# Patient Record
Sex: Male | Born: 1970
Health system: Southern US, Community
[De-identification: ages and names within clinical notes are randomized; demographics above are authoritative.]

## PROBLEM LIST (undated history)

## (undated) DIAGNOSIS — G629 Polyneuropathy, unspecified: Secondary | ICD-10-CM

## (undated) DIAGNOSIS — G4733 Obstructive sleep apnea (adult) (pediatric): Secondary | ICD-10-CM

## (undated) DIAGNOSIS — M1A00X0 Idiopathic chronic gout, unspecified site, without tophus (tophi): Secondary | ICD-10-CM

## (undated) DIAGNOSIS — M1A9XX0 Chronic gout, unspecified, without tophus (tophi): Secondary | ICD-10-CM

## (undated) DIAGNOSIS — I10 Essential (primary) hypertension: Secondary | ICD-10-CM

## (undated) DIAGNOSIS — Z9989 Dependence on other enabling machines and devices: Secondary | ICD-10-CM

## (undated) DIAGNOSIS — M1990 Unspecified osteoarthritis, unspecified site: Secondary | ICD-10-CM

## (undated) DIAGNOSIS — I509 Heart failure, unspecified: Secondary | ICD-10-CM

## (undated) HISTORY — DX: Chronic gout, unspecified, without tophus (tophi): M1A.9XX0

## (undated) HISTORY — DX: Idiopathic chronic gout, unspecified site, without tophus (tophi): M1A.00X0

## (undated) HISTORY — DX: Obstructive sleep apnea (adult) (pediatric): G47.33

## (undated) HISTORY — DX: Dependence on other enabling machines and devices: Z99.89

## (undated) HISTORY — DX: Unspecified osteoarthritis, unspecified site: M19.90

## (undated) HISTORY — DX: Polyneuropathy, unspecified: G62.9

---
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
## 1998-06-19 ENCOUNTER — Encounter: Payer: Self-pay | Admitting: Emergency Medicine

## 1998-06-19 ENCOUNTER — Emergency Department (HOSPITAL_COMMUNITY): Admission: EM | Admit: 1998-06-19 | Discharge: 1998-06-19 | Payer: Self-pay | Admitting: Emergency Medicine

## 1998-08-26 ENCOUNTER — Emergency Department (HOSPITAL_COMMUNITY): Admission: EM | Admit: 1998-08-26 | Discharge: 1998-08-26 | Payer: Self-pay | Admitting: Emergency Medicine

## 1998-09-10 ENCOUNTER — Ambulatory Visit (HOSPITAL_BASED_OUTPATIENT_CLINIC_OR_DEPARTMENT_OTHER): Admission: RE | Admit: 1998-09-10 | Discharge: 1998-09-10 | Payer: Self-pay | Admitting: General Surgery

## 2002-03-08 HISTORY — PX: I & D EXTREMITY: SHX5045

## 2004-11-17 ENCOUNTER — Ambulatory Visit (HOSPITAL_COMMUNITY): Admission: RE | Admit: 2004-11-17 | Discharge: 2004-11-17 | Payer: Self-pay | Admitting: General Surgery

## 2007-09-25 ENCOUNTER — Encounter (HOSPITAL_COMMUNITY): Admission: RE | Admit: 2007-09-25 | Discharge: 2007-10-25 | Payer: Self-pay | Admitting: Pediatrics

## 2008-03-08 HISTORY — PX: VASECTOMY: SHX75

## 2008-08-19 ENCOUNTER — Ambulatory Visit (HOSPITAL_BASED_OUTPATIENT_CLINIC_OR_DEPARTMENT_OTHER): Admission: RE | Admit: 2008-08-19 | Discharge: 2008-08-19 | Payer: Self-pay | Admitting: Urology

## 2008-08-19 ENCOUNTER — Encounter: Payer: Self-pay | Admitting: Urology

## 2010-06-15 LAB — POCT I-STAT 4, (NA,K, GLUC, HGB,HCT)
Glucose, Bld: 91 mg/dL (ref 70–99)
HCT: 48 % (ref 39.0–52.0)
Hemoglobin: 16.3 g/dL (ref 13.0–17.0)
Potassium: 4.1 mEq/L (ref 3.5–5.1)
Sodium: 140 mEq/L (ref 135–145)

## 2010-07-21 NOTE — Op Note (Signed)
Willie Daniel, Willie Daniel               ACCOUNT NO.:  [id number]   MEDICAL RECORD NO.:  [id number]          PATIENT TYPE:  AMB   LOCATION:  [facility]                         FACILITY:  [facility]   PHYSICIAN:  Bertram Millard. Dahlstedt, M.D.DATE OF BIRTH:  1971/01/18   DATE OF PROCEDURE:  08/19/2008  DATE OF DISCHARGE:                               OPERATIVE REPORT   PREOPERATIVE DIAGNOSIS:  Undesired fertility.   POSTOPERATIVE DIAGNOSIS:  Undesired fertility.   SURGICAL PROCEDURES:  Bilateral vasectomy.   SURGEON:  Bertram Millard. Dahlstedt, M.D.   ANESTHESIA:  General with LMA.   COMPLICATIONS:  None.   BRIEF HISTORY:  A 40 year old male who presented last week for vasectomy  consultation.  Exam revealed a very tight scrotum with his testicles up  in the inguinal canal.  It was recommended that he have this done under  general anesthetic, as it would be quite uncomfortable and a difficult  procedure in the office.  Risks and complications of the procedure, as  well as vasectomy were discussed with the patient.  He understands these  and desires to proceed.   DESCRIPTION OF PROCEDURE:  The patient was identified in the holding  area taken to the operating room where general anesthetic was  administered using the LMA.  Preoperative IV antibiotics had been  administered.  His genitalia and perineum were prepped and draped.  Both  vas were palpable.  The testicles were actually down on the top of the  scrotum.  I then made a small puncture wound in the middle of the  scrotum with no scalpel vasectomy hemostat.  I then placed the no  scalpel clamp through the small puncture wound and grasped the right vas  deferens.  It was brought through the small puncture wound.  The vas  deferens was skeletonized, for approximately 2 cm.  Both proximal and  distal extent of the vas was clamped.  The vas in between was then  removed.  Zero Vicryl sutures were used to ligate the vas proximally and  distally.  The  free ends were then buried in the dartos fascia using a 4-  0 chromic.  The same procedure was done with a left vas deferens after  the right side had been returned to the right hemiscrotum.  The proximal  and distal ends of both vasa were infiltrated with 0.5% plain Marcaine  prior to returning to the hemiscrotum.  After the left vas was  surgically ligated, both vasa were sent to pathology for gross only  inspection.  Following this, the small puncture wound was pinched shut,  and gauze was placed over the midline puncture site.  A scrotal support  was then placed.   The patient tolerated the procedure well.  He was awakened and taken to  the PACU in stable condition.   He will follow-up in approximately 1 week.      Bertram Millard. Dahlstedt, M.D.  Electronically Signed     SMD/MEDQ  D:  08/19/2008  T:  08/19/2008  Job:  [id number]

## 2011-10-12 DIAGNOSIS — Q203 Discordant ventriculoarterial connection: Secondary | ICD-10-CM | POA: Insufficient documentation

## 2012-12-05 ENCOUNTER — Other Ambulatory Visit (HOSPITAL_COMMUNITY): Payer: Self-pay | Admitting: Orthopedic Surgery

## 2012-12-05 DIAGNOSIS — M25562 Pain in left knee: Secondary | ICD-10-CM

## 2012-12-07 ENCOUNTER — Ambulatory Visit (HOSPITAL_COMMUNITY)
Admission: RE | Admit: 2012-12-07 | Discharge: 2012-12-07 | Disposition: A | Discharge status: Home / Self Care | Payer: BC Managed Care – PPO | Source: Ambulatory Visit | Attending: Orthopedic Surgery | Admitting: Orthopedic Surgery

## 2012-12-07 DIAGNOSIS — M25562 Pain in left knee: Secondary | ICD-10-CM

## 2012-12-07 DIAGNOSIS — M7120 Synovial cyst of popliteal space [Baker], unspecified knee: Secondary | ICD-10-CM | POA: Insufficient documentation

## 2012-12-07 DIAGNOSIS — M2240 Chondromalacia patellae, unspecified knee: Secondary | ICD-10-CM | POA: Insufficient documentation

## 2012-12-07 DIAGNOSIS — M23329 Other meniscus derangements, posterior horn of medial meniscus, unspecified knee: Secondary | ICD-10-CM | POA: Insufficient documentation

## 2012-12-07 DIAGNOSIS — M25469 Effusion, unspecified knee: Secondary | ICD-10-CM | POA: Insufficient documentation

## 2012-12-07 DIAGNOSIS — M25569 Pain in unspecified knee: Secondary | ICD-10-CM | POA: Insufficient documentation

## 2012-12-12 ENCOUNTER — Encounter (HOSPITAL_BASED_OUTPATIENT_CLINIC_OR_DEPARTMENT_OTHER): Payer: Self-pay | Admitting: *Deleted

## 2012-12-12 NOTE — Progress Notes (Signed)
Pt had heart surgery age 52goes [facility] yearly for ck Had labs 2 weeks ago [facility]- Denies sleep apnea

## 2012-12-13 NOTE — H&P (Signed)
Eulogio Requena/[facility] [address]   [address] [place], [place] [address] [phone] A Division of [facility]  Loreta Ave, M.D.   Robert A. Thurston Hole, M.D.   Burnell Blanks, M.D.   Eulas Post, M.D.   Lunette Stands, M.D Jewel Baize. Eulah Pont, M.D.  Buford Dresser, M.D.  Charlsie Quest, M.D.  Estell Harpin, M.D.   Melina Fiddler, M.D. Danford Bad. Willa Rough, PA-C  Kirstin A. Shepperson, PA-C  Josh [place], PA-C [place], [place]   RE: Willie Daniel, Willie Daniel   [id number]      DOB: 07-15-1970 PROGRESS NOTE: 12-05-12 Willie Daniel is a 42 year old male who presents with the complaint of left knee pain.  He describes the onset as several years ago with intermittent exacerbations of pain.  He had one over the weekend for which [facility].  He reports his pain as ranging from 4-10/10 on the pain scale.  He has been taking Norco for pain relief.  He feels that he has had several corticosteroid injections in the past which have gradually improved his pain; however, it continues to recur on somewhat of a random basis.  He had a fair amount of swelling which subsequently subsided.    Past Medical, Family and Social History are reviewed on the chart. His Review of Systems as per HPI; otherwise negative. He has no known drug allergies.    EXAMINATION: This is a well-nourished, well-developed, 42 year old white male, awake, alert and oriented in no acute distress.  His blood pressure 120/81, P 75.  Examination of the left knee reveals point tenderness over the medial joint line with no evidence of effusion.  He has a positive McMurray's.  No evidence of ligamentous laxity to varus or valgus stress.  He has a negative Lachman and negative anterior Drawer.  X-RAYS: 4 view x-rays of the left knee are negative.    IMPRESSION: Probable left medial meniscal injury.   DISPOSITION: MRI is being ordered.  He will follow-up  after the MRI for further evaluation.   Loreta Ave, M.D.  Electronically verified by Loreta Ave, M.D. DFM(TM):gde D 12-05-12 T 12-05-12  Jawad Wiacek/[facility] [address]   [address] [place], [place] [address] [phone] A Division of [facility]  Loreta Ave, M.D.   Robert A. Thurston Hole, M.D.   Burnell Blanks, M.D.   Eulas Post, M.D.   Lunette Stands, M.D Jewel Baize. Eulah Pont, M.D.  Buford Dresser, M.D.  Charlsie Quest, M.D.  Estell Harpin, M.D.   Melina Fiddler, M.D. Danford Bad. Willa Rough, MD.  Kirstin A. Shepperson, PA-C  Josh Cass[place]aga, PA-C [place], [place]   RE: Willie Daniel, Willie Daniel   [id number]      DOB: 01/31/1971 PROGRESS NOTE: 12-12-12 Willie Daniel comes in for follow-up. Left knee MRI complete. Persistent worsening symptoms. MRI showing medial meniscus tear. This is where his symptoms are. I went through workup and treatment to date. I went over the MRI report and scan itself and discussed it with him.  DISPOSITION: More than 25 minutes spent covering this with him. Proceed with exam under anesthesia arthroscopy meniscectomy. I am hoping we are not going to find much in the way of other changes. He understands and agrees and wants to proceed. All paperwork complete all questions answered.  Loreta Ave, M.D.  Electronically verified by Loreta Ave, M.D. DFM:kah D 12-12-12 T 12-13-12

## 2012-12-14 ENCOUNTER — Encounter (HOSPITAL_BASED_OUTPATIENT_CLINIC_OR_DEPARTMENT_OTHER)
Admission: RE | Disposition: A | Discharge status: Home / Self Care | Payer: Self-pay | Source: Ambulatory Visit | Attending: Orthopedic Surgery

## 2012-12-14 ENCOUNTER — Ambulatory Visit (HOSPITAL_BASED_OUTPATIENT_CLINIC_OR_DEPARTMENT_OTHER): Payer: BC Managed Care – PPO | Admitting: Anesthesiology

## 2012-12-14 ENCOUNTER — Encounter (HOSPITAL_BASED_OUTPATIENT_CLINIC_OR_DEPARTMENT_OTHER): Payer: Self-pay | Admitting: *Deleted

## 2012-12-14 ENCOUNTER — Ambulatory Visit (HOSPITAL_BASED_OUTPATIENT_CLINIC_OR_DEPARTMENT_OTHER)
Admission: RE | Admit: 2012-12-14 | Discharge: 2012-12-14 | Disposition: A | Discharge status: Home / Self Care | Payer: BC Managed Care – PPO | Source: Ambulatory Visit | Attending: Orthopedic Surgery | Admitting: Orthopedic Surgery

## 2012-12-14 ENCOUNTER — Encounter (HOSPITAL_BASED_OUTPATIENT_CLINIC_OR_DEPARTMENT_OTHER): Payer: BC Managed Care – PPO | Admitting: Anesthesiology

## 2012-12-14 DIAGNOSIS — M25562 Pain in left knee: Secondary | ICD-10-CM

## 2012-12-14 DIAGNOSIS — M23329 Other meniscus derangements, posterior horn of medial meniscus, unspecified knee: Secondary | ICD-10-CM | POA: Insufficient documentation

## 2012-12-14 DIAGNOSIS — M1A00X1 Idiopathic chronic gout, unspecified site, with tophus (tophi): Secondary | ICD-10-CM | POA: Insufficient documentation

## 2012-12-14 DIAGNOSIS — I10 Essential (primary) hypertension: Secondary | ICD-10-CM | POA: Insufficient documentation

## 2012-12-14 DIAGNOSIS — M23349 Other meniscus derangements, anterior horn of lateral meniscus, unspecified knee: Secondary | ICD-10-CM | POA: Insufficient documentation

## 2012-12-14 HISTORY — DX: Essential (primary) hypertension: I10

## 2012-12-14 HISTORY — PX: KNEE ARTHROSCOPY WITH MEDIAL MENISECTOMY: SHX5651

## 2012-12-14 LAB — POCT I-STAT, CHEM 8
Calcium, Ion: 1.14 mmol/L (ref 1.12–1.23)
Creatinine, Ser: 1 mg/dL (ref 0.50–1.35)
Glucose, Bld: 96 mg/dL (ref 70–99)
Hemoglobin: 14.6 g/dL (ref 13.0–17.0)
Potassium: 3.9 mEq/L (ref 3.5–5.1)

## 2012-12-14 SURGERY — ARTHROSCOPY, KNEE, WITH MEDIAL MENISCECTOMY
Anesthesia: General | Site: Knee | Laterality: Left | Wound class: Clean

## 2012-12-14 MED ORDER — DEXTROSE 5 % IV SOLN
3.0000 g | INTRAVENOUS | Status: AC
  Administered 2012-12-14: 3 g via INTRAVENOUS

## 2012-12-14 MED ORDER — MIDAZOLAM HCL 2 MG/2ML IJ SOLN
0.5000 mg | Freq: Once | INTRAMUSCULAR | Status: AC | PRN
  Administered 2012-12-14: 0.5 mg via INTRAVENOUS

## 2012-12-14 MED ORDER — METHYLPREDNISOLONE ACETATE 80 MG/ML IJ SUSP
INTRAMUSCULAR | Status: DC | PRN
  Administered 2012-12-14: 80 mg via INTRA_ARTICULAR

## 2012-12-14 MED ORDER — OXYCODONE-ACETAMINOPHEN 10-325 MG PO TABS: 1.0000 | ORAL_TABLET | ORAL | Status: DC | PRN

## 2012-12-14 MED ORDER — ACETAMINOPHEN 500 MG PO TABS
1000.0000 mg | ORAL_TABLET | Freq: Once | ORAL | Status: AC
  Administered 2012-12-14: 1000 mg via ORAL

## 2012-12-14 MED ORDER — OXYCODONE HCL 5 MG PO TABS
5.0000 mg | ORAL_TABLET | Freq: Once | ORAL | Status: AC | PRN
  Administered 2012-12-14: 5 mg via ORAL

## 2012-12-14 MED ORDER — MIDAZOLAM HCL 5 MG/5ML IJ SOLN
INTRAMUSCULAR | Status: DC | PRN
  Administered 2012-12-14: 2 mg via INTRAVENOUS

## 2012-12-14 MED ORDER — LIDOCAINE HCL (CARDIAC) 20 MG/ML IV SOLN
INTRAVENOUS | Status: DC | PRN
  Administered 2012-12-14: 100 mg via INTRAVENOUS

## 2012-12-14 MED ORDER — ONDANSETRON HCL 4 MG/2ML IJ SOLN
INTRAMUSCULAR | Status: DC | PRN
  Administered 2012-12-14: 4 mg via INTRAMUSCULAR

## 2012-12-14 MED ORDER — DEXAMETHASONE SODIUM PHOSPHATE 4 MG/ML IJ SOLN
INTRAMUSCULAR | Status: DC | PRN
  Administered 2012-12-14: 10 mg via INTRAVENOUS

## 2012-12-14 MED ORDER — OXYCODONE HCL 5 MG/5ML PO SOLN: 5.0000 mg | Freq: Once | ORAL | Status: AC | PRN

## 2012-12-14 MED ORDER — HYDROMORPHONE HCL PF 1 MG/ML IJ SOLN
0.2500 mg | INTRAMUSCULAR | Status: DC | PRN
  Administered 2012-12-14 (×3): 0.5 mg via INTRAVENOUS

## 2012-12-14 MED ORDER — SODIUM CHLORIDE 0.9 % IR SOLN
Status: DC | PRN
  Administered 2012-12-14: 6000 mL

## 2012-12-14 MED ORDER — FENTANYL CITRATE 0.05 MG/ML IJ SOLN
INTRAMUSCULAR | Status: DC | PRN
  Administered 2012-12-14: 50 ug via INTRAVENOUS

## 2012-12-14 MED ORDER — BUPIVACAINE HCL (PF) 0.5 % IJ SOLN
INTRAMUSCULAR | Status: DC | PRN
  Administered 2012-12-14: 25 mL

## 2012-12-14 MED ORDER — PROPOFOL 10 MG/ML IV BOLUS
INTRAVENOUS | Status: DC | PRN
  Administered 2012-12-14: 200 mg via INTRAVENOUS

## 2012-12-14 MED ORDER — KETOROLAC TROMETHAMINE 30 MG/ML IJ SOLN
INTRAMUSCULAR | Status: DC | PRN
  Administered 2012-12-14: 30 mg via INTRAVENOUS

## 2012-12-14 MED ORDER — DEXTROSE-NACL 5-0.45 % IV SOLN: INTRAVENOUS | Status: DC

## 2012-12-14 MED ORDER — CHLORHEXIDINE GLUCONATE 4 % EX LIQD: 60.0000 mL | Freq: Once | CUTANEOUS | Status: DC

## 2012-12-14 MED ORDER — LACTATED RINGERS IV SOLN
INTRAVENOUS | Status: DC
  Administered 2012-12-14: 11:00:00 via INTRAVENOUS

## 2012-12-14 SURGICAL SUPPLY — 37 items
BANDAGE ELASTIC 6 VELCRO ST LF (GAUZE/BANDAGES/DRESSINGS) ×2 IMPLANT
BLADE CUDA 5.5 (BLADE) IMPLANT
BLADE CUDA GRT WHITE 3.5 (BLADE) IMPLANT
BLADE CUTTER GATOR 3.5 (BLADE) ×2 IMPLANT
BLADE CUTTER MENIS 5.5 (BLADE) IMPLANT
BLADE GREAT WHITE 4.2 (BLADE) ×2 IMPLANT
BUR OVAL 4.0 (BURR) IMPLANT
CANISTER OMNI JUG 16 LITER (MISCELLANEOUS) ×2 IMPLANT
CANISTER SUCTION 2500CC (MISCELLANEOUS) IMPLANT
CLOTH BEACON ORANGE TIMEOUT ST (SAFETY) ×2 IMPLANT
CUTTER MENISCUS  4.2MM (BLADE)
CUTTER MENISCUS 4.2MM (BLADE) IMPLANT
DRAPE ARTHROSCOPY W/POUCH 90 (DRAPES) ×2 IMPLANT
DURAPREP 26ML APPLICATOR (WOUND CARE) ×2 IMPLANT
ELECT MENISCUS 165MM 90D (ELECTRODE) IMPLANT
ELECT REM PT RETURN 9FT ADLT (ELECTROSURGICAL)
ELECTRODE REM PT RTRN 9FT ADLT (ELECTROSURGICAL) IMPLANT
GAUZE XEROFORM 1X8 LF (GAUZE/BANDAGES/DRESSINGS) ×2 IMPLANT
GLOVE BIO SURGEON STRL SZ8 (GLOVE) ×2 IMPLANT
GLOVE BIOGEL PI IND STRL 8.5 (GLOVE) ×1 IMPLANT
GLOVE BIOGEL PI INDICATOR 8.5 (GLOVE) ×1
GLOVE ORTHO TXT STRL SZ7.5 (GLOVE) ×2 IMPLANT
GOWN PREVENTION PLUS XLARGE (GOWN DISPOSABLE) ×2 IMPLANT
GOWN STRL REIN 2XL XLG LVL4 (GOWN DISPOSABLE) ×2 IMPLANT
HOLDER KNEE FOAM BLUE (MISCELLANEOUS) ×2 IMPLANT
IV NS IRRIG 3000ML ARTHROMATIC (IV SOLUTION) ×4 IMPLANT
KNEE WRAP E Z 3 GEL PACK (MISCELLANEOUS) ×2 IMPLANT
PACK ARTHROSCOPY DSU (CUSTOM PROCEDURE TRAY) ×2 IMPLANT
PACK BASIN DAY SURGERY FS (CUSTOM PROCEDURE TRAY) ×2 IMPLANT
PENCIL BUTTON HOLSTER BLD 10FT (ELECTRODE) IMPLANT
SET ARTHROSCOPY TUBING (MISCELLANEOUS) ×2
SET ARTHROSCOPY TUBING LN (MISCELLANEOUS) ×1 IMPLANT
SPONGE GAUZE 4X4 12PLY (GAUZE/BANDAGES/DRESSINGS) ×4 IMPLANT
SUT ETHILON 3 0 PS 1 (SUTURE) ×2 IMPLANT
SUT VIC AB 3-0 FS2 27 (SUTURE) IMPLANT
TOWEL OR 17X24 6PK STRL BLUE (TOWEL DISPOSABLE) ×2 IMPLANT
WATER STERILE IRR 1000ML POUR (IV SOLUTION) ×2 IMPLANT

## 2012-12-14 NOTE — Interval H&P Note (Signed)
History and Physical Interval Note:  12/14/2012 7:30 AM  Willie Daniel  has presented today for surgery, with the diagnosis of MEDIAL MENISCUS TEAR LEFT KNEE  The various methods of treatment have been discussed with the patient and family. After consideration of risks, benefits and other options for treatment, the patient has consented to  Procedure(s): KNEE ARTHROSCOPY WITH MEDIAL MENISECTOMY (Left) as a surgical intervention .  The patient's history has been reviewed, patient examined, no change in status, stable for surgery.  I have reviewed the patient's chart and labs.  Questions were answered to the patient's satisfaction.     Naftula Donahue F

## 2012-12-14 NOTE — Discharge Instructions (Signed)
°  Post Anesthesia Home Care Instructions  Activity: Get plenty of rest for the remainder of the day. A responsible adult should stay with you for 24 hours following the procedure.  For the next 24 hours, DO NOT: -Drive a car -Advertising copywriter -Drink alcoholic beverages -Take any medication unless instructed by your physician -Make any legal decisions or sign important papers.  Meals: Start with liquid foods such as gelatin or soup. Progress to regular foods as tolerated. Avoid greasy, spicy, heavy foods. If nausea and/or vomiting occur, drink only clear liquids until the nausea and/or vomiting subsides. Call your physician if vomiting continues.  Special Instructions/Symptoms: Your throat may feel dry or sore from the anesthesia or the breathing tube placed in your throat during surgery. If this causes discomfort, gargle with warm salt water. The discomfort should disappear within 24 hours.     Knee arthroscopy Care After Refer to this sheet in the next few weeks. These discharge instructions provide you with general information on caring for yourself after you leave the hospital. Your caregiver may also give you specific instructions. Your treatment has been planned according to the most current medical practices available, but unavoidable complications sometimes occur. If you have any problems or questions after discharge, please call your caregiver.  HOME INSTRUCTIONS  Remove dressing 48 hours postop, shower and apply bandaids to incisions.  Begin knee range of motion exercises.  Weightbear as tolerated with crutches and wean off as comfort allows.  Elevate foot above heart level as much as possible.   Avoid lifting or driving until you are instructed otherwise.  Make an appointment to see your caregiver for stitches (suture) or staple removal as directed.   SEEK MEDICAL CARE IF: You have swelling of your calf or leg.  You develop shortness of breath or chest pain.  You have  redness, swelling, or increasing pain in the wound.  There is pus or any unusual drainage coming from the surgical site.  You notice a bad smell coming from the surgical site or dressing.  The surgical site breaks open after sutures or staples have been removed.  There is persistent bleeding from the suture or staple line.  You are getting worse or are not improving.  You have any other questions or concerns.  SEEK IMMEDIATE MEDICAL CARE IF:  You have a fever.  You develop a rash.  You have difficulty breathing.  You develop any reaction or side effects to medicines given.  MAKE SURE YOU:  Understand these instructions.  Will watch your condition.  Will get help right away if you are not doing well or get worse.

## 2012-12-14 NOTE — Anesthesia Preprocedure Evaluation (Signed)
Anesthesia Evaluation  Patient identified by MRN, date of birth, ID band Patient awake    Reviewed: Allergy & Precautions, H&P , NPO status , Patient's Chart, lab work & pertinent test results, reviewed documented beta blocker date and time   Airway Mallampati: III TM Distance: >3 FB Neck ROM: Full    Dental no notable dental hx. 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 Congenitally corrected S/p VSD repair at age 42 No cardiac complaints at this time  Followed at [facility]   Neuro/Psych negative neurological ROS  negative psych ROS   GI/Hepatic negative GI ROS, Neg liver ROS,   Endo/Other  negative endocrine ROS  Renal/GU negative Renal ROS  negative genitourinary   Musculoskeletal   Abdominal   Peds  Hematology negative hematology ROS (+)   Anesthesia Other Findings   Reproductive/Obstetrics negative OB ROS                           Anesthesia Physical Anesthesia Plan  ASA: II  Anesthesia Plan: General   Post-op Pain Management:    Induction: Intravenous  Airway Management Planned: LMA  Additional Equipment:   Intra-op Plan:   Post-operative Plan: Extubation in OR  Informed Consent: I have reviewed the patients History and Physical, chart, labs and discussed the procedure including the risks, benefits and alternatives for the proposed anesthesia with the patient or authorized representative who has indicated his/her understanding and acceptance.   Dental advisory given  Plan Discussed with: CRNA  Anesthesia Plan Comments:         Anesthesia Quick Evaluation

## 2012-12-14 NOTE — Anesthesia Postprocedure Evaluation (Signed)
  Anesthesia Post-op Note  Patient: Willie Daniel  Procedure(s) Performed: Procedure(s): KNEE ARTHROSCOPY WITH PARTIAL MEDIAL AND LATERAL MENISECTOMY EXTENSIVE SYNOVECTOMY REMOVAL NODULE LEFT KNEE (Left)  Patient Location: PACU  Anesthesia Type:General  Level of Consciousness: awake, alert  and oriented  Airway and Oxygen Therapy: Patient Spontanous Breathing  Post-op Pain: mild  Post-op Assessment: Post-op Vital signs reviewed, Patient's Cardiovascular Status Stable, Respiratory Function Stable, Patent Airway and No signs of Nausea or vomiting  Post-op Vital Signs: Reviewed and stable  Complications: No apparent anesthesia complications

## 2012-12-14 NOTE — Anesthesia Procedure Notes (Signed)
Procedure Name: LMA Insertion Date/Time: 12/14/2012 11:29 AM Performed by: Jessica Priest Pre-anesthesia Checklist: Patient identified, Emergency Drugs available, Suction available and Patient being monitored Patient Re-evaluated:Patient Re-evaluated prior to inductionOxygen Delivery Method: Circle System Utilized Preoxygenation: Pre-oxygenation with 100% oxygen Intubation Type: IV induction Ventilation: Mask ventilation without difficulty LMA: LMA with gastric port inserted LMA Size: 4.0 Number of attempts: 1 Airway Equipment and Method: bite block Placement Confirmation: positive ETCO2 Tube secured with: Tape Dental Injury: Teeth and Oropharynx as per pre-operative assessment

## 2012-12-14 NOTE — Transfer of Care (Signed)
Immediate Anesthesia Transfer of Care Note  Patient: Willie Daniel  Procedure(s) Performed: Procedure(s): KNEE ARTHROSCOPY WITH PARTIAL MEDIAL AND LATERAL MENISECTOMY EXTENSIVE SYNOVECTOMY REMOVAL NODULE LEFT KNEE (Left)  Patient Location: PACU  Anesthesia Type:General  Level of Consciousness: awake, alert  and oriented  Airway & Oxygen Therapy: Patient Spontanous Breathing and Patient connected to face mask oxygen  Post-op Assessment: Report given to PACU RN and Post -op Vital signs reviewed and stable  Post vital signs: Reviewed and stable  Complications: No apparent anesthesia complications

## 2012-12-15 ENCOUNTER — Encounter (HOSPITAL_BASED_OUTPATIENT_CLINIC_OR_DEPARTMENT_OTHER): Payer: Self-pay | Admitting: Orthopedic Surgery

## 2012-12-15 NOTE — Op Note (Signed)
NAME:  Daniel, Willie               ACCOUNT NO.:  [id number]  MEDICAL RECORD NO.:  [id number]  LOCATION:                               FACILITY:  [facility]  PHYSICIAN:  Noreta Kue F. Tylasia Fletchall, M.D. DATE OF BIRTH:  07/31/1970  DATE OF PROCEDURE:  12/14/2012 DATE OF DISCHARGE:  12/14/2012                              OPERATIVE REPORT   PREOPERATIVE DIAGNOSIS:  Left knee medial meniscus tear.  POSTOPERATIVE DIAGNOSES:  Left knee undersurface tear of medial meniscus posterior third.  Marked destruction of the anterior half lateral meniscus.  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  Evidence of previous cortisone injection as well.  PROCEDURE:  Left knee exam under anesthesia, arthroscopy.  Debridement, biopsy of nodular deposits that looked like tophaceous gout.  Extensive synovectomy and debridement of all these throughout the knee.  Partial medial and lateral meniscectomy.  SURGEON:  Marrion Finan F. Sophi Calligan, M.D.  ASSISTANT:  Bradley Hicks, PA.  ANESTHESIA:  General.  BLOOD LOSS:  Minimal.  SPECIMENS:  Excised crystalline deposits to be identified as it was not allowed for gout.  CULTURES:  None.  COMPLICATIONS:  None.  DRESSINGS:  Soft compressive.  PROCEDURE:  The patient was brought to operating room, placed on the operating table in a supine position.  After an adequate level of anesthesia had been obtained, leg holder applied.  Leg prepped and draped in usual sterile fashion.  Two portals, one each medial and lateral parapatellar.  Arthroscope introduced, knee distended and inspected.  Posttraumatic initial finding was just crystalline deposits throughout.  I initially thought this was left over from cortisone and part of it may well have been, but in the front of the knee in the notch there were 2 large nodules of what like tophaceous gout.  This invaded and destroyed the anterior half lateral meniscus.  Those  deposits were biopsied and sent to see if there were definitely gout.  I then did an extensive tricompartmental synovectomy debriding all inflammatory abnormal tissue.  Articular cartilage did not look bad.  Patellofemoral joint good tracking.  ACL intact.  Medial meniscus had large undersurface tear away from the rim posterior third taken out to a stable rim, tapered into remaining meniscus with baskets and shaver. Laterally, I had to remove the anterior half lateral meniscus, which was completely destroyed by these tophaceous gouty deposits.  They were either gout or pseudogout.  Posterior half retained.  Entire knee examined.  No other findings were appreciated.  Instruments were fully removed.  Portals were closed with nylon, injected with Marcaine.  Knee injected with Depo- Medrol and Marcaine.  Anesthesia reversed.  Brought to the recovery room.  Tolerated the surgery well.  No complications.     Ramzy Cappelletti F. Armstrong Creasy, M.D.     DFM/MEDQ  D:  12/14/2012  T:  12/15/2012  Job:  [id number] 

## 2012-12-15 NOTE — Op Note (Deleted)
Willie Daniel, [name]:  [id number]  MEDICAL RECORD NO.:  [id number]  LOCATION:                               FACILITY:  [facility]  PHYSICIAN:  Loreta Ave, M.D. DATE OF BIRTH:  May 07, 1970  DATE OF PROCEDURE:  12/14/2012 DATE OF DISCHARGE:  12/14/2012                              OPERATIVE REPORT   PREOPERATIVE DIAGNOSIS:  Left knee medial meniscus tear.  POSTOPERATIVE DIAGNOSES:  Left knee undersurface tear of medial meniscus posterior third.  Marked destruction of the anterior half lateral meniscus.  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  Evidence of previous cortisone injection as well.  PROCEDURE:  Left knee exam under anesthesia, arthroscopy.  Debridement, biopsy of nodular deposits that looked like tophaceous gout.  Extensive synovectomy and debridement of all these throughout the knee.  Partial medial and lateral meniscectomy.  SURGEON:  Loreta Ave, M.D.  ASSISTANT:  Domingo Cocking, PA.  ANESTHESIA:  General.  BLOOD LOSS:  Minimal.  SPECIMENS:  Excised crystalline deposits to be identified as it was not allowed for gout.  CULTURES:  None.  COMPLICATIONS:  None.  DRESSINGS:  Soft compressive.  PROCEDURE:  The patient was brought to operating room, placed on the operating table in a supine position.  After an adequate level of anesthesia had been obtained, leg holder applied.  Leg prepped and draped in usual sterile fashion.  Two portals, one each medial and lateral parapatellar.  Arthroscope introduced, knee distended and inspected.  Posttraumatic initial finding was just crystalline deposits throughout.  I initially thought this was left over from cortisone and part of it may well have been, but in the front of the knee in the notch there were 2 large nodules of what like tophaceous gout.  This invaded and destroyed the anterior half lateral meniscus.  Those  deposits were biopsied and sent to see if there were definitely gout.  I then did an extensive tricompartmental synovectomy debriding all inflammatory abnormal tissue.  Articular cartilage did not look bad.  Patellofemoral joint good tracking.  ACL intact.  Medial meniscus had large undersurface tear away from the rim posterior third taken out to a stable rim, tapered into remaining meniscus with baskets and shaver. Laterally, I had to remove the anterior half lateral meniscus, which was completely destroyed by these tophaceous gouty deposits.  They were either gout or pseudogout.  Posterior half retained.  Entire knee examined.  No other findings were appreciated.  Instruments were fully removed.  Portals were closed with nylon, injected with Marcaine.  Knee injected with Depo- Medrol and Marcaine.  Anesthesia reversed.  Brought to the recovery room.  Tolerated the surgery well.  No complications.     Loreta Ave, M.D.     DFM/MEDQ  D:  12/14/2012  T:  12/15/2012  Job:  [id number]

## 2015-01-21 ENCOUNTER — Other Ambulatory Visit: Payer: Self-pay | Admitting: Orthopedic Surgery

## 2015-01-21 DIAGNOSIS — M25512 Pain in left shoulder: Secondary | ICD-10-CM

## 2015-02-05 ENCOUNTER — Other Ambulatory Visit: Payer: Self-pay | Admitting: Orthopedic Surgery

## 2015-02-05 ENCOUNTER — Ambulatory Visit
Admission: RE | Admit: 2015-02-05 | Discharge: 2015-02-05 | Disposition: A | Discharge status: Home / Self Care | Payer: BLUE CROSS/BLUE SHIELD | Source: Ambulatory Visit | Attending: Orthopedic Surgery | Admitting: Orthopedic Surgery

## 2015-02-05 DIAGNOSIS — Z139 Encounter for screening, unspecified: Secondary | ICD-10-CM

## 2015-02-05 DIAGNOSIS — M25512 Pain in left shoulder: Secondary | ICD-10-CM

## 2015-04-10 ENCOUNTER — Observation Stay (HOSPITAL_COMMUNITY)
Admission: EM | Admit: 2015-04-10 | Discharge: 2015-04-11 | Disposition: A | Discharge status: Home / Self Care | Payer: BLUE CROSS/BLUE SHIELD | Attending: Interventional Cardiology | Admitting: Interventional Cardiology

## 2015-04-10 ENCOUNTER — Encounter (HOSPITAL_COMMUNITY): Payer: Self-pay

## 2015-04-10 ENCOUNTER — Emergency Department (HOSPITAL_COMMUNITY): Payer: BLUE CROSS/BLUE SHIELD

## 2015-04-10 DIAGNOSIS — Z79899 Other long term (current) drug therapy: Secondary | ICD-10-CM | POA: Diagnosis not present

## 2015-04-10 DIAGNOSIS — I10 Essential (primary) hypertension: Secondary | ICD-10-CM | POA: Insufficient documentation

## 2015-04-10 DIAGNOSIS — Q203 Discordant ventriculoarterial connection: Secondary | ICD-10-CM

## 2015-04-10 DIAGNOSIS — I4892 Unspecified atrial flutter: Secondary | ICD-10-CM | POA: Diagnosis present

## 2015-04-10 DIAGNOSIS — Z7982 Long term (current) use of aspirin: Secondary | ICD-10-CM | POA: Diagnosis not present

## 2015-04-10 DIAGNOSIS — Q240 Dextrocardia: Secondary | ICD-10-CM | POA: Diagnosis not present

## 2015-04-10 DIAGNOSIS — I483 Typical atrial flutter: Secondary | ICD-10-CM

## 2015-04-10 DIAGNOSIS — I471 Supraventricular tachycardia: Secondary | ICD-10-CM | POA: Diagnosis not present

## 2015-04-10 DIAGNOSIS — R5383 Other fatigue: Secondary | ICD-10-CM | POA: Insufficient documentation

## 2015-04-10 DIAGNOSIS — I484 Atypical atrial flutter: Secondary | ICD-10-CM

## 2015-04-10 DIAGNOSIS — R002 Palpitations: Secondary | ICD-10-CM | POA: Diagnosis present

## 2015-04-10 LAB — CBC WITH DIFFERENTIAL/PLATELET
BASOS PCT: 0 %
Basophils Absolute: 0 10*3/uL (ref 0.0–0.1)
EOS ABS: 0.1 10*3/uL (ref 0.0–0.7)
Eosinophils Relative: 1 %
HCT: 45.8 % (ref 39.0–52.0)
HEMOGLOBIN: 14.9 g/dL (ref 13.0–17.0)
Lymphocytes Relative: 24 %
Lymphs Abs: 1.9 10*3/uL (ref 0.7–4.0)
MCH: 31 pg (ref 26.0–34.0)
MCHC: 32.5 g/dL (ref 30.0–36.0)
MCV: 95.4 fL (ref 78.0–100.0)
Monocytes Absolute: 0.4 10*3/uL (ref 0.1–1.0)
Monocytes Relative: 6 %
Neutro Abs: 5.4 10*3/uL (ref 1.7–7.7)
Neutrophils Relative %: 69 %
Platelets: 267 10*3/uL (ref 150–400)
RBC: 4.8 MIL/uL (ref 4.22–5.81)
RDW: 13.6 % (ref 11.5–15.5)
WBC: 7.8 10*3/uL (ref 4.0–10.5)

## 2015-04-10 LAB — I-STAT TROPONIN, ED: TROPONIN I, POC: 0.06 ng/mL (ref 0.00–0.08)

## 2015-04-10 LAB — BASIC METABOLIC PANEL
Anion gap: 11 (ref 5–15)
BUN: 13 mg/dL (ref 6–20)
CHLORIDE: 105 mmol/L (ref 101–111)
CO2: 23 mmol/L (ref 22–32)
CREATININE: 1.38 mg/dL — AB (ref 0.61–1.24)
Calcium: 9.2 mg/dL (ref 8.9–10.3)
GFR calc non Af Amer: 60 mL/min — ABNORMAL LOW (ref >60)
Glucose, Bld: 148 mg/dL — ABNORMAL HIGH (ref 65–99)
POTASSIUM: 5.1 mmol/L (ref 3.5–5.1)
SODIUM: 139 mmol/L (ref 135–145)

## 2015-04-10 LAB — MRSA PCR SCREENING: MRSA BY PCR: NEGATIVE

## 2015-04-10 LAB — PROTIME-INR
INR: 1.11 (ref 0.00–1.49)
Prothrombin Time: 14.5 seconds (ref 11.6–15.2)

## 2015-04-10 LAB — TSH: TSH: 2.471 u[IU]/mL (ref 0.350–4.500)

## 2015-04-10 LAB — TROPONIN I: TROPONIN I: 0.19 ng/mL — AB (ref <0.031)

## 2015-04-10 LAB — T4, FREE: FREE T4: 0.76 ng/dL (ref 0.61–1.12)

## 2015-04-10 MED ORDER — LORAZEPAM 2 MG/ML IJ SOLN: 2.0000 mg | INTRAMUSCULAR | Status: DC | PRN

## 2015-04-10 MED ORDER — ENOXAPARIN SODIUM 150 MG/ML ~~LOC~~ SOLN
130.0000 mg | Freq: Once | SUBCUTANEOUS | Status: AC
  Administered 2015-04-10: 130 mg via SUBCUTANEOUS
  Filled 2015-04-10: qty 0.87

## 2015-04-10 MED ORDER — ONDANSETRON HCL 4 MG/2ML IJ SOLN: 4.0000 mg | Freq: Four times a day (QID) | INTRAMUSCULAR | Status: DC | PRN

## 2015-04-10 MED ORDER — OXYCODONE-ACETAMINOPHEN 10-325 MG PO TABS: 1.0000 | ORAL_TABLET | ORAL | Status: DC | PRN

## 2015-04-10 MED ORDER — ADENOSINE 6 MG/2ML IV SOLN
INTRAVENOUS | Status: AC
  Filled 2015-04-10: qty 4

## 2015-04-10 MED ORDER — METOPROLOL TARTRATE 1 MG/ML IV SOLN
INTRAVENOUS | Status: AC
  Filled 2015-04-10: qty 5

## 2015-04-10 MED ORDER — ACETAMINOPHEN 325 MG PO TABS: 650.0000 mg | ORAL_TABLET | ORAL | Status: DC | PRN

## 2015-04-10 MED ORDER — LABETALOL HCL 5 MG/ML IV SOLN
20.0000 mg | Freq: Once | INTRAVENOUS | Status: DC
  Filled 2015-04-10: qty 4

## 2015-04-10 MED ORDER — ENOXAPARIN SODIUM 150 MG/ML ~~LOC~~ SOLN
130.0000 mg | Freq: Two times a day (BID) | SUBCUTANEOUS | Status: DC
  Administered 2015-04-10 – 2015-04-11 (×2): 130 mg via SUBCUTANEOUS
  Filled 2015-04-10: qty 1
  Filled 2015-04-10: qty 0.87

## 2015-04-10 MED ORDER — ADENOSINE 6 MG/2ML IV SOLN
INTRAVENOUS | Status: AC | PRN
  Administered 2015-04-10 (×2): 12 mg via INTRAVENOUS
  Administered 2015-04-10: 6 mg via INTRAVENOUS

## 2015-04-10 MED ORDER — OXYCODONE-ACETAMINOPHEN 5-325 MG PO TABS: 1.0000 | ORAL_TABLET | ORAL | Status: DC | PRN

## 2015-04-10 MED ORDER — ADENOSINE 6 MG/2ML IV SOLN
INTRAVENOUS | Status: AC
  Filled 2015-04-10: qty 6

## 2015-04-10 MED ORDER — SODIUM CHLORIDE 0.9 % IV SOLN
INTRAVENOUS | Status: AC | PRN
  Administered 2015-04-10 (×2): 1000 mL via INTRAVENOUS

## 2015-04-10 MED ORDER — OXYCODONE HCL 5 MG PO TABS: 5.0000 mg | ORAL_TABLET | ORAL | Status: DC | PRN

## 2015-04-10 MED ORDER — DEXTROSE 5 % IV SOLN
5.0000 mg/h | INTRAVENOUS | Status: DC
  Administered 2015-04-10: 5 mg/h via INTRAVENOUS
  Administered 2015-04-11: 7.5 mg/h via INTRAVENOUS
  Filled 2015-04-10 (×2): qty 100

## 2015-04-10 MED ORDER — METOPROLOL TARTRATE 1 MG/ML IV SOLN
5.0000 mg | Freq: Once | INTRAVENOUS | Status: AC
  Administered 2015-04-10: 5 mg via INTRAVENOUS

## 2015-04-10 MED ORDER — CARVEDILOL 6.25 MG PO TABS
6.2500 mg | ORAL_TABLET | Freq: Two times a day (BID) | ORAL | Status: DC
  Administered 2015-04-10 – 2015-04-11 (×2): 6.25 mg via ORAL
  Filled 2015-04-10 (×2): qty 1

## 2015-04-10 NOTE — Plan of Care (Signed)
Problem: Pain Managment: Goal: General experience of comfort will improve Outcome: Completed/Met Date Met:  04/10/15 Pt educated on pain scale and interventions. Pt verbalized understanding.

## 2015-04-10 NOTE — Progress Notes (Signed)
Notified Vin Bhaghat, PA of NSR. New orders received. Will continue to monitor.  Reginold Agent, RN

## 2015-04-10 NOTE — ED Provider Notes (Signed)
CSN: [id number]     Arrival date & time 04/10/15  1135 History   First MD Initiated Contact with Patient 04/10/15 1146     Chief Complaint  Patient presents with  . Palpitations    HPI   Willie Daniel is a 45 y.o. male with a PMH of HTN, L-TGA, VSD who presents to the ED with palpitations, which he states woke him from sleep around 4 AM this morning. He reports his symptoms have been constant. He denies exacerbating factors. He has not tried anything for symptom relief. He reports one episode of diaphoresis. He denies fever, chills, chest pain, shortness of breath, abdominal pain, N/V, lightheadedness, dizziness, numbness, weakness, paresthesia. He denies excessive caffeine use. He states he drinks alcohol 4-5 times per week and typically consumes a 6 pack or 12 pack. He notes he last consumed alcohol yesterday evening.   Past Medical History  Diagnosis Date  . Hypertension    Past Surgical History  Procedure Laterality Date  . Cardiac surgery  1976    age 77 at [place]  . Vasectomy  2010    general anesth  . I&d extremity  2004    right leg infection  . Knee arthroscopy with medial menisectomy Left 12/14/2012    Procedure: KNEE ARTHROSCOPY WITH PARTIAL MEDIAL AND LATERAL MENISECTOMY EXTENSIVE SYNOVECTOMY REMOVAL NODULE LEFT KNEE;  Surgeon: Ninetta Lights, MD;  Location: [place];  Service: Orthopedics;  Laterality: Left;   Family History  Problem Relation Age of Onset  . Multiple myeloma Mother    Social History  Substance Use Topics  . Smoking status: Never Smoker   . Smokeless tobacco: None  . Alcohol Use: 0.0 oz/week    0 Standard drinks or equivalent per week     Comment: quit 12 pack per day, 4-5 times a week     Review of Systems  Constitutional: Positive for diaphoresis and fatigue. Negative for fever and chills.  Respiratory: Negative for shortness of breath.   Cardiovascular: Positive for palpitations. Negative for chest pain.  Gastrointestinal:  Negative for nausea, vomiting and abdominal pain.  Neurological: Negative for dizziness, syncope, weakness, light-headedness and numbness.  All other systems reviewed and are negative.     Allergies  Review of patient's allergies indicates no known allergies.  Home Medications   Prior to Admission medications   Medication Sig Start Date End Date Taking? Authorizing Provider  aspirin EC 81 MG tablet Take 81 mg by mouth every 6 (six) hours as needed for mild pain or moderate pain.   Yes Historical Provider, MD  benazepril (LOTENSIN) 20 MG tablet Take 20 mg by mouth at bedtime.    Yes Historical Provider, MD  carvedilol (COREG) 6.25 MG tablet Take 6.25 mg by mouth 2 (two) times daily with a meal.   Yes Historical Provider, MD  HYDROcodone-acetaminophen (NORCO/VICODIN) 5-325 MG tablet Take 1 tablet by mouth every 6 (six) hours as needed for moderate pain or severe pain.  01/30/15  Yes Historical Provider, MD  indomethacin (INDOCIN) 50 MG capsule Take 50 mg by mouth daily as needed for mild pain or moderate pain.  03/20/15  Yes Historical Provider, MD  naproxen sodium (ANAPROX) 220 MG tablet Take 220 mg by mouth 2 (two) times daily as needed (pain).   Yes Historical Provider, MD  oxyCODONE-acetaminophen (PERCOCET) 10-325 MG per tablet Take 1-2 tablets by mouth every 4 (four) hours as needed for pain. Patient not taking: Reported on 04/10/2015 12/14/12   Leory Plowman  Hicks, PA-C    BP 114/89 mmHg  Pulse 103  Temp(Src) 98.5 F (36.9 C) (Oral)  Resp 20  Ht '6\' 2"'  (1.88 m)  Wt 127.007 kg  BMI 35.93 kg/m2  SpO2 98% Physical Exam  Constitutional: He is oriented to person, place, and time. He appears well-developed and well-nourished. No distress.  HENT:  Head: Normocephalic and atraumatic.  Right Ear: External ear normal.  Left Ear: External ear normal.  Nose: Nose normal.  Mouth/Throat: Uvula is midline, oropharynx is clear and moist and mucous membranes are normal.  Eyes: Conjunctivae, EOM and  lids are normal. Pupils are equal, round, and reactive to light. Right eye exhibits no discharge. Left eye exhibits no discharge. No scleral icterus.  Neck: Normal range of motion. Neck supple.  Cardiovascular: Regular rhythm, normal heart sounds, intact distal pulses and normal pulses.  Tachycardia present.   Pulmonary/Chest: Effort normal and breath sounds normal. No respiratory distress. He has no wheezes. He has no rales.  Abdominal: Soft. Normal appearance and bowel sounds are normal. He exhibits no distension and no mass. There is no tenderness. There is no rigidity, no rebound and no guarding.  Musculoskeletal: Normal range of motion. He exhibits no edema or tenderness.  Neurological: He is alert and oriented to person, place, and time.  Skin: Skin is warm, dry and intact. No rash noted. He is not diaphoretic. No erythema. No pallor.  Psychiatric: He has a normal mood and affect. His speech is normal and behavior is normal.  Nursing note and vitals reviewed.   ED Course  Procedures (including critical care time)  Labs Review Labs Reviewed  BASIC METABOLIC PANEL - Abnormal; Notable for the following:    Glucose, Bld 148 (*)    Creatinine, Ser 1.38 (*)    GFR calc non Af Amer 60 (*)    All other components within normal limits  CBC WITH DIFFERENTIAL/PLATELET  PROTIME-INR  Randolm Idol, ED    Imaging Review Dg Chest Portable 1 View  04/10/2015  CLINICAL DATA:  45 year old male with tachycardia and chest pain since this morning. Initial encounter. EXAM: PORTABLE CHEST 1 VIEW COMPARISON:  02/05/2015 FINDINGS: Portable AP upright view at 1302 hours. Sequelae of median sternotomy. Persistent epicardial type pacemaker wire again projects over the left cardiophrenic angle. As before, the majority of the cardiac silhouette is to the right of midline, but the aortic arch and gastric position appear to be orthotopic. Stable cardiac size and mediastinal contours. Visualized tracheal air  column is within normal limits. No pneumothorax, pulmonary edema, pleural effusion or confluent pulmonary opacity. IMPRESSION: 1.  No acute cardiopulmonary abnormality. 2. Previous heart surgery, perhaps related to congenital heart disease in light of the atypical cardiac silhouette. Retained epicardial pacer wire and sequelae of sternotomy. Electronically Signed   By: Genevie [name] M.D.   On: 04/10/2015 13:41     I have personally reviewed and evaluated these images and lab results as part of my medical decision-making.   EKG Interpretation   Date/Time:  Thursday April 10 2015 12:18:34 EST Ventricular Rate:  146 PR Interval:  186 QRS Duration: 87 QT Interval:  307 QTC Calculation: 478 R Axis:   45 Text Interpretation:  Sinus or ectopic atrial tachycardia Ventricular  tachycardia, unsustained Repol abnrm, prob ischemia, inferolateral lds  Since previous tracing svt is no longer present Confirmed by Canary Brim  MD,  [place] [id number]) on 04/10/2015 12:25:18 PM      CRITICAL CARE Performed by: Marella Chimes  Total critical care time: 45 minutes  Critical care time was exclusive of separately billable procedures and treating other patients.  Critical care was necessary to treat or prevent imminent or life-threatening deterioration.  Critical care was time spent personally by me on the following activities: development of treatment plan with patient and/or surrogate as well as nursing, discussions with consultants, evaluation of patient's response to treatment, examination of patient, obtaining history from patient or surrogate, ordering and performing treatments and interventions, ordering and review of laboratory studies, ordering and review of radiographic studies, pulse oximetry and re-evaluation of patient's condition.   MDM   Final diagnoses:  SVT (supraventricular tachycardia) ([place])    45 year old male with history of congenital heart disease presents with palpitations. Reports  fatigue and diaphoresis with onset of symptoms. Denies chest pain, shortness of breath.   Patient is afebrile. HR initially 200s, EKG reveals SVT.  Given 6 mg adenosine, 12 mg adenosine, 12 mg adenosine with transient decrease in HR (lowest mid 90s on 3rd attempt).  Attempted vagal maneuvers, which were not effective. Cardiology consulted.   Patient seen by Dr. Irish Lack, who attempted carotid massage with subsequent decrease in HR to 120s-130s. Patient given 1 L NS and 5 mg metoprolol per cardiology with decrease in HR to low 073X, BP 106Y systolic. Per Dr. Lovena Le, rhythm 2:1 atrial flutter.   CBC negative for leukocytosis or anemia. BMP remarkable for creatinine 1.38. Troponin 0.06. INR 1.11.  CXR no acute cardiopulmonary abnormality, previous heart surgery.  Patient to be admitted to cardiology for further evaluation and management.  Patient discussed with and seen by Dr. Canary Brim.   BP 114/89 mmHg  Pulse 103  Temp(Src) 98.5 F (36.9 C) (Oral)  Resp 20  Ht '6\' 2"'  (1.88 m)  Wt 127.007 kg  BMI 35.93 kg/m2  SpO2 98%     Marella Chimes, PA-C 04/10/15 [place], MD 04/10/15 [phone]

## 2015-04-10 NOTE — Progress Notes (Signed)
Patient ID: Willie Daniel, male   DOB: April 16, 1970, 45 y.o.   MRN: [id number]  Reason for Consult:1:1 atrial flutter  Referring Physician: Dr. Walker Kehr is an 45 y.o. male.   HPI: The patient is a 45 yo man who presented with sob and SVT at 200/min. He was given IV adenosine and this demonstrated what appears to be typical atrial flutter with 1:1 conduction, blocking down to 2:1 and clearly demonstrating atrial flutter. The patient has been admitted and started on systemic anti-coagulation. His AV conduction has improved. He notes that he felt well before he went to bed last night. He notes that his sob has been chronically reduced for several months.Note his congenital heart history as documented by Dr. Christ Kick.  PMH: Past Medical History  Diagnosis Date  . Hypertension     PSHX: Past Surgical History  Procedure Laterality Date  . Cardiac surgery  1976    age 33 at [place]  . Vasectomy  2010    general anesth  . I&d extremity  2004    right leg infection  . Knee arthroscopy with medial menisectomy Left 12/14/2012    Procedure: KNEE ARTHROSCOPY WITH PARTIAL MEDIAL AND LATERAL MENISECTOMY EXTENSIVE SYNOVECTOMY REMOVAL NODULE LEFT KNEE;  Surgeon: Ninetta Lights, MD;  Location: [place];  Service: Orthopedics;  Laterality: Left;    FAMHX: Family History  Problem Relation Age of Onset  . Multiple myeloma Mother     Social History:  reports that he has never smoked. He does not have any smokeless tobacco history on file. He reports that he drinks alcohol. He reports that he does not use illicit drugs.  Allergies: No Known Allergies  Medications: I have reviewed the patient's current medications.  Dg Chest Portable 1 View  04/10/2015  CLINICAL DATA:  45 year old male with tachycardia and chest pain since this morning. Initial encounter. EXAM: PORTABLE CHEST 1 VIEW COMPARISON:  02/05/2015 FINDINGS: Portable AP upright view at 1302 hours. Sequelae of  median sternotomy. Persistent epicardial type pacemaker wire again projects over the left cardiophrenic angle. As before, the majority of the cardiac silhouette is to the right of midline, but the aortic arch and gastric position appear to be orthotopic. Stable cardiac size and mediastinal contours. Visualized tracheal air column is within normal limits. No pneumothorax, pulmonary edema, pleural effusion or confluent pulmonary opacity. IMPRESSION: 1.  No acute cardiopulmonary abnormality. 2. Previous heart surgery, perhaps related to congenital heart disease in light of the atypical cardiac silhouette. Retained epicardial pacer wire and sequelae of sternotomy. Electronically Signed   By: Genevie [name] M.D.   On: 04/10/2015 13:41    ROS  As stated in the HPI and negative for all other systems.  Physical Exam  Vitals:Blood pressure 110/74, pulse 108, temperature 98.6 F (37 C), temperature source Oral, resp. rate 25, height _0  (1.88 m), weight 295 lb 8 oz (134.038 kg), SpO2 95 %.  Well appearing middle aged man, NAD HEENT: Unremarkable Neck:  No JVD, no thyromegally Lymphatics:  No adenopathy Back:  No CVA tenderness Lungs:  Clear HEART:  IRegular tachy rate rhythm, no murmurs, no rubs, no clicks Abd:  Obese, soft, positive bowel sounds, no organomegally, no rebound, no guarding Ext:  2 plus pulses, no edema, no cyanosis, no clubbing Skin:  No rashes no nodules Neuro:  CN II through XII intact, motor grossly intact  ECG - 1:1 atrial flutter followed by 2:1 atrial flutter  CXR - reviewed  Echo results - reviewed  Assessment/Plan: 1. Atrial flutter with 1;1 and 2:1 AV conduction - he appears to have been out of rhythm since early this morning. He went to bed feeling well. He will be admitted and started on Eliquis and given IV cardizem and beta blocker therapy. He can be cardioverted tomorrow. If his rate is controlled, he could be discharged on AV nodal blocking drugs and Eliquis and could  followup at [facility]. I suspect he will need TEE if he is allowed to be out of rhythm more than 48 hours.  2. Transposition with VSD repair. He has failure of his systemic ventricle with his EF down from several months ago 3. Obesity - he is overweight and needs to lose weight.   Carleene Overlie TaylorMD 04/10/2015, 8:58 PM

## 2015-04-10 NOTE — H&P (Signed)
Patient ID: Willie Daniel MRN: [id number], DOB/AGE: April 13, 1970   Admit date: 04/10/2015   Primary Physician: No PCP Per Patient Primary Cardiologist: Willie Daniel at [place]. Profile:  Willie Daniel is 45 yo male with PMH of HTN, EtOH abuse, congenital heart disease with VSD s/p repair at age 50 at [facility], L-loop transposition of great arteries (S,L,L), pulmonic stenosis (congenital mild gradient 56mHg by echo 2011) and dextrocardia presented with palpitation and found to be in tachycardia on arrival to ED.  Problem List  Past Medical History  Diagnosis Date  . Hypertension     Past Surgical History  Procedure Laterality Date  . [place] [age][place] Vasectomy  2010    general anesth  . I&d extremity  2004    right leg infection  . Knee arthroscopy with medial menisectomy Left 12/14/2012    Procedure: KNEE ARTHROSCOPY WITH PARTIAL MEDIAL AND LATERAL MENISECTOMY EXTENSIVE SYNOVECTOMY REMOVAL NODULE LEFT KNEE;  Surgeon: DNinetta Lights MD;  [place]  Service: Orthopedics;  Laterality: Left;     Allergies  No Known Allergies  HPI  Mr. FDissingeris 45yo male with PMH of HTN, [facility] [age][facility]-loop transposition of great arteries (S,L,L), pulmonic stenosis (congenital mild gradient 163mg by echo 2011) and dextrocardia. He has [facility]  Last echo obtained on 10/16/2014 showed severely depressed systemic ventricular function/morphologically RV approximately 30%, normal size and function of pulmonary ventricle (morphologic LV), moderately enlarged biatrial chamber, mild pulmonary AV valvular regurg.   He had a cardiopulmonary stress test on 12/03/2014 which showed normal functional capability with peak oxygen consumption of 22.7 mL/kg/min, status reflects maximal exertion test as suggested patient has functional capacity similar to normal differential value  for age and gender and body size, pulmonary limitation to exercise capacity suggested by ventilatory reserve exhausted at peak exercise, mild circulatory limitation to exercise suggested but blunted blood pressure augmentation with exercise, post exercise FEV1 measurement without significant decline compared to rest to baseline to suggest exercise-induced bronchoconstriction.   Cardiac MRI obtained on 12/03/2014 showed calculated RV ejection fraction 33%, morphologic right ventricle is anterior and leftward supplies systemic circulation and is severely dilated with moderate hypertrophy and moderately impaired systolic function, interventricular septal wall motion is hypokinetic and a flattened throughout the cardiac cycle consistent with increased right ventricular pressure and volume load, pulmonary valve is abnormally and is mildly compressed anteriorly by aorta, mild pulmonic valve stenosis and mild regurgitation. The left and right coronary arteries arise from the aortic sinuses of Valsalva facing the posterior pulmonary valve (typical anatomy for transposition), rightward coronary artery courses in the anterior interventricular groove consistent with morphologic left coronary artery distribution, left coronary artery courses in the left atrioventricular groove consistent with morphologic right coronary artery distribution. Differential blood flow demonstrates 72% flow to the right lung, 28% flow to the left lung. Compared to the previous image, calculated RV ejection fraction has been down from 46%  His last follow-up was Dr. BaMichaelle Birksas on 04/10/2015, at which time he planned to do MRI in one year and cardiopulmonary stress test and BNP in 1 year. Patient was recommended to continue aerobic exercise and weight loss attempt, in order to optimize his opportunity if transplant become a necessity in the future. He does have alcohol abuse problem, and drinks large quantity of  alcohol 4-5 days a week (6-12 pack  each day). However despite his history of congenital heart disease, he never had any kind of arrhythmia in the past. He woke up around 4 AM this morning having chest palpitation. He presented to [facility], telemetry shows he was in wide complex tachycardia. Cardiology was consulted, while in the ED, he was given 6 mg adenosine and followed by 12 mg adenosine followed by another 12 mg adenosine without his heart rate slowing down. His heart rate eventually slowed down with 5 mg Lopressor. Willie Daniel has reviewed his telemetry, felt the patient is likely in 2-1 atrial flutter.    Home Medications  Prior to Admission medications   Medication Sig Start Date End Date Taking? Authorizing Provider  benazepril (LOTENSIN) 20 MG tablet Take 20 mg by mouth daily.    Historical Provider, MD  carvedilol (COREG) 6.25 MG tablet Take 6.25 mg by mouth 2 (two) times daily with a meal.    Historical Provider, MD  oxyCODONE-acetaminophen (PERCOCET) 10-325 MG per tablet Take 1-2 tablets by mouth every 4 (four) hours as needed for pain. 12/14/12   Willie Males, PA-C    Family History  Family History  Problem Relation Age of Onset  . Multiple myeloma Mother     Social History  Social History   Social History  . Marital Status: Married    Spouse Name: N/A  . Number of Children: N/A  . Years of Education: N/A   Occupational History  . Not on file.   Social History Main Topics  . Smoking status: Never Smoker   . Smokeless tobacco: Not on file  . Alcohol Use: 0.0 oz/week    0 Standard drinks or equivalent per week     Comment: quit 12 pack per day, 4-5 times a week  . Drug Use: No  . Sexual Activity: Not on file   Other Topics Concern  . Not on file   Social History Narrative     Review of Systems General:  No chills, fever, night sweats or weight changes.  Cardiovascular:  No chest pain, dyspnea on exertion, edema, orthopnea, paroxysmal nocturnal dyspnea.  +palpitations Dermatological: No rash, lesions/masses Respiratory: No cough, dyspnea Urologic: No hematuria, dysuria Abdominal:   No vomiting, diarrhea, bright red blood per rectum, melena, or hematemesis +nausea Neurologic:  No visual changes, wkns, changes in mental status. All other systems reviewed and are otherwise negative except as noted above.  Physical Exam  Blood pressure 116/87, pulse 117, temperature 98.5 F (36.9 C), temperature source Oral, resp. rate 26, height '6\' 2"'  (1.88 m), weight 280 lb (127.007 kg), SpO2 97 %.  General: Pleasant, NAD Psych: Normal affect. Neuro: Alert and oriented X 3. Moves all extremities spontaneously. HEENT: Normal  Neck: Supple without bruits or JVD. Lungs:  Resp regular and unlabored, CTA. Heart: tachycardic no s3, s4, or murmurs. Abdomen: Soft, non-tender, non-distended, BS + x 4.  Extremities: No clubbing, cyanosis or edema. DP/PT/Radials 2+ and equal bilaterally.  Labs  Troponin (Point of Care Test) No results for input(s): TROPIPOC in the last 72 hours. No results for input(s): CKTOTAL, CKMB, TROPONINI in the last 72 hours. Lab Results  Component Value Date   HGB 14.6 12/14/2012   HCT 43.0 12/14/2012   No results for input(s): NA, K, CL, CO2, BUN, CREATININE, CALCIUM, PROT, BILITOT, ALKPHOS, ALT, AST, GLUCOSE in the last 168 hours.  Invalid input(s): LABALBU No results found for: CHOL, HDL, LDLCALC, TRIG No results found for:  DDIMER   Radiology/Studies  No results found.  ECG  Possible 2:1 atrial flutter  Echocardiogram 10/16/2014 done at [facility]  1. Dilated, severly depressed systemic ventricular function. EF approximately 30%. (morphologic RV) 2. Normal size and function of pulmonary ventricle. (morphologic LV) 3. Both atria are moderately enlarged. 4. Trivial systemic AV valvular regurgitation. 5. Mild pulmonary AV valvular regugitation; 3.1 m/sec.. Estimated pulmonary ventricular systolic pressure= [id number]. 6. Dilated  IVC that collapses. Dilated hepatic veins with flow reversal. 7. No aortic regurgitation. 8. Mild pulmonary valve regurgitation. 9. The pulmonary valve is mildly thickened and appears to open, but continuous wave Doppler through the PV into the PA is 3.50msecond. Peak gradient= 388mg, mean gradient= 2273m. Main PA smaller calibre vessel compaired with aorta. 10. From the SSN, there is turbulence noted by color Doppler in the PA.      ASSESSMENT AND PLAN  1. Wide complex tachycardia likely 2:1 atrial flutter  - likely related combination of congenital heart disease and his significant amount of EtOH abuse (drink 4-5 days a week with 6-12 pack each time)  - will ask EP to see, discuss with Dr. VarIrish Lackiven his EF 30s on last cardiac MRI, IV diltiazem not ideal choice, alternative is increase coreg or change to Toprol XL. Will hold ACE and increase BB for now, but he will eventually need ACEI added back on HR is better  - will discuss with patient systemic anticoagulation therapy  2. [facility]en[facility] 45 [facility] great [facility] (S,L,L), pulmonic stenosis (congenital mild gradient 24m58mby echo 2011) and dextrocardia  3. Chronic systolic heart failure: euvolemic on exam  - RV connected to systemic circulation, RV EF on last cardiac MRI 11/2014 30%  4. HTN  5. EtOH abuse: CIWA protocol, monitor for withdraw  SignHilbert Corrigan-C 04/10/2015, 12:31 PM   I have examined the patient and reviewed assessment and plan and discussed with patient.  Agree with above as stated.  I saw the patient emergently in the emergency room due to supraventricular tachycardia with a heart rate around 200 which would not abate with IV adenosine. His symptoms started at 4:00 this morning. He has never had this kind of symptom before that he can remember. When I saw the patient, his blood pressure was 91/55. He was not lightheaded but his heart rate was  around 200. He had received adenosine 6 mg 1 and then 12 mg 2. I was told that there was a brief slowing of his heart rate but then it came back immediately. On exam, he appeared comfortable. He was tachycardic. No wheezing. No lower extremity edema. I immediately performed carotid massage and his heart rate slowed down into the 140s. There appeared to be flutter waves but they were not prominent. We then gave the patient 5 mg of IV Lopressor. His heart rate then came down and stayed at around 102. It appeared he was in 2-1 atrial flutter.  Upon further interviewing of the patient, he has had significant congenital heart disease. He had transposition of the great vessels. He had surgery at age 30. [facility]ac [facility] revealed that his right ventricle supplies his systemic circulation. The right ventricular ejection fraction had dropped to 33%. His left ventricle supplies his pulmonary circulation and had a normal ejection fraction.  I spoke to Dr. TaylLovena Daniel will place the patient on IV Cardizem. Will start anticoagulation for now with low molecular weight heparin.  In the event he requires a cardioversion, I did not want him to have to wait for multiple doses of an oral anticoagulant before cardioversion. He does drink alcohol fairly heavily. He states he can drink at least a 6-12 pack daily. This may have contributed to his arrhythmia.  Long-term, it will be important for him to cut back on his alcohol use, especially if his going to require long-term anticoagulation.  For now, will continue with IV Cardizem. Electrophysiology will see the patient and give further recommendations.  Continue carvedilol. May have to hold his ACE inhibitor at this point due to the Cardizem being started.  We'll also try to forward records to [facility] to keep his regular cardiologist in the loop.  Critical care time 40 minutes   Tallis Soledad S.

## 2015-04-10 NOTE — ED Notes (Signed)
Cardiology at bedside.

## 2015-04-10 NOTE — Progress Notes (Signed)
ANTICOAGULATION CONSULT NOTE - Initial Consult  Pharmacy Consult for lovenox Indication: atrial fibrillation  No Known Allergies  Patient Measurements: Height:  (188 cm) Weight: 280 lb (127.007 kg) IBW/kg (Calculated) : 82.2  Vital Signs: Temp: 98.5 F (36.9 C) (02/02 1141) Temp Source: Oral (02/02 1141) BP: 114/89 mmHg (02/02 1300) Pulse Rate: 103 (02/02 1300)  Labs:  Recent Labs  04/10/15 1254 04/10/15 1258  HGB  --  14.9  HCT  --  45.8  PLT  --  267  LABPROT 14.5  --   INR 1.11  --   CREATININE  --  1.38*    Estimated Creatinine Clearance: 93.7 mL/min (by C-G formula based on Cr of 1.38).   Medical History: Past Medical History  Diagnosis Date  . Hypertension    Assessment: 35 yom presented to the ED with palpitations and tachycardia secondary to afib. To begin lovenox for systemic anticoagulation. CBC is WNL and Scr is 1.38. He is not on any anticoagulation PTA.   Goal of Therapy:  Anti-Xa level 0.6-1 units/ml 4hrs after LMWH dose given Monitor platelets by anticoagulation protocol: Yes   Plan:  - Lovenox  SQ Q12H - F/u renal fxn, S&S of bleeding - F/u plans for oral anticoagulation  Nakiyah Beverley, Drake Leach 04/10/2015,1:36 PM

## 2015-04-10 NOTE — ED Notes (Signed)
Pt presents with onset of palpitations that woke him from sleep this morning.  Pt reports nausea and diaphoresis.

## 2015-04-11 DIAGNOSIS — I10 Essential (primary) hypertension: Secondary | ICD-10-CM | POA: Diagnosis not present

## 2015-04-11 DIAGNOSIS — Q205 Discordant atrioventricular connection: Secondary | ICD-10-CM | POA: Diagnosis not present

## 2015-04-11 DIAGNOSIS — Z7982 Long term (current) use of aspirin: Secondary | ICD-10-CM | POA: Diagnosis not present

## 2015-04-11 DIAGNOSIS — I484 Atypical atrial flutter: Secondary | ICD-10-CM | POA: Diagnosis not present

## 2015-04-11 DIAGNOSIS — R5383 Other fatigue: Secondary | ICD-10-CM | POA: Diagnosis not present

## 2015-04-11 DIAGNOSIS — I471 Supraventricular tachycardia: Secondary | ICD-10-CM | POA: Diagnosis not present

## 2015-04-11 LAB — BASIC METABOLIC PANEL
Anion gap: 8 (ref 5–15)
BUN: 10 mg/dL (ref 6–20)
CO2: 28 mmol/L (ref 22–32)
CREATININE: 1.22 mg/dL (ref 0.61–1.24)
Calcium: 9.1 mg/dL (ref 8.9–10.3)
Chloride: 107 mmol/L (ref 101–111)
GFR calc Af Amer: >60 mL/min (ref >60)
Glucose, Bld: 113 mg/dL — ABNORMAL HIGH (ref 65–99)
Potassium: 4.6 mmol/L (ref 3.5–5.1)
SODIUM: 143 mmol/L (ref 135–145)

## 2015-04-11 LAB — TROPONIN I
Troponin I: 0.15 ng/mL — ABNORMAL HIGH (ref <0.031)
Troponin I: 0.18 ng/mL — ABNORMAL HIGH (ref <0.031)

## 2015-04-11 LAB — LIPID PANEL
CHOLESTEROL: 137 mg/dL (ref 0–200)
HDL: 26 mg/dL — ABNORMAL LOW (ref >40)
LDL Cholesterol: 72 mg/dL (ref 0–99)
Total CHOL/HDL Ratio: 5.3 RATIO
Triglycerides: 194 mg/dL — ABNORMAL HIGH (ref <150)
VLDL: 39 mg/dL (ref 0–40)

## 2015-04-11 MED ORDER — APIXABAN 5 MG PO TABS: 5.0000 mg | ORAL_TABLET | Freq: Two times a day (BID) | ORAL | Status: DC

## 2015-04-11 MED ORDER — BENAZEPRIL HCL 10 MG PO TABS: 10.0000 mg | ORAL_TABLET | Freq: Every day | ORAL | Status: DC

## 2015-04-11 MED ORDER — BENAZEPRIL HCL 20 MG PO TABS: 10.0000 mg | ORAL_TABLET | Freq: Every day | ORAL | Status: DC

## 2015-04-11 MED ORDER — DILTIAZEM HCL ER COATED BEADS 120 MG PO TB24: 120.0000 mg | ORAL_TABLET | Freq: Two times a day (BID) | ORAL | Status: DC

## 2015-04-11 NOTE — Care Management (Signed)
Eliquis card provided to pt- 30 day free card and $10.00 co pay card. No Benefits check completed due to late notification of the medication. Pt is aware that pharmacy will provide the cost. No further needs from CM at this time. Gala Lewandowsky, RN,BSN [phone]

## 2015-04-11 NOTE — Progress Notes (Addendum)
SUBJECTIVE: The patient is doing well today.  At this time, he denies chest pain, shortness of breath, or any new concerns.  Reports his palpitations as resolved.  Never had co of any kind of CP.  He still is in atrial flutter  . carvedilol  6.25 mg Oral BID WC  . enoxaparin (LOVENOX) injection  130 mg Subcutaneous Q12H   . diltiazem (CARDIZEM) infusion 7.5 mg/hr (04/11/15 0651)    OBJECTIVE: Physical Exam: Filed Vitals:   04/11/15 0105 04/11/15 0340 04/11/15 0525 04/11/15 0816  BP:   114/87 125/88  Pulse:   93 99  Temp:  98.4 F (36.9 C)  98.1 F (36.7 C)  TempSrc:  Oral  Oral  Resp:   23 23  Height:      Weight:  296 lb 14.4 oz (134.673 kg)    SpO2: 94%  97% 94%    Intake/Output Summary (Last 24 hours) at 04/11/15 1610 Last data filed at 04/11/15 0700  Gross per 24 hour  Intake 340.79 ml  Output   1075 ml  Net -734.21 ml    Telemetry reveals appears likely still in AFlutter, rates 100-105  GEN- The patient is morbidly obese, well appearing, alert and oriented x 3 today.   Head- normocephalic, atraumatic Eyes-  Sclera clear, conjunctiva pink Ears- hearing intact Oropharynx- clear Neck- supple, no JVP Lungs- Clear to ausculation bilaterally, normal work of breathing Heart- irregular rate and rhythm, tachycardic, no significant murmurs, no rubs or gallops GI- soft, NT, ND Extremities- no clubbing, cyanosis, or edema Skin- no rash or lesion Psych- euthymic mood, full affect Neuro- no gross deficits appreciated  LABS: Basic Metabolic Panel:  Recent Labs  96/04/54 1258 04/11/15 0600  NA 139 143  K 5.1 4.6  CL 105 107  CO2 23 28  GLUCOSE 148* 113*  BUN 13 10  CREATININE 1.38* 1.22  CALCIUM 9.2 9.1   CBC:  Recent Labs  04/10/15 1258  WBC 7.8  NEUTROABS 5.4  HGB 14.9  HCT 45.8  MCV 95.4  PLT 267   Cardiac Enzymes:  Recent Labs  04/10/15 1911 04/10/15 2330 04/11/15 0600  TROPONINI 0.19* 0.18* 0.15*   BNP: Invalid input(s):  POCBNP D-Dimer: No results for input(s): DDIMER in the last 72 hours. Hemoglobin A1C: No results for input(s): HGBA1C in the last 72 hours. Fasting Lipid Panel:  Recent Labs  04/11/15 0600  CHOL 137  HDL 26*  LDLCALC 72  TRIG 098*  CHOLHDL 5.3   Thyroid Function Tests:  Recent Labs  04/10/15 1613  TSH 2.471   Echocardiogram 10/16/2014 done at [facility]  1. Dilated, severly depressed systemic ventricular function. EF approximately 30%. (morphologic RV) 2. Normal size and function of pulmonary ventricle. (morphologic LV) 3. Both atria are moderately enlarged. 4. Trivial systemic AV valvular regurgitation. 5. Mild pulmonary AV valvular regugitation; 3.1 m/sec.. Estimated pulmonary ventricular systolic pressure= . 6. Dilated IVC that collapses. Dilated hepatic veins with flow reversal. 7. No aortic regurgitation. 8. Mild pulmonary valve regurgitation. 9. The pulmonary valve is mildly thickened and appears to open, but continuous wave Doppler through the PV into the PA is 3.46m/second. Peak gradient= , mean gradient= . Main PA smaller calibre vessel compaired with aorta. 10. From the SSN, there is turbulence noted by color Doppler in the PA.         RADIOLOGY: Dg Chest Portable 1 View 04/10/2015  CLINICAL DATA:  45 year old male with tachycardia and chest pain since this morning. Initial encounter. EXAM:  PORTABLE CHEST 1 VIEW COMPARISON:  02/05/2015 FINDINGS: Portable AP upright view at 1302 hours. Sequelae of median sternotomy. Persistent epicardial type pacemaker wire again projects over the left cardiophrenic angle. As before, the majority of the cardiac silhouette is to the right of midline, but the aortic arch and gastric position appear to be orthotopic. Stable cardiac size and mediastinal contours. Visualized tracheal air column is within normal limits. No pneumothorax, pulmonary edema, pleural effusion or confluent pulmonary opacity. IMPRESSION: 1.  No  acute cardiopulmonary abnormality. 2. Previous heart surgery, perhaps related to congenital heart disease in light of the atypical cardiac silhouette. Retained epicardial pacer wire and sequelae of sternotomy. Electronically Signed   By: Odessa Fleming M.D.   On: 04/10/2015 13:41    ASSESSMENT AND PLAN:  Active Problems:   1. Atrial flutter (HCC)     On diltiazem gtt, coreg with improved rate control     On lovenox BID dosing  2. Congential heart disease      Follows at [facility], PA-C 04/11/2015 9:05 AM  Need input from [facility]   Have called DrBashore and wait for call back Questions 1) anticoagulatnt  Of choice 1a) rate control of choice  2) would favor TEE and this may need to be done at [facility] to make sure baffles etc are clear 3) also has sleep apnea and this willneed attention , potentially even nocturnal O2 if sleep study delayed  The impaired systemic ventricle is concern re SCD risk but will defer ICD decision to [facility]    I have spoken with Dr Earma Reading,  D transposition with dextrocardia Hence no baffeling,    Dextro cardia >>> PATCH PLACEMENT   Rec TEE DCCV  Transition to apixoban post DCCV Continue dilt for rate control [facility] will arrange outpt sleep study followup next 1-2 weeks w Dr Earma Reading

## 2015-04-11 NOTE — Discharge Instructions (Signed)
You are scheduled for TEE (transesophageal echocardiogram) with possible cardioversion at [facility] on Monday 04/14/15.  Please arrive at 9:00AM to the admission desk. Do not eat or drink anything past midnight the evening prior except for your medicines with sips of water. Do not miss any doses of your medicines, including the Eliquis.  You will need to call Dr. Earma Reading to arrange follow up with him please, within the next 1-2 weeks.

## 2015-04-11 NOTE — Progress Notes (Signed)
Have spoken with the Pt  He would prefer to go home Rather than wait until monday as there is no availability for TEE We will discharge him on apixoban and dilt 120 bid Return on Monday for TEE DCCV He will followup with Dr Bashore at [facility]  

## 2015-04-11 NOTE — Discharge Summary (Signed)
ELECTROPHYSIOLOGY PROCEDURE DISCHARGE SUMMARY    Patient ID: Willie Daniel,  MRN: [id number], DOB/AGE: Sep 21, 1970 45 y.o.  Admit date: 04/10/2015 Discharge date: 04/11/2015  Primary Care Physician: No PCP Per Patient Primary Cardiologist: Dr. Earma Reading   Primary Discharge Diagnosis:  1. New onset Atrial flutter     CHADS2Vasc is 2, starting Eliquis  Secondary Discharge Diagnosis:  1. Congenital heart disease     D transposition with dextrocardia congenital heart disease with VSD s/p repair at age 75 at [facility], L-loop transposition of great arteries (S,L,L), pulmonic stenosis  2. HTN  No Known Allergies   Brief HPI: Willie Daniel is a 45 y.o. male saught medical attention at [facility] with palpitations, he was noted to be in a WCT with adenosine noted to be AFlutter. He was admitted for further care and observation.  Hospital Course:  The patient was reported feeling palpitations, no reported CP, SOB, near syncope or syncope.  Onset of the symptoms is uncertain, he woke to use the BR and noted them at that time, but felt well when he went to bed.Marland Kitchen  His HR was 200bpm,  he was given adenosine  then  and noted slowing that demonstrated Aflutter. He also received IV lopressor in the ER, was  admitted and placed on a cardizem gtt and stared on lovenox for anticoagulation with plans to TEE cardiovert in the morning, though unable to given the schedule being filled.   He had minimal elevation in his Troponins felt secondary to demand ischemia secondary to his tachycardia.  His Creat was elevated on admit, normalized this morning to 1.22.  There was mention of significant ETOH consumption but the patient states this inaccurate, drinking perhaps 6 beers in a week.  Dr. Graciela Husbands discussed the case with the patient's cardiologist at [facility], Dr. Earma Reading for POC.  Dr. Graciela Husbands discussed with the patient staying in the hospital for TEE/DCCV on Monday  or going home on PO cardizem and Eliquis to return as  out patient on Monday.  His HR is better controlled, 90's- 110 this morning.  BP is stable, the patient feels well.  The patient preferred to go home.  TEE has been scheduled for Monday 10:00AM, followed by DCCV if able, he is given instruction regarding his procedure, arrival time, NPO after MN, and medication instructions.   He is counseled on the importance of his medicines. And particularly the Eliquis.  He states understanding.  We are decreasing his ACE to  daily given the add on cardizem.  He is also instructed to call Dr. Paul Half office to arrange follow up with him in the next 1-2 weeks.   Physical Exam: Filed Vitals:   04/11/15 0816 04/11/15 1004 04/11/15 1046 04/11/15 1234  BP: 125/88 125/88 118/91 116/84  Pulse: 99 100 112 112  Temp: 98.1 F (36.7 C)   98.1 F (36.7 C)  TempSrc: Oral   Oral  Resp: Height:      Weight:      SpO2: 94% 99% 94% 99%     Labs:   Lab Results  Component Value Date   WBC 7.8 04/10/2015   HGB 14.9 04/10/2015   HCT 45.8 04/10/2015   MCV 95.4 04/10/2015   PLT 267 04/10/2015     Recent Labs Lab 04/11/15 0600  NA 143  K 4.6  CL 107  CO2 28  BUN 10  CREATININE 1.22  CALCIUM 9.1  GLUCOSE 113*    Discharge  Medications:    Medication List    STOP taking these medications        aspirin EC 81 MG tablet      TAKE these medications        apixaban 5 MG Tabs tablet  Commonly known as:  ELIQUIS  Take 1 tablet (5 mg total) by mouth 2 (two) times daily.  Notes to Patient:  First dose tonight 10:00PM     benazepril 10 MG tablet  Commonly known as:  LOTENSIN  Take 1 tablet (10 mg total) by mouth at bedtime.     carvedilol 6.25 MG tablet  Commonly known as:  COREG  Take 6.25 mg by mouth 2 (two) times daily with a meal.     diltiazem 120 MG 24 hr tablet  Commonly known as:  CARDIZEM LA  Take 1 tablet (120 mg total) by mouth 2 (two) times daily.     HYDROcodone-acetaminophen 5-325 MG tablet  Commonly known as:   NORCO/VICODIN  Take 1 tablet by mouth every 6 (six) hours as needed for moderate pain or severe pain.     indomethacin 50 MG capsule  Commonly known as:  INDOCIN  Take 50 mg by mouth daily as needed for mild pain or moderate pain.     naproxen sodium 220 MG tablet  Commonly known as:  ANAPROX  Take 220 mg by mouth 2 (two) times daily as needed (pain).     oxyCODONE-acetaminophen 10-325 MG tablet  Commonly known as:  PERCOCET  Take 1-2 tablets by mouth every 4 (four) hours as needed for pain.        Disposition: Home    Duration of Discharge Encounter: Greater than 30 minutes including physician time.  Norma Fredrickson, PA-C 04/11/2015 2:44 PM

## 2015-04-12 NOTE — Progress Notes (Signed)
Addendum to H&P:  CHA2DS2-Vasc score 2 (HTN, HF)  This patients CHA2DS2-VASc Score and unadjusted Ischemic Stroke Rate (% per year) is equal to 2.2 % stroke rate/year from a score of 2  Above score calculated as 1 point each if present [CHF, HTN, DM, Vascular=MI/PAD/Aortic Plaque, Age if 65-74, or Male] Above score calculated as 2 points each if present [Age > 75, or Stroke/TIA/TE]  SignedAzalee Course PA Pager: [phone]

## 2015-04-14 ENCOUNTER — Encounter (HOSPITAL_COMMUNITY): Payer: Self-pay | Admitting: *Deleted

## 2015-04-14 ENCOUNTER — Ambulatory Visit (HOSPITAL_COMMUNITY): Admission: AD | Admit: 2015-04-14 | Payer: Self-pay | Source: Ambulatory Visit | Admitting: Internal Medicine

## 2015-04-14 ENCOUNTER — Encounter (HOSPITAL_COMMUNITY)
Admission: AD | Disposition: A | Discharge status: Home / Self Care | Payer: Self-pay | Source: Ambulatory Visit | Attending: Cardiovascular Disease

## 2015-04-14 ENCOUNTER — Ambulatory Visit (HOSPITAL_COMMUNITY)
Admission: AD | Admit: 2015-04-14 | Discharge: 2015-04-14 | Disposition: A | Discharge status: Home / Self Care | Payer: BLUE CROSS/BLUE SHIELD | Source: Ambulatory Visit | Admitting: Cardiovascular Disease

## 2015-04-14 ENCOUNTER — Other Ambulatory Visit: Payer: Self-pay | Admitting: Nurse Practitioner

## 2015-04-14 ENCOUNTER — Ambulatory Visit (HOSPITAL_COMMUNITY)
Admission: AD | Admit: 2015-04-14 | Discharge: 2015-04-14 | Disposition: A | Discharge status: Home / Self Care | Payer: BLUE CROSS/BLUE SHIELD | Source: Ambulatory Visit | Attending: Cardiovascular Disease | Admitting: Cardiovascular Disease

## 2015-04-14 ENCOUNTER — Encounter (HOSPITAL_COMMUNITY)
Admission: AD | Disposition: A | Discharge status: Home / Self Care | Payer: BLUE CROSS/BLUE SHIELD | Source: Ambulatory Visit | Attending: Cardiovascular Disease

## 2015-04-14 DIAGNOSIS — I110 Hypertensive heart disease with heart failure: Secondary | ICD-10-CM | POA: Diagnosis not present

## 2015-04-14 DIAGNOSIS — I370 Nonrheumatic pulmonary valve stenosis: Secondary | ICD-10-CM | POA: Diagnosis not present

## 2015-04-14 DIAGNOSIS — Q256 Stenosis of pulmonary artery: Secondary | ICD-10-CM | POA: Insufficient documentation

## 2015-04-14 DIAGNOSIS — Q203 Discordant ventriculoarterial connection: Secondary | ICD-10-CM | POA: Diagnosis not present

## 2015-04-14 DIAGNOSIS — Q240 Dextrocardia: Secondary | ICD-10-CM | POA: Insufficient documentation

## 2015-04-14 DIAGNOSIS — Z8774 Personal history of (corrected) congenital malformations of heart and circulatory system: Secondary | ICD-10-CM | POA: Diagnosis not present

## 2015-04-14 DIAGNOSIS — I5022 Chronic systolic (congestive) heart failure: Secondary | ICD-10-CM | POA: Diagnosis not present

## 2015-04-14 DIAGNOSIS — I4892 Unspecified atrial flutter: Secondary | ICD-10-CM | POA: Diagnosis present

## 2015-04-14 DIAGNOSIS — I4891 Unspecified atrial fibrillation: Secondary | ICD-10-CM | POA: Insufficient documentation

## 2015-04-14 HISTORY — PX: ELECTROPHYSIOLOGIC STUDY: SHX172A

## 2015-04-14 HISTORY — PX: TEE WITHOUT CARDIOVERSION: SHX5443

## 2015-04-14 SURGERY — CARDIOVERSION (CATH LAB)

## 2015-04-14 SURGERY — ECHOCARDIOGRAM, TRANSESOPHAGEAL
Anesthesia: Moderate Sedation

## 2015-04-14 MED ORDER — FENTANYL CITRATE (PF) 100 MCG/2ML IJ SOLN
INTRAMUSCULAR | Status: DC | PRN
  Administered 2015-04-14 (×2): 25 ug via INTRAVENOUS

## 2015-04-14 MED ORDER — MIDAZOLAM HCL 5 MG/ML IJ SOLN
INTRAMUSCULAR | Status: AC
  Filled 2015-04-14: qty 2

## 2015-04-14 MED ORDER — FENTANYL CITRATE (PF) 100 MCG/2ML IJ SOLN
INTRAMUSCULAR | Status: AC
  Filled 2015-04-14: qty 2

## 2015-04-14 MED ORDER — MIDAZOLAM HCL 2 MG/2ML IJ SOLN
INTRAMUSCULAR | Status: AC
  Filled 2015-04-14: qty 2

## 2015-04-14 MED ORDER — BUTAMBEN-TETRACAINE-BENZOCAINE 2-2-14 % EX AERO
INHALATION_SPRAY | CUTANEOUS | Status: DC | PRN
  Administered 2015-04-14: 2 via TOPICAL

## 2015-04-14 MED ORDER — FENTANYL CITRATE (PF) 100 MCG/2ML IJ SOLN
INTRAMUSCULAR | Status: DC | PRN
  Administered 2015-04-14: 50 ug via INTRAVENOUS
  Administered 2015-04-14 (×2): 25 ug via INTRAVENOUS

## 2015-04-14 MED ORDER — MIDAZOLAM HCL 10 MG/2ML IJ SOLN
INTRAMUSCULAR | Status: DC | PRN
  Administered 2015-04-14 (×3): 2 mg via INTRAVENOUS

## 2015-04-14 MED ORDER — MIDAZOLAM HCL 5 MG/5ML IJ SOLN
INTRAMUSCULAR | Status: DC | PRN
  Administered 2015-04-14: 1 mg via INTRAVENOUS
  Administered 2015-04-14: 2 mg via INTRAVENOUS
  Administered 2015-04-14 (×2): 1 mg via INTRAVENOUS

## 2015-04-14 MED ORDER — DIPHENHYDRAMINE HCL 25 MG PO CAPS
ORAL_CAPSULE | ORAL | Status: DC | PRN
  Administered 2015-04-14: 50 mg via ORAL

## 2015-04-14 MED ORDER — DIPHENHYDRAMINE HCL 50 MG/ML IJ SOLN
INTRAMUSCULAR | Status: AC
  Filled 2015-04-14: qty 1

## 2015-04-14 MED ORDER — SODIUM CHLORIDE 0.9 % IV SOLN
INTRAVENOUS | Status: DC
  Administered 2015-04-14: 10:00:00 via INTRAVENOUS

## 2015-04-14 SURGICAL SUPPLY — 1 items: ELECT DEFIB PAD ADLT CADENCE (PAD) ×1 IMPLANT

## 2015-04-14 NOTE — H&P (View-Only) (Signed)
Patient ID: Willie Daniel MRN: [id number], DOB/AGE: 1970-12-15   Admit date: 04/10/2015   Primary Physician: No PCP Per Patient Primary Cardiologist: Dr. Jamse Arn at [place]. Profile:  Mr. Willie Daniel is 45 yo male with PMH of HTN, EtOH abuse, congenital heart disease with VSD s/p repair at age 6 at [facility], L-loop transposition of great arteries (S,L,L), pulmonic stenosis (congenital mild gradient 80mHg by echo 2011) and dextrocardia presented with palpitation and found to be in tachycardia on arrival to ED.  Problem List  Past Medical History  Diagnosis Date  . Hypertension     Past Surgical History  Procedure Laterality Date  . [place] [age][place] Vasectomy  2010    general anesth  . I&d extremity  2004    right leg infection  . Knee arthroscopy with medial menisectomy Left 12/14/2012    Procedure: KNEE ARTHROSCOPY WITH PARTIAL MEDIAL AND LATERAL MENISECTOMY EXTENSIVE SYNOVECTOMY REMOVAL NODULE LEFT KNEE;  Surgeon: DNinetta Lights MD;  [place]  Service: Orthopedics;  Laterality: Left;     Allergies  No Known Allergies  HPI  Mr. Willie Daniel 45yo male with PMH of HTN, congenital heart disease with [facility] [age][facility] L-loop transposition of great arteries (S,L,L), pulmonic stenosis (congenital mild gradient 121mg by echo 2011) and dextrocardia. He has been [facility]  Last echo obtained on 10/16/2014 showed severely depressed systemic ventricular function/morphologically RV approximately 30%, normal size and function of pulmonary ventricle (morphologic LV), moderately enlarged biatrial chamber, mild pulmonary AV valvular regurg.   He had a cardiopulmonary stress test on 12/03/2014 which showed normal functional capability with peak oxygen consumption of 22.7 mL/kg/min, status reflects maximal exertion test as suggested patient has functional capacity similar to normal differential value  for age and gender and body size, pulmonary limitation to exercise capacity suggested by ventilatory reserve exhausted at peak exercise, mild circulatory limitation to exercise suggested but blunted blood pressure augmentation with exercise, post exercise FEV1 measurement without significant decline compared to rest to baseline to suggest exercise-induced bronchoconstriction.   Cardiac MRI obtained on 12/03/2014 showed calculated RV ejection fraction 33%, morphologic right ventricle is anterior and leftward supplies systemic circulation and is severely dilated with moderate hypertrophy and moderately impaired systolic function, interventricular septal wall motion is hypokinetic and a flattened throughout the cardiac cycle consistent with increased right ventricular pressure and volume load, pulmonary valve is abnormally and is mildly compressed anteriorly by aorta, mild pulmonic valve stenosis and mild regurgitation. The left and right coronary arteries arise from the aortic sinuses of Valsalva facing the posterior pulmonary valve (typical anatomy for transposition), rightward coronary artery courses in the anterior interventricular groove consistent with morphologic left coronary artery distribution, left coronary artery courses in the left atrioventricular groove consistent with morphologic right coronary artery distribution. Differential blood flow demonstrates 72% flow to the right lung, 28% flow to the left lung. Compared to the previous image, calculated RV ejection fraction has been down from 46%  His last follow-up was Dr. BaMichaelle Birksas on 04/10/2015, at which time he planned to do MRI in one year and cardiopulmonary stress test and BNP in 1 year. Patient was recommended to continue aerobic exercise and weight loss attempt, in order to optimize his opportunity if transplant become a necessity in the future. He does have alcohol abuse problem, and drinks large quantity of  alcohol 4-5 days a week (6-12 pack  each day). However despite his history of congenital heart disease, he never had any kind of arrhythmia in the past. He woke up around 4 AM this morning having chest palpitation. He presented to [facility], telemetry shows he was in wide complex tachycardia. Cardiology was consulted, while in the ED, he was given 6 mg adenosine and followed by 12 mg adenosine followed by another 12 mg adenosine without his heart rate slowing down. His heart rate eventually slowed down with 5 mg Lopressor. Dr. Lovena Le has reviewed his telemetry, felt the patient is likely in 2-1 atrial flutter.    Home Medications  Prior to Admission medications   Medication Sig Start Date End Date Taking? Authorizing Provider  benazepril (LOTENSIN) 20 MG tablet Take 20 mg by mouth daily.    Historical Provider, MD  carvedilol (COREG) 6.25 MG tablet Take 6.25 mg by mouth 2 (two) times daily with a meal.    Historical Provider, MD  oxyCODONE-acetaminophen (PERCOCET) 10-325 MG per tablet Take 1-2 tablets by mouth every 4 (four) hours as needed for pain. 12/14/12   Bayard Males, PA-C    Family History  Family History  Problem Relation Age of Onset  . Multiple myeloma Mother     Social History  Social History   Social History  . Marital Status: Married    Spouse Name: N/A  . Number of Children: N/A  . Years of Education: N/A   Occupational History  . Not on file.   Social History Main Topics  . Smoking status: Never Smoker   . Smokeless tobacco: Not on file  . Alcohol Use: 0.0 oz/week    0 Standard drinks or equivalent per week     Comment: quit 12 pack per day, 4-5 times a week  . Drug Use: No  . Sexual Activity: Not on file   Other Topics Concern  . Not on file   Social History Narrative     Review of Systems General:  No chills, fever, night sweats or weight changes.  Cardiovascular:  No chest pain, dyspnea on exertion, edema, orthopnea, paroxysmal nocturnal dyspnea.  +palpitations Dermatological: No rash, lesions/masses Respiratory: No cough, dyspnea Urologic: No hematuria, dysuria Abdominal:   No vomiting, diarrhea, bright red blood per rectum, melena, or hematemesis +nausea Neurologic:  No visual changes, wkns, changes in mental status. All other systems reviewed and are otherwise negative except as noted above.  Physical Exam  Blood pressure 116/87, pulse 117, temperature 98.5 F (36.9 C), temperature source Oral, resp. rate 26, height '6\' 2"'  (1.88 m), weight 280 lb (127.007 kg), SpO2 97 %.  General: Pleasant, NAD Psych: Normal affect. Neuro: Alert and oriented X 3. Moves all extremities spontaneously. HEENT: Normal  Neck: Supple without bruits or JVD. Lungs:  Resp regular and unlabored, CTA. Heart: tachycardic no s3, s4, or murmurs. Abdomen: Soft, non-tender, non-distended, BS + x 4.  Extremities: No clubbing, cyanosis or edema. DP/PT/Radials 2+ and equal bilaterally.  Labs  Troponin (Point of Care Test) No results for input(s): TROPIPOC in the last 72 hours. No results for input(s): CKTOTAL, CKMB, TROPONINI in the last 72 hours. Lab Results  Component Value Date   HGB 14.6 12/14/2012   HCT 43.0 12/14/2012   No results for input(s): NA, K, CL, CO2, BUN, CREATININE, CALCIUM, PROT, BILITOT, ALKPHOS, ALT, AST, GLUCOSE in the last 168 hours.  Invalid input(s): LABALBU No results found for: CHOL, HDL, LDLCALC, TRIG No results found for:  DDIMER   Radiology/Studies  No results found.  ECG  Possible 2:1 atrial flutter  Echocardiogram 10/16/2014 done at [facility]  1. Dilated, severly depressed systemic ventricular function. EF approximately 30%. (morphologic RV) 2. Normal size and function of pulmonary ventricle. (morphologic LV) 3. Both atria are moderately enlarged. 4. Trivial systemic AV valvular regurgitation. 5. Mild pulmonary AV valvular regugitation; 3.1 m/sec.. Estimated pulmonary ventricular systolic pressure= [id number]. 6. Dilated  IVC that collapses. Dilated hepatic veins with flow reversal. 7. No aortic regurgitation. 8. Mild pulmonary valve regurgitation. 9. The pulmonary valve is mildly thickened and appears to open, but continuous wave Doppler through the PV into the PA is 3.63msecond. Peak gradient= 334mg, mean gradient= 2269m. Main PA smaller calibre vessel compaired with aorta. 10. From the SSN, there is turbulence noted by color Doppler in the PA.      ASSESSMENT AND PLAN  1. Wide complex tachycardia likely 2:1 atrial flutter  - likely related combination of congenital heart disease and his significant amount of EtOH abuse (drink 4-5 days a week with 6-12 pack each time)  - will ask EP to see, discuss with Dr. VarIrish Lackiven his EF 30s on last cardiac MRI, IV diltiazem not ideal choice, alternative is increase coreg or change to Toprol XL. Will hold ACE and increase BB for now, but he will eventually need ACEI added back on HR is better  - will discuss with patient systemic anticoagulation therapy  2. Congenital heart disease [facility] 88 [facility] great [facility] (S,L,L), pulmonic stenosis (congenital mild gradient 98m32mby echo 2011) and dextrocardia  3. Chronic systolic heart failure: euvolemic on exam  - RV connected to systemic circulation, RV EF on last cardiac MRI 11/2014 30%  4. HTN  5. EtOH abuse: CIWA protocol, monitor for withdraw  SignHilbert Corrigan-C 04/10/2015, 12:31 PM   I have examined the patient and reviewed assessment and plan and discussed with patient.  Agree with above as stated.  I saw the patient emergently in the emergency room due to supraventricular tachycardia with a heart rate around 200 which would not abate with IV adenosine. His symptoms started at 4:00 this morning. He has never had this kind of symptom before that he can remember. When I saw the patient, his blood pressure was 91/55. He was not lightheaded but his heart rate was  around 200. He had received adenosine 6 mg 1 and then 12 mg 2. I was told that there was a brief slowing of his heart rate but then it came back immediately. On exam, he appeared comfortable. He was tachycardic. No wheezing. No lower extremity edema. I immediately performed carotid massage and his heart rate slowed down into the 140s. There appeared to be flutter waves but they were not prominent. We then gave the patient 5 mg of IV Lopressor. His heart rate then came down and stayed at around 102. It appeared he was in 2-1 atrial flutter.  Upon further interviewing of the patient, he has had significant congenital heart disease. He had transposition of the great vessels. He had surgery at age 89. C97rdiac [facility] revealed that his right ventricle supplies his systemic circulation. The right ventricular ejection fraction had dropped to 33%. His left ventricle supplies his pulmonary circulation and had a normal ejection fraction.  I spoke to Dr. TaylLovena Le will place the patient on IV Cardizem. Will start anticoagulation for now with low molecular weight heparin.  In the event he requires a cardioversion, I did not want him to have to wait for multiple doses of an oral anticoagulant before cardioversion. He does drink alcohol fairly heavily. He states he can drink at least a 6-12 pack daily. This may have contributed to his arrhythmia.  Long-term, it will be important for him to cut back on his alcohol use, especially if his going to require long-term anticoagulation.  For now, will continue with IV Cardizem. Electrophysiology will see the patient and give further recommendations.  Continue carvedilol. May have to hold his ACE inhibitor at this point due to the Cardizem being started.  We'll also try to forward records to [facility] to keep his regular cardiologist in the loop.  Critical care time 40 minutes   Olla Delancey S.

## 2015-04-14 NOTE — Progress Notes (Signed)
Waking up on own now. Lifting head up off bed. Talking appropriately. O2 off.

## 2015-04-14 NOTE — Progress Notes (Signed)
  Echocardiogram Echocardiogram Transesophageal has been performed.  Delcie Roch 04/14/2015, 11:00 AM

## 2015-04-14 NOTE — Interval H&P Note (Signed)
History and Physical Interval Note:  04/14/2015 11:08 AM  Willie Daniel  has presented today for surgery, with the diagnosis of syncope  The various methods of treatment have been discussed with the patient and family. After consideration of risks, benefits and other options for treatment, the patient has consented to  Procedure(s): Cardioversion (N/A) as a surgical intervention .  The patient's history has been reviewed, patient examined, no change in status, stable for surgery.  I have reviewed the patient's chart and labs.  Questions were answered to the patient's satisfaction.    TEE neg and compliant with anticoagulation  Sherryl Manges

## 2015-04-14 NOTE — Interval H&P Note (Signed)
History and Physical Interval Note:  04/14/2015 10:03 AM  Willie Daniel  has presented today for surgery, with the diagnosis of afib  The various methods of treatment have been discussed with the patient and family. After consideration of risks, benefits and other options for treatment, the patient has consented to  Procedure(s): TRANSESOPHAGEAL ECHOCARDIOGRAM (TEE) (N/A) as a surgical intervention .  The patient's history has been reviewed, patient examined, no change in status, stable for surgery.  I have reviewed the patient's chart and labs.  Questions were answered to the patient's satisfaction.     Charlton Haws

## 2015-04-14 NOTE — Progress Notes (Signed)
Late entry CHADSVASC 1

## 2015-04-14 NOTE — CV Procedure (Signed)
TEE:  During this procedure the patient is administered a total of Versed 7 mg and Fentanyl 50 ug  And 50 mg benedryl to achieve and maintain moderate conscious sedation.  The patient's heart rate, blood pressure, and oxygen saturation are monitored continuously during the procedure. The period of conscious sedation is 44 minutes, of which I was present face-to-face 100% of this time.  Dextrocardia with L transposition AV anterior and to left of pulmonary valve Systemic ventricle  (RV) dilated diffuse hypokinesis EF 15-20% Mild systemic AV valve regurgitation Moderate pulmonary AV valve regurgitation with prolpase Anterior compression of PV with mild to moderate PS mean gradient 10 mmHg Peak 22 mmHg No LAA thrombus No ASD Non systemic ventricle  (LV) normal size and function   Ok to proceed with [facility] with Dr Conception Oms

## 2015-04-14 NOTE — Discharge Instructions (Signed)
Electrical Cardioversion Electrical cardioversion is the delivery of a jolt of electricity to change the rhythm of the heart. Sticky patches or metal paddles are placed on the chest to deliver the electricity from a device. This is done to restore a normal rhythm. A rhythm that is too fast or not regular keeps the heart from pumping well. Electrical cardioversion is done in an emergency if:   There is low or no blood pressure as a result of the heart rhythm.   Normal rhythm must be restored as fast as possible to protect the brain and heart from further damage.   It may save a life. Cardioversion may be done for heart rhythms that are not immediately life threatening, such as atrial fibrillation or flutter, in which:   The heart is beating too fast or is not regular.   Medicine to change the rhythm has not worked.   It is safe to wait in order to allow time for preparation.  Symptoms of the abnormal rhythm are bothersome.  The risk of stroke and other serious problems can be reduced. LET [facility] CARE PROVIDER KNOW ABOUT:   Any allergies you have.  All medicines you are taking, including vitamins, herbs, eye drops, creams, and over-the-counter medicines.  Previous problems you or members of your family have had with the use of anesthetics.   Any blood disorders you have.   Previous surgeries you have had.   Medical conditions you have. RISKS AND COMPLICATIONS  Generally, this is a safe procedure. However, problems can occur and include:   Breathing problems related to the anesthetic used.  A blood clot that breaks free and travels to other parts of your body. This could cause a stroke or other problems. The risk of this is lowered by use of blood-thinning medicine (anticoagulant) prior to the procedure.  Cardiac arrest (rare). BEFORE THE PROCEDURE   You may have tests to detect blood clots in your heart and to evaluate heart function.  You may start taking  anticoagulants so your blood does not clot as easily.   Medicines may be given to help stabilize your heart rate and rhythm. PROCEDURE  You will be given medicine through an IV tube to reduce discomfort and make you sleepy (sedative).   An electrical shock will be delivered. AFTER THE PROCEDURE Your heart rhythm will be watched to make sure it does not change.    This information is not intended to replace advice given to you by your health care provider. Make sure you discuss any questions you have with your health care provider.   Document Released: 02/12/2002 Document Revised: 03/15/2014 Document Reviewed: 09/06/2012 Elsevier Interactive Patient Education 2016 [employer]. Electrical Cardioversion, Care After Refer to this sheet in the next few weeks. These instructions provide you with information on caring for yourself after your procedure. Your health care provider may also give you more specific instructions. Your treatment has been planned according to current medical practices, but problems sometimes occur. Call your health care provider if you have any problems or questions after your procedure. WHAT TO EXPECT AFTER THE PROCEDURE After your procedure, it is typical to have the following sensations:  Some redness on the skin where the shocks were delivered. If this is tender, a sunburn lotion or hydrocortisone cream may help.  Possible return of an abnormal heart rhythm within hours or days after the procedure. HOME CARE INSTRUCTIONS  Take medicines only as directed by your health care provider. Be sure you  understand how and when to take your medicine.  Learn how to feel your pulse and check it often.  Limit your activity for 48 hours after the procedure or as directed by your health care provider.  Avoid or minimize caffeine and other stimulants as directed by your health care provider. SEEK MEDICAL CARE IF:  You feel like your heart is beating too fast or your pulse  is not regular.  You have any questions about your medicines.  You have bleeding that will not stop. SEEK IMMEDIATE MEDICAL CARE IF:  You are dizzy or feel faint.  It is hard to breathe or you feel short of breath.  There is a change in discomfort in your chest.  Your speech is slurred or you have trouble moving an arm or leg on one side of your body.  You get a serious muscle cramp that does not go away.  Your fingers or toes turn cold or blue.   This information is not intended to replace advice given to you by your health care provider. Make sure you discuss any questions you have with your health care provider.   Document Released: 12/13/2012 Document Revised: 03/15/2014 Document Reviewed: 12/13/2012 Elsevier Interactive Patient Education [employer]

## 2015-04-14 NOTE — Progress Notes (Signed)
Pt transferred to cath lab room 6

## 2015-04-14 NOTE — Progress Notes (Signed)
Wife in to see. Patient remains sedated, will arouse to name but does not stay awake for any time.

## 2015-04-14 NOTE — H&P (View-Only) (Signed)
Have spoken with the Pt  He would prefer to go home Rather than wait until monday as there is no availability for TEE We will discharge him on apixoban and dilt 120 bid Return on Monday for TEE DCCV He will followup with Dr Earma Reading at [facility]

## 2015-04-15 MED FILL — Midazolam HCl Inj 10 MG/2ML (Base Equivalent): INTRAMUSCULAR | Qty: 2 | Status: AC

## 2015-04-16 ENCOUNTER — Encounter (HOSPITAL_COMMUNITY): Payer: Self-pay | Admitting: Cardiovascular Disease

## 2015-05-01 ENCOUNTER — Institutional Professional Consult (permissible substitution): Payer: BLUE CROSS/BLUE SHIELD | Admitting: Neurology

## 2015-05-02 ENCOUNTER — Ambulatory Visit (INDEPENDENT_AMBULATORY_CARE_PROVIDER_SITE_OTHER): Payer: BLUE CROSS/BLUE SHIELD | Admitting: Neurology

## 2015-05-02 ENCOUNTER — Encounter: Payer: Self-pay | Admitting: Neurology

## 2015-05-02 VITALS — BP 137/88 | HR 72 | Resp 16 | Ht 74.0 in | Wt 289.0 lb

## 2015-05-02 DIAGNOSIS — I484 Atypical atrial flutter: Secondary | ICD-10-CM

## 2015-05-02 DIAGNOSIS — R0683 Snoring: Secondary | ICD-10-CM

## 2015-05-02 DIAGNOSIS — Q210 Ventricular septal defect: Secondary | ICD-10-CM | POA: Diagnosis not present

## 2015-05-02 DIAGNOSIS — Q203 Discordant ventriculoarterial connection: Secondary | ICD-10-CM

## 2015-05-02 DIAGNOSIS — E662 Morbid (severe) obesity with alveolar hypoventilation: Secondary | ICD-10-CM | POA: Diagnosis not present

## 2015-05-02 NOTE — Patient Instructions (Signed)
Please also heed any additional desaturation and split criteria, per patient's insurance.  Sleep Apnea Sleep apnea is disorder that affects a person's sleep. A person with sleep apnea has abnormal pauses in their breathing when they sleep. It is hard for them to get a good sleep. This makes a person tired during the day. It also can lead to other physical problems. There are three types of sleep apnea. One type is when breathing stops for a short time because your airway is blocked (obstructive sleep apnea). Another type is when the brain sometimes fails to give the normal signal to breathe to the muscles that control your breathing (central sleep apnea). The third type is a combination of the other two types. HOME CARE  Do not sleep on your back. Try to sleep on your side.  Take all medicine as told by your doctor.  Avoid alcohol, calming medicines (sedatives), and depressant drugs.  Try to lose weight if you are overweight. Talk to your doctor about a healthy weight goal. Your doctor may have you use a device that helps to open your airway. It can help you get the air that you need. It is called a positive airway pressure (PAP) device. There are three types of PAP devices:  Continuous positive airway pressure (CPAP) device.  Nasal expiratory positive airway pressure (EPAP) device.  Bilevel positive airway pressure (BPAP) device. MAKE SURE YOU:  Understand these instructions.  Will watch your condition.  Will get help right away if you are not doing well or get worse.   This information is not intended to replace advice given to you by your health care provider. Make sure you discuss any questions you have with your health care provider.   Document Released: 12/02/2007 Document Revised: 03/15/2014 Document Reviewed: 06/26/2011 Elsevier Interactive Patient Education [employer]

## 2015-05-02 NOTE — Progress Notes (Signed)
SLEEP MEDICINE CLINIC   Provider:  Larey Seat, M D  Referring Provider: Mackie Pai* MD at [place] Physician:  No PCP Per Patient  Chief Complaint  Patient presents with  . New Evaluation    Rm 11 alone. Snores at night. Wife notices and his PCP wanted him to be evaluated. He does not think he has a problem.     HPI:  Willie Daniel is a 45 y.o. male , seen here as a referral from Dr. Michaelle Birks for a sleep consultation,   Chief complaint according to patient : " My wife stated I snore" , and "my sleep is less restoring and I get fatigued.  "  [name] reports that he has a regular work hours works often long hours, and therefore has not been able to establish a routine sleep time. 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 His systemic ventricle has been enlarged over the last 6 months and he has been woken by a rapid heart rate and palpitations was diagnosed with atrial flutter when he presented to [facility]. He was treated with Bonney Roussel was, diltiazem and underwent a DC CV under the guidance of Dr. Virl Axe, cardio- electro-physiologist. He remains on Naprosyn as needed, indomethacin with meals, has also hydrocodone for pain ordered as a when necessary medication, Coreg, Lotensin and the above-mentioned and appears. He resides near [facility] and the sleep study was more convenient to be performed here in [place] than at [place], [place]. He works as a Financial controller.   Sleep habits are as follows:  He comes home form work between 8 and 9 PM and will have dinner with his wife and 26 year old son , he rests in the den watching some TV and at times already falling asleep before he goes to bed at about 11.30 PM. He watches TVin the bedroom, after 11.30 his bedroom is cool and quiet  and dark.  He reports that the first time he ever woke up from out of sleep by palpitations was when he went to [facility] the same night a couple of weeks ago. Usually he sleeps through the night he is a habitual snore according to his wife who also has noted him to choke sometimes gasp for air. He will have one or 2 bathroom breaks at night. He does not wake up with headaches or from headaches ,he does not get diaphoretic at night. He estimates an average of 6 hours nocturnal sleep, and rises in the morning usually 6:30 AM-his wife will usually wake him. He may sleep on his side or on his back, he sleeps on only one pillow.  During the workweek he cannot catch a nap in daytime due to his work schedule. He walks a lot between the car lot in the office. He has exposure to natural daylight during the day. He becomes fatigued around 2 PM after lunchtime. He also does not take any naps on week ends.    Sleep medical history and family sleep history:  Parents are both deceased. Father when he was 46 , and his mother of multiple myeloma about 6 years ago.  Social history: ETOH- on week ends,  beer about 4-6. Caffeine - occasional Soda once or twice a week-, not coffee, rarely tea.  No tobaco use.  Review of Systems: Out of a complete 14 system review, the patient complains of only the following symptoms, and all other reviewed systems are negative. Snoring, dry mouth.   Epworth score 5, Fatigue severity score 43  , depression score 0. N/a    Social History   Social History  . Marital Status: Married    Spouse Name: [place] "Maudie Mercury"  . Number of Children: 2  . Years of Education: 14   Occupational History  . [employer]     Social History Main Topics  . Smoking status: Never Smoker   . Smokeless tobacco: Not on file  . Alcohol Use: 0.0 oz/week    0 Standard drinks or equivalent per week     Comment: quit 12 pack per day, 4-5 times a week  . Drug Use: No  . Sexual Activity: Not on  file   Other Topics Concern  . Not on file   Social History Narrative   Lives at home with wife and kids   Caffeine use: Tea/soda occass (2-3 per week)    Family History  Problem Relation Age of Onset  . Multiple myeloma Mother     Past Medical History  Diagnosis Date  . Hypertension     Past Surgical History  Procedure Laterality Date  . Cardiac surgery  1976    age 70 at [place]  . Vasectomy  2010    general anesth  . I&d extremity  2004    right leg infection  . Knee arthroscopy with medial menisectomy Left 12/14/2012    Procedure: KNEE ARTHROSCOPY WITH PARTIAL MEDIAL AND LATERAL MENISECTOMY EXTENSIVE SYNOVECTOMY REMOVAL NODULE LEFT KNEE;  Surgeon: Ninetta Lights, MD;  Location: [facility];  Service: Orthopedics;  Laterality: Left;  . Electrophysiologic study N/A 04/14/2015    Procedure: Cardioversion;  Surgeon: Deboraha Sprang, MD;  Location: [facility];  Service: Cardiovascular;  Laterality: N/A;  . Tee without cardioversion N/A 04/14/2015    Procedure: TRANSESOPHAGEAL ECHOCARDIOGRAM (TEE);  Surgeon: Josue Hector, MD;  [facility];  Service: Cardiovascular;  Laterality: N/A;    Current Outpatient Prescriptions  Medication Sig Dispense Refill  . apixaban (ELIQUIS) 5 MG TABS tablet Take 1 tablet (5 mg total) by mouth 2 (two) times daily. 60 tablet 3  . benazepril (LOTENSIN) 10 MG tablet Take 1 tablet (10 mg total) by mouth at bedtime. 30 tablet 3  . carvedilol (COREG) 6.25 MG tablet Take 6.25 mg by mouth daily.     Marland Kitchen diltiazem (CARDIZEM LA) 120 MG 24 hr tablet Take 1 tablet (120 mg total) by mouth 2 (two) times daily. 60 tablet 3  . indomethacin (INDOCIN) 50 MG capsule Take 50 mg by mouth daily as needed for mild pain or moderate pain.   3   No current facility-administered medications for this visit.    Allergies as of 05/02/2015  . (No Known Allergies)    Vitals: BP 137/88 mmHg  Pulse 72  Resp 16  Ht '6\' 2"'  (1.88 m)  Wt 289 lb  (131.09 kg)  BMI 37.09 kg/m2 Last Weight:  Wt Readings from Last 1 Encounters:  05/02/15 289 lb (131.09 kg)   JYN:WGNF mass index is 37.09 kg/(m^2).     Last Height:   Ht Readings from Last 1 Encounters:  05/02/15 '6\' 2"'  (1.88 m)    Physical exam:  General: The patient is awake, alert and appears not in acute distress. The patient is well groomed. Head: Normocephalic, atraumatic.  Neck is supple. Mallampati 4,  neck circumference:19.5. Nasal airflow unrestricted - rhinitis, TMJ is not evident . Retrognathia is not seen.  Cardiovascular:  Regular rate and rhythm ,  without distended neck veins. The patient has louder heart beats on his right than on his left, due to his congenitally existing ounce position of the great arteries right ventricle ejection fraction is over 50% left ejection fraction over 50% he is status post closure of a ventriculoseptal defect. Respiratory: Lungs are clear to auscultation. Skin:  With ankle edema, and gout.  Trunk: BMI is elevated. The patient's posture is erect   Neurologic exam : The patient is awake and alert, oriented to place and time.   Memory subjective described as intact.  Attention span & concentration ability appears normal.  Speech is fluent,  without  dysarthria, dysphonia or aphasia.  Mood and affect are appropriate.  Cranial nerves: Pupils are equal and briskly reactive to light. Funduscopic exam without  evidence of pallor or edema. Extraocular movements  in vertical and horizontal planes intact and without nystagmus. Visual fields by finger perimetry are intact. Hearing to finger rub intact.   Facial sensation intact to fine touch.  Facial motor strength is symmetric and tongue and uvula move midline. Shoulder shrug was symmetrical.   Motor exam:  Normal tone, muscle bulk and symmetric strength in all extremities. Sensory:  Fine touch, pinprick and vibration were tested in all extremities. Proprioception tested in the upper extremities  was normal. Coordination: Rapid alternating movements in the fingers/hands was normal. Finger-to-nose maneuver normal without evidence of ataxia, dysmetria or tremor.  Gait and station: Patient walks without assistive device and is able unassisted to climb up to the exam table. Strength within normal limits.  Stance is stable and normal.    Deep tendon reflexes: in the  upper and lower extremities are symmetric and intact. Babinski maneuver response is  downgoing.  The patient was advised of the nature of the diagnosed sleep disorder , the treatment options and risks for general a health and wellness arising from not treating the condition.  I spent more than 60 minutes of face to face time with the patient. Greater than 50% of time was spent in counseling and coordination of care. We have discussed the diagnosis and differential and I answered the patient's questions.     Assessment:  After physical and neurologic examination, review of laboratory studies,  Personal review of imaging studies, reports of other /same  Imaging studies ,  Results of polysomnography/ neurophysiology testing and pre-existing records as far as provided in visit., my assessment is : Mr. Trowbridge had one episode of documented atrial flutter, and has a highly unusual cardiovascular anatomy with his transposition of the great vessels. Palpitations have occurred but has only woke him out of sleep once. The ventriculoseptal defect, dextrocardia congenital pulmonary stenosis all contribute to a higher risk of obstructive sleep apnea in the context of obesity and witnessed snoring and apneas per spouse.  I will order a couple of the and will especially he any signs of hypoxemia. Should the patient have enough apnea to be titrated to CPAP we will also heed  special attention to persisting hypersomnia while on CPAP- and may titrate to oxygen. The technologist will receive detailed instructions.   1) Patient with high cardiovascular  risk of OSA- Status of transposition of the great arteries.  Recent flutter attack.   2)  Obesity - this increased/ influenced  His risk for apnea- high grate  mallampatti , neck.   3) Stress, at the job. He is the main breadwinner. He feels that he is under constant performance review.     Plan:  Treatment plan and additional workup : SPLIt night polysomnography with Co2 measures .  Obesity hypoventilation can be present. High risk of lower oxygen level.  May titrate to CPAP and if hypoxemia is present , may titrate to Oxygen.  RV ; after SPLIT.   Asencion Partridge Bastian Andreoli MD  05/02/2015   CC: Mackie Pai, [place] [address] [place], [place] [address]

## 2015-05-19 ENCOUNTER — Ambulatory Visit (INDEPENDENT_AMBULATORY_CARE_PROVIDER_SITE_OTHER): Payer: BLUE CROSS/BLUE SHIELD | Admitting: Neurology

## 2015-05-19 DIAGNOSIS — E662 Morbid (severe) obesity with alveolar hypoventilation: Secondary | ICD-10-CM

## 2015-05-19 DIAGNOSIS — R0683 Snoring: Secondary | ICD-10-CM

## 2015-05-19 DIAGNOSIS — I484 Atypical atrial flutter: Secondary | ICD-10-CM

## 2015-05-19 DIAGNOSIS — G4733 Obstructive sleep apnea (adult) (pediatric): Secondary | ICD-10-CM | POA: Diagnosis not present

## 2015-05-19 DIAGNOSIS — Q210 Ventricular septal defect: Secondary | ICD-10-CM

## 2015-05-19 DIAGNOSIS — Q203 Discordant ventriculoarterial connection: Secondary | ICD-10-CM

## 2015-05-19 NOTE — Sleep Study (Signed)
Please see the scanned sleep study interpretation located in the Procedure tab within the Chart Review section. 

## 2015-05-22 ENCOUNTER — Institutional Professional Consult (permissible substitution): Payer: BLUE CROSS/BLUE SHIELD | Admitting: Neurology

## 2015-05-22 ENCOUNTER — Telehealth: Payer: Self-pay

## 2015-05-22 DIAGNOSIS — G4733 Obstructive sleep apnea (adult) (pediatric): Secondary | ICD-10-CM

## 2015-05-22 NOTE — Telephone Encounter (Signed)
I spoke to pt and advised him that his sleep study showed severe osa and that Dr. Vickey Hugerohmeier recommends starting a cpap. Pt is agreeable to starting a cpap. I advised pt to use his cpap at least four or more hours per night. [facility]. A follow up appt was made for 5/11 at 2:30. Pt verbalized understanding.

## 2015-05-22 NOTE — Telephone Encounter (Signed)
I called pt to discuss sleep study results. No answer, left a message asking him to call me back.    

## 2015-05-22 NOTE — Telephone Encounter (Signed)
Patient is returning your call.  

## 2015-06-01 ENCOUNTER — Encounter: Payer: Self-pay | Admitting: *Deleted

## 2015-06-04 ENCOUNTER — Telehealth: Payer: Self-pay

## 2015-06-04 NOTE — Telephone Encounter (Signed)
[facility] has been unable to reach pt to get set up on cpap. I called the pt and he answered right away. [facility]. He is requesting that they use his cell number. Will alert [facility].

## 2015-06-04 NOTE — Telephone Encounter (Signed)
-----   Message from Alexis FrockMary A Ozimek sent at 06/04/2015 11:18 AM EDT ----- Regarding: Unable to Reach  Just wanted to let you and Dr. Sunday Shamsohmeirer know that our RT team has been unsuccessful in getting a hold of patient to set up CPAP.  They have mailed him a no contact letter. Thanks!

## 2015-06-19 DIAGNOSIS — G4733 Obstructive sleep apnea (adult) (pediatric): Secondary | ICD-10-CM | POA: Diagnosis not present

## 2015-07-14 DIAGNOSIS — G4733 Obstructive sleep apnea (adult) (pediatric): Secondary | ICD-10-CM | POA: Diagnosis not present

## 2015-07-17 ENCOUNTER — Encounter: Payer: Self-pay | Admitting: Neurology

## 2015-07-17 ENCOUNTER — Ambulatory Visit (INDEPENDENT_AMBULATORY_CARE_PROVIDER_SITE_OTHER): Payer: BLUE CROSS/BLUE SHIELD | Admitting: Neurology

## 2015-07-17 VITALS — BP 120/82 | HR 80 | Resp 20 | Ht 74.0 in | Wt 286.0 lb

## 2015-07-17 DIAGNOSIS — G4733 Obstructive sleep apnea (adult) (pediatric): Secondary | ICD-10-CM | POA: Diagnosis not present

## 2015-07-17 DIAGNOSIS — R0902 Hypoxemia: Secondary | ICD-10-CM | POA: Diagnosis not present

## 2015-07-17 DIAGNOSIS — Q203 Discordant ventriculoarterial connection: Secondary | ICD-10-CM | POA: Diagnosis not present

## 2015-07-17 DIAGNOSIS — Z9989 Dependence on other enabling machines and devices: Secondary | ICD-10-CM

## 2015-07-17 HISTORY — DX: Obstructive sleep apnea (adult) (pediatric): G47.33

## 2015-07-17 NOTE — Progress Notes (Signed)
SLEEP MEDICINE CLINIC   Provider:  Larey Seat, M D  Referring Provider: No ref. provider found MD at [facility] Physician:    Chief Complaint  Patient presents with  . Follow-up    feels a lot better on cpap, rm 11, alone    HPI:  Willie Daniel is a 45 y.o. male , seen here as a referral from Dr.  for a sleep consultation,   Chief complaint according to patient : " My wife stated I snore" , and "my sleep is less restoring and I get fatigued.  "  Willie Daniel reports that he has a regular work hours works often long hours, and therefore has not been able to establish a routine sleep time. 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 His systemic ventricle has been enlarged over the last 6 months and he has been woken by a rapid heart rate and palpitations was diagnosed with atrial flutter when he presented to [facility]. He was treated with Bonney Roussel was, diltiazem and underwent a DC CV under the guidance of Dr. Virl Axe, cardio- electro-physiologist. He remains on Naprosyn as needed, indomethacin with meals, has also hydrocodone for pain ordered as a when necessary medication, Coreg, Lotensin and the above-mentioned and appears. He resides near [facility] and the sleep study was more convenient to be performed here in [place] than at [place], [place]. He works as a Financial controller.  Sleep habits are as follows: He comes home form work between 8 and 9 PM and will have dinner with his wife and 85 year old son , he rests in the den watching some TV and at times already falling asleep before he goes to bed at about 11.30 PM. He watches TVin the bedroom, after 11.30 his bedroom is cool and quiet and dark.  He reports that the first time he ever woke up from out of sleep by palpitations was when he went to [facility] the same night a couple of weeks ago. Usually he sleeps through the night he is a habitual snore according to his wife who also has noted him to choke sometimes gasp for air. He will have one or 2 bathroom breaks at night. He does not wake up with headaches or from headaches ,he does not get diaphoretic at night. He estimates an average of 6 hours nocturnal sleep, and rises in the morning usually 6:30 AM-his wife will usually wake him. He may sleep on his side or on his back, he sleeps on only one pillow. During the work-week he cannot catch a nap in daytime due to his work schedule. He walks a lot between the car lot in the office. He has exposure to natural daylight during the day. He becomes fatigued around 2 PM after lunchtime. He also does not take any naps on week ends.  Sleep medical history and family sleep history:  Parents are both deceased. Father when he was 26 , and his mother of multiple myeloma about 6 years ago. Social history: ETOH- on week ends,  beer about 4-6. Caffeine - occasional Soda once or twice a week-, not coffee, rarely tea.  No tobaco use.   Interval history from 07/17/2015, Willie Daniel is here today to follow-up on his recent sleep study dated 05/19/2015. He had indeed a shocking AHI  of 89.8 and an RDI of 90.2. There was no supine exenteration or REM accentuation noted. He did not retain CO2. He had a total of 104 minutes of oxygen desaturation with a nadir of 58%. Based on these results the study was split for a CPAP titration. Pressures between 5 and 15 cm water were explored and the 15 cm water pressure achieved an AHI of 0.0. The patient developed some periodic limb movements but the arousals related to these were only 1.3 per hour. He was placed on an AutoPap between 5 and 20 cm water with a quadro fullface mask medium size. I'm able today to review his outer titration. The patient is compliant has used the machine for 27 out of 30 days and for 25 days over 4 hours.  Total daily user time is 5-1/2 hours, residual AHI is 6.9 all residual apneas are obstructive. 91st percentile pressure is 11.3 he does have some significant air leaks. He has quite significant nasal congestion and is forced to mouth breathe which probably contributes to the airleak. The apnea index however at home has been reduced from a peak at around April 16 and the first 10 days of May show a significant reduction in AHI. He has tightened his face gear around May 1 and this may be reflected and lower AHI now, and  lower air leaks.  He reported FSS at 30, Epworth 5 .      Review of Systems: Out of a complete 14 system review, the patient complains of only the following symptoms, and all other reviewed systems are negative. Snoring, dry mouth.   Epworth score 5, Fatigue severity score  30 from 43  , depression score 0. N/a    Social History   Social History  . Marital Status: Married    Spouse Name: [place] "Maudie Mercury"  . Number of Children: 2  . Years of Education: 14   Occupational History  . [employer]     Social History Main Topics  . Smoking status: Never Smoker   . Smokeless tobacco: Not on file  . Alcohol Use: 0.0 oz/week    0 Standard drinks or equivalent per week     Comment: quit 12 pack per day, 4-5 times a week  . Drug Use: No  . Sexual Activity: Not on file   Other Topics Concern  . Not on file   Social History Narrative   Lives at home with wife and kids   Caffeine use: Tea/soda occass (2-3 per week)    Family History  Problem Relation Age of Onset  . Multiple myeloma Mother     Past Medical History  Diagnosis Date  . Hypertension     Past Surgical History  Procedure Laterality Date  . Cardiac surgery  1976    age 19 at [place]  . Vasectomy  2010    general anesth  . I&d extremity  2004    right leg infection  . Knee arthroscopy with medial menisectomy Left 12/14/2012    Procedure: KNEE ARTHROSCOPY WITH PARTIAL MEDIAL AND LATERAL MENISECTOMY  EXTENSIVE SYNOVECTOMY REMOVAL NODULE LEFT KNEE;  Surgeon: Ninetta Lights, MD;  Location: [facility];  Service: Orthopedics;  Laterality: Left;  . Electrophysiologic study N/A 04/14/2015    Procedure: Cardioversion;  Surgeon: Deboraha Sprang, MD;  Location: [facility];  Service: Cardiovascular;  Laterality: N/A;  . Tee without cardioversion N/A 04/14/2015    Procedure: TRANSESOPHAGEAL ECHOCARDIOGRAM (TEE);  Surgeon: Josue Hector,  MD;  Location: [facility];  Service: Cardiovascular;  Laterality: N/A;    Current Outpatient Prescriptions  Medication Sig Dispense Refill  . apixaban (ELIQUIS) 5 MG TABS tablet Take 1 tablet (5 mg total) by mouth 2 (two) times daily. 60 tablet 3  . benazepril (LOTENSIN) 10 MG tablet Take 1 tablet (10 mg total) by mouth at bedtime. 30 tablet 3  . carvedilol (COREG) 6.25 MG tablet Take 6.25 mg by mouth 2 (two) times daily with a meal.     . diltiazem (CARDIZEM LA) 120 MG 24 hr tablet Take 120 mg by mouth daily.     . indomethacin (INDOCIN) 50 MG capsule Take 50 mg by mouth daily as needed for mild pain or moderate pain.   3   No current facility-administered medications for this visit.    Allergies as of 07/17/2015  . (No Known Allergies)    Vitals: BP 120/82 mmHg  Pulse 80  Resp 20  Ht _0  (1.88 m)  Wt 286 lb (129.729 kg)  BMI 36.70 kg/m2 Last Weight:  Wt Readings from Last 1 Encounters:  07/17/15 286 lb (129.729 kg)   KDT:OIZT mass index is 36.7 kg/(m^2).     Last Height:   Ht Readings from Last 1 Encounters:  07/17/15 _1  (1.88 m)    Physical exam:  General: The patient is awake, alert and appears not in acute distress. The patient is well groomed. Head: Normocephalic, atraumatic. Neck is supple. Mallampati 4,  neck circumference:19.5. Nasal airflow unrestricted - rhinitis, TMJ is not evident . Retrognathia is not seen.  Cardiovascular:  Regular rate and rhythm ,  without distended neck veins. The patient has louder heart  beats on his right than on his left, due to his congenitally existing ounce position of the great arteries right ventricle ejection fraction is over 50% left ejection fraction over 50% he is status post closure of a ventriculoseptal defect. Respiratory: Lungs are clear to auscultation. Skin:  With ankle edema, and gout.  Trunk: BMI is elevated. The patient's posture is erect   Neurologic exam : The patient is awake and alert, oriented to place and time.    Cranial nerves: Pupils are equal and briskly reactive to light.  Visual fields by finger perimetry are intact. Hearing to finger rub intact.   Facial sensation intact to fine touch.  Facial motor strength is symmetric and tongue and uvula move midline. Shoulder shrug was symmetrical.     The patient was advised of the nature of the diagnosed sleep disorder , the treatment options and risks for general a health and wellness arising from not treating the condition.  I spent more than 25mnutes of face to face time with the patient. Greater than 50% of time was spent in counseling and coordination of care. We have discussed the diagnosis and differential and I answered the patient's questions.     Assessment:  After physical and neurologic examination, review of laboratory studies,  Personal review of imaging studies, reports of other /same  Imaging studies ,  Results of polysomnography/ neurophysiology testing and pre-existing records as far as provided in visit., my assessment is : Willie Daniel one episode of documented atrial flutter, and has a highly unusual cardiovascular anatomy with his transposition of the great vessels. Palpitations have occurred but has only woke him out of sleep once. The ventriculoseptal defect, dextrocardia congenital pulmonary stenosis all contribute to a higher risk of obstructive sleep apnea in the context of obesity and witnessed  snoring and apneas per spouse.  I will order a couple of the and will especially  he any signs of hypoxemia. Should the patient have enough apnea to be titrated to CPAP we will also heed  special attention to persisting hypersomnia while on CPAP- and may titrate to oxygen. The technologist will receive detailed instructions.   1) OSA-Willie Daniel was diagnosed with a severe form of apnea at an AHI of 89.8 RDI of 90.2 hypoxemia for over 100 minutes in the diagnostic part of the study only. Also, oxygen nadir was 58% Patient with high cardiovascular risk of OSA- Status of transposition of the great arteries.  Recent flutter attack.  He has begun using CPAP at an outer titration with a full face mask his AHI is reduced to 6.9 the apnea index alone is 4.4 with 3 so-called unknown apneas, usually this indicates an error due to high air leaks. He could not tolerate a nasal interface and a full face mask is associated with higher air leaks.  2)  Obesity - this increased/ influenced  His risk for apnea- high grate mallampatti , neck.   3) Stress, at the job. He is the main breadwinner. He feels that he is under constant performance review.     Plan:  Treatment plan and additional workup :  I will ask Willie Daniel to continue using the CPAP with the AutoSet pressures, and a full face mask of his choice.  He is [facility] by [facility] today-  In addition I will order an overnight pulse oximetry while on CPAP to assure that hypoxemia has been sufficiently treated. Rv in 12 month   Asencion Partridge Adler Chartrand MD  07/17/2015

## 2015-07-17 NOTE — Patient Instructions (Signed)

## 2015-07-19 DIAGNOSIS — G4733 Obstructive sleep apnea (adult) (pediatric): Secondary | ICD-10-CM | POA: Diagnosis not present

## 2015-08-05 DIAGNOSIS — Q203 Discordant ventriculoarterial connection: Secondary | ICD-10-CM | POA: Diagnosis not present

## 2015-08-05 DIAGNOSIS — Q240 Dextrocardia: Secondary | ICD-10-CM | POA: Diagnosis not present

## 2015-08-05 DIAGNOSIS — Z7901 Long term (current) use of anticoagulants: Secondary | ICD-10-CM | POA: Diagnosis not present

## 2015-08-05 DIAGNOSIS — Z79899 Other long term (current) drug therapy: Secondary | ICD-10-CM | POA: Diagnosis not present

## 2015-08-05 DIAGNOSIS — I4892 Unspecified atrial flutter: Secondary | ICD-10-CM | POA: Diagnosis not present

## 2015-08-05 DIAGNOSIS — Z8679 Personal history of other diseases of the circulatory system: Secondary | ICD-10-CM | POA: Diagnosis not present

## 2015-08-05 DIAGNOSIS — Q221 Congenital pulmonary valve stenosis: Secondary | ICD-10-CM | POA: Diagnosis not present

## 2015-08-05 DIAGNOSIS — Z9889 Other specified postprocedural states: Secondary | ICD-10-CM | POA: Diagnosis not present

## 2015-08-05 DIAGNOSIS — Z8774 Personal history of (corrected) congenital malformations of heart and circulatory system: Secondary | ICD-10-CM | POA: Diagnosis not present

## 2015-08-05 DIAGNOSIS — G4733 Obstructive sleep apnea (adult) (pediatric): Secondary | ICD-10-CM | POA: Diagnosis not present

## 2015-08-13 DIAGNOSIS — Z1389 Encounter for screening for other disorder: Secondary | ICD-10-CM | POA: Diagnosis not present

## 2015-08-13 DIAGNOSIS — Z6836 Body mass index (BMI) 36.0-36.9, adult: Secondary | ICD-10-CM | POA: Diagnosis not present

## 2015-08-13 DIAGNOSIS — M109 Gout, unspecified: Secondary | ICD-10-CM | POA: Diagnosis not present

## 2015-08-13 DIAGNOSIS — I10 Essential (primary) hypertension: Secondary | ICD-10-CM | POA: Diagnosis not present

## 2015-08-13 DIAGNOSIS — G4733 Obstructive sleep apnea (adult) (pediatric): Secondary | ICD-10-CM | POA: Diagnosis not present

## 2015-08-19 DIAGNOSIS — G4733 Obstructive sleep apnea (adult) (pediatric): Secondary | ICD-10-CM | POA: Diagnosis not present

## 2015-08-22 ENCOUNTER — Other Ambulatory Visit: Payer: Self-pay | Admitting: Physician Assistant

## 2015-08-31 ENCOUNTER — Other Ambulatory Visit: Payer: Self-pay | Admitting: Physician Assistant

## 2015-09-02 NOTE — Telephone Encounter (Signed)
Please refer this question to Dr. Graciela HusbandsKlein, since we are not his following group, I am not certain how he would want to proceed.  Thank you Luster Landsbergenee

## 2015-09-03 ENCOUNTER — Telehealth: Payer: Self-pay | Admitting: Physician Assistant

## 2015-09-03 DIAGNOSIS — Q203 Discordant ventriculoarterial connection: Secondary | ICD-10-CM | POA: Diagnosis not present

## 2015-09-03 DIAGNOSIS — Z8679 Personal history of other diseases of the circulatory system: Secondary | ICD-10-CM | POA: Diagnosis not present

## 2015-09-03 NOTE — Telephone Encounter (Signed)
Brandy,  I spoke with the patient, he is still taking the Eliquis and still following with Dr. Earma ReadingBashore and has seen him since his hospital visit, I called in a 2 week supply for him, and he will continue his refills at Dr. Paul HalfBashore's office.   Thanks [employer]

## 2015-09-03 NOTE — Telephone Encounter (Signed)
Yes, please route to Dr. Earma ReadingBashore since that is his primary doctor/cardiologist and we are not following. Please confirm with the patient that he is still on the medicine and that he is still seeing Dr. Earma ReadingBashore, will need to get his refills through his office.  Ramiro Harvesthan-you, Dorthia Tout

## 2015-09-03 NOTE — Telephone Encounter (Signed)
Received request for Eliquis refills, though we are not the patient's following cardiologists, did initiate during a hospital stay.  I spoke with the patient and he is indeed still following with Dr. Earma ReadingBashore and has seen him since his hospital stay and continues to take Eliquis. He denies any bleeding or signs of bleeding and is feeling well.  He reported that he has made attempts to get the Eliquis refilled at Dr. Paul HalfBashore's office though so far no response, I have authrorized a 2 week supply for the patient, though he is instructed/explained that since we are not following him will be unable to continue to provide refills and this needs to be followed with his attending cardiologist.  He stated understanding, appreciated the help and will get in touch with Dr. Paul HalfBashore's office to change the rx to his office.  Francis Dowseenee Aashvi Rezabek, PA-C

## 2015-09-04 ENCOUNTER — Telehealth: Payer: Self-pay | Admitting: *Deleted

## 2015-09-04 NOTE — Telephone Encounter (Signed)
Willie CrockerSteven L Daniel  09/03/2015  Telephone  MRN:  [id number]   Description: 45 year old male  Provider: Sheilah Pigeonenee Lynn Ursuy, PA-C  [facility]       Call Documentation      Renee Norberto SorensonLynn Ursuy, [place]PA-C at 09/03/2015 12:05 PM     Status: Signed       Expand All Collapse All   Received request for Eliquis refills, though we are not the patient's following cardiologists, did initiate during a hospital stay. I spoke with the patient and he is indeed still following with Dr. Earma ReadingBashore and has seen him since his hospital stay and continues to take Eliquis. He denies any bleeding or signs of bleeding and is feeling well. He reported that he has made attempts to get the Eliquis refilled at Dr. Paul HalfBashore's office though so far no response, I have authrorized a 2 week supply for the patient, though he is instructed/explained that since we are not following him will be unable to continue to provide refills and this needs to be followed with his attending cardiologist. He stated understanding, appreciated the help and will get in touch with Dr. Paul HalfBashore's office to change the rx to his office.  Francis Dowseenee Ursuy, PA-C

## 2015-09-05 DIAGNOSIS — H5213 Myopia, bilateral: Secondary | ICD-10-CM | POA: Diagnosis not present

## 2015-09-12 ENCOUNTER — Other Ambulatory Visit: Payer: Self-pay | Admitting: Physician Assistant

## 2015-09-12 NOTE — Telephone Encounter (Signed)
Yes, please forward to Dr. Paul HalfBashore's office, I had spoken to the patient when the Eliquis Rx request came through and he will be managing his medicines there. Please remind the patient.  Thanks! Renee

## 2015-09-18 DIAGNOSIS — G4733 Obstructive sleep apnea (adult) (pediatric): Secondary | ICD-10-CM | POA: Diagnosis not present

## 2015-11-14 DIAGNOSIS — G4733 Obstructive sleep apnea (adult) (pediatric): Secondary | ICD-10-CM | POA: Diagnosis not present

## 2015-11-17 DIAGNOSIS — G4733 Obstructive sleep apnea (adult) (pediatric): Secondary | ICD-10-CM | POA: Diagnosis not present

## 2015-11-25 DIAGNOSIS — Z1389 Encounter for screening for other disorder: Secondary | ICD-10-CM | POA: Diagnosis not present

## 2015-11-25 DIAGNOSIS — M1000 Idiopathic gout, unspecified site: Secondary | ICD-10-CM | POA: Diagnosis not present

## 2015-11-25 DIAGNOSIS — E6609 Other obesity due to excess calories: Secondary | ICD-10-CM | POA: Diagnosis not present

## 2015-11-25 DIAGNOSIS — Z6836 Body mass index (BMI) 36.0-36.9, adult: Secondary | ICD-10-CM | POA: Diagnosis not present

## 2016-01-27 ENCOUNTER — Other Ambulatory Visit: Payer: Self-pay | Admitting: Physician Assistant

## 2016-02-19 DIAGNOSIS — Q210 Ventricular septal defect: Secondary | ICD-10-CM | POA: Diagnosis not present

## 2016-02-19 DIAGNOSIS — R002 Palpitations: Secondary | ICD-10-CM | POA: Diagnosis not present

## 2016-02-19 DIAGNOSIS — Z8679 Personal history of other diseases of the circulatory system: Secondary | ICD-10-CM | POA: Diagnosis not present

## 2016-02-19 DIAGNOSIS — G4737 Central sleep apnea in conditions classified elsewhere: Secondary | ICD-10-CM | POA: Diagnosis not present

## 2016-02-19 DIAGNOSIS — Q203 Discordant ventriculoarterial connection: Secondary | ICD-10-CM | POA: Diagnosis not present

## 2016-02-19 DIAGNOSIS — Q221 Congenital pulmonary valve stenosis: Secondary | ICD-10-CM | POA: Diagnosis not present

## 2016-02-19 DIAGNOSIS — Q240 Dextrocardia: Secondary | ICD-10-CM | POA: Diagnosis not present

## 2016-03-30 DIAGNOSIS — R002 Palpitations: Secondary | ICD-10-CM | POA: Diagnosis not present

## 2016-03-31 DIAGNOSIS — Q203 Discordant ventriculoarterial connection: Secondary | ICD-10-CM | POA: Diagnosis not present

## 2016-03-31 DIAGNOSIS — Q240 Dextrocardia: Secondary | ICD-10-CM | POA: Diagnosis not present

## 2016-03-31 DIAGNOSIS — I484 Atypical atrial flutter: Secondary | ICD-10-CM | POA: Diagnosis not present

## 2016-05-21 DIAGNOSIS — M109 Gout, unspecified: Secondary | ICD-10-CM | POA: Diagnosis not present

## 2016-05-21 DIAGNOSIS — G4733 Obstructive sleep apnea (adult) (pediatric): Secondary | ICD-10-CM | POA: Diagnosis not present

## 2016-06-01 DIAGNOSIS — Q203 Discordant ventriculoarterial connection: Secondary | ICD-10-CM | POA: Diagnosis not present

## 2016-06-01 DIAGNOSIS — Q221 Congenital pulmonary valve stenosis: Secondary | ICD-10-CM | POA: Diagnosis not present

## 2016-06-01 DIAGNOSIS — Q210 Ventricular septal defect: Secondary | ICD-10-CM | POA: Diagnosis not present

## 2016-07-14 ENCOUNTER — Encounter: Payer: Self-pay | Admitting: Neurology

## 2016-07-15 ENCOUNTER — Encounter: Payer: Self-pay | Admitting: Neurology

## 2016-07-15 ENCOUNTER — Encounter (INDEPENDENT_AMBULATORY_CARE_PROVIDER_SITE_OTHER): Payer: Self-pay

## 2016-07-15 ENCOUNTER — Ambulatory Visit (INDEPENDENT_AMBULATORY_CARE_PROVIDER_SITE_OTHER): Payer: BLUE CROSS/BLUE SHIELD | Admitting: Neurology

## 2016-07-15 VITALS — BP 122/76 | HR 71 | Ht 74.0 in | Wt 275.0 lb

## 2016-07-15 DIAGNOSIS — G4734 Idiopathic sleep related nonobstructive alveolar hypoventilation: Secondary | ICD-10-CM

## 2016-07-15 DIAGNOSIS — Z9989 Dependence on other enabling machines and devices: Secondary | ICD-10-CM | POA: Diagnosis not present

## 2016-07-15 DIAGNOSIS — Q203 Discordant ventriculoarterial connection: Secondary | ICD-10-CM

## 2016-07-15 DIAGNOSIS — G4733 Obstructive sleep apnea (adult) (pediatric): Secondary | ICD-10-CM

## 2016-07-15 NOTE — Addendum Note (Signed)
Addended by: Melvyn NovasHMEIER, Fabianna Keats on: 07/15/2016 11:29 AM   Modules accepted: Orders

## 2016-07-15 NOTE — Progress Notes (Signed)
SLEEP MEDICINE CLINIC   Provider:  Larey Seat, M D  Referring Provider: No ref. provider found MD at [facility] Physician:    Chief Complaint  Patient presents with  . Follow-up    CPAP going well    HPI:  Willie Daniel is a 46 y.o. male , seen here as a revisit  from  [facility] cardiology, [place] .  I had the pleasure of seeing Willie Daniel today on 07/15/2016, he has been a compliant CPAP user for the last 28 out of 30 days and 87% compliance and an average user time of 5 hours and 12 minutes. His residual AHI is 3.0, which also is an excellent result all residual apneas are obstructive in nature. His 95th percentile pressure is 12.4 cm water he does have occasionally high air leaks. He is using an AutoSet between 5 and 20 cm water with 2 cm expiratory pressure relief. Based on current numbers and the patient's report of low daytime sleepiness reflected in an Epworth sleepiness score of 3 points fatigue severity score of 24 points, then needs to be no adjustments made. I will follow him once a year for CPAP compliance. I will send a letter to his [place] cardiologist. There is no local primary care physician to be informed. He has seen [facility] in [place].   Interval history from 07/17/2015, Willie Daniel is here today to follow-up on his recent sleep study dated 05/19/2015. He had indeed a shocking AHI of 89.8 and an RDI of 90.2. There was no supine exenteration or REM accentuation noted. He did not retain CO2. He had a total of 104 minutes of oxygen desaturation with a nadir of 58%. Based on these results the study was split for a CPAP titration. Pressures between 5 and 15 cm water were explored and the 15 cm water pressure achieved an AHI of 0.0. The patient developed some periodic limb movements but the arousals related to these were only 1.3 per hour. He was placed on an AutoPap between 5 and 20 cm water with a quadro fullface mask medium size. I'm able today to  review his outer titration. The patient is compliant has used the machine for 27 out of 30 days and for 25 days over 4 hours. Total daily user time is 5-1/2 hours, residual AHI is 6.9 all residual apneas are obstructive. 91st percentile pressure is 11.3 he does have some significant air leaks. He has quite significant nasal congestion and is forced to mouth breathe which probably contributes to the airleak. The apnea index however at home has been reduced from a peak at around April 16 and the first 10 days of May show a significant reduction in AHI. He has tightened his face gear around May 1 and this may be reflected and lower AHI now, and  lower air leaks.  He reported FSS at 30, Epworth 5 . Chief complaint according to patient : " My wife stated I snore" , and "my sleep is less restoring and I get fatigued.  " Willie Daniel reports that he has a regular work hours works often long hours, and therefore has not been able to establish a routine sleep time. 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 His systemic ventricle has been enlarged over the last 6 months and he has been woken by  a rapid heart rate and palpitations was diagnosed with atrial flutter when he presented to [facility]. He was treated with Bonney Roussel was, diltiazem and underwent a DC CV under the guidance of Dr. Virl Axe, cardio- electro-physiologist. He remains on Naprosyn as needed, indomethacin with meals, has also hydrocodone for pain ordered as a when necessary medication, Coreg, Lotensin and the above-mentioned and appears. He resides near [facility] and the sleep study was more convenient to be performed here in [place] than at [place], [place]. He works as a Financial controller. Sleep medical history and family sleep history:  Parents are both deceased. Father when he  was 77 , and his mother of multiple myeloma about 6 years ago. Social history: ETOH- on week ends,  beer about 4-6. Caffeine - occasional Soda once or twice a week-, not coffee, rarely tea.  No tobaco use.    Review of Systems: Out of a complete 14 system review, the patient complains of only the following symptoms, and all other reviewed systems are negative. Snoring, dry mouth.The patient has louder heart beats on his right than on his left, due to his congenitally existing trans position of the great arteries right ventricle ejection fraction is over 50% left ejection fraction over 50% he is status post closure of a ventriculoseptal defect. Facial erythema.    Epworth score 3, Fatigue severity score  24  , depression score 0. N/a    Social History   Social History  . Marital status: Married    Spouse name: [place] "Maudie Mercury"  . Number of children: 2  . Years of education: 14   Occupational History  . [employer]     Social History Main Topics  . Smoking status: Never Smoker  . Smokeless tobacco: Never Used  . Alcohol use 0.0 oz/week     Comment: quit 12 pack per day, 4-5 times a week  . Drug use: No  . Sexual activity: Not on file   Other Topics Concern  . Not on file   Social History Narrative   Lives at home with wife and kids   Caffeine use: Tea/soda occass (2-3 per week)    Family History  Problem Relation Age of Onset  . Multiple myeloma Mother     Past Medical History:  Diagnosis Date  . Hypertension   . OSA on CPAP 07/17/2015    Past Surgical History:  Procedure Laterality Date  . CARDIAC SURGERY  1976   age 15 at [place]  . ELECTROPHYSIOLOGIC STUDY N/A 04/14/2015   Procedure: Cardioversion;  Surgeon: Deboraha Sprang, MD;  Location: [place] [facility];  Service: Cardiovascular;  Laterality: N/A;  . I&D EXTREMITY  2004   right leg infection  . KNEE ARTHROSCOPY WITH MEDIAL MENISECTOMY Left 12/14/2012   Procedure: KNEE ARTHROSCOPY WITH PARTIAL MEDIAL AND  LATERAL MENISECTOMY EXTENSIVE SYNOVECTOMY REMOVAL NODULE LEFT KNEE;  Surgeon: Ninetta Lights, MD;  Location: [facility];  Service: Orthopedics;  Laterality: Left;  . TEE WITHOUT CARDIOVERSION N/A 04/14/2015   Procedure: TRANSESOPHAGEAL ECHOCARDIOGRAM (TEE);  Surgeon: Josue Hector, MD;  Location: [place];  Service: Cardiovascular;  Laterality: N/A;  . VASECTOMY  2010   general anesth    Current Outpatient Prescriptions  Medication Sig Dispense Refill  . benazepril (LOTENSIN) 10 MG tablet Take 1 tablet (10 mg total) by mouth at bedtime. 30 tablet 3  . furosemide (LASIX) 20 MG tablet Take 20 mg by mouth daily.    . indomethacin (  INDOCIN) 50 MG capsule Take 50 mg by mouth daily as needed for mild pain or moderate pain.   3  . metoprolol succinate (TOPROL-XL) 50 MG 24 hr tablet Take 50 mg by mouth daily.  0   No current facility-administered medications for this visit.     Allergies as of 07/15/2016  . (No Known Allergies)    Vitals: BP 122/76   Pulse 71   Ht '6\' 2"'  (1.88 m)   Wt 275 lb (124.7 kg)   BMI 35.31 kg/m  Last Weight:  Wt Readings from Last 1 Encounters:  07/15/16 275 lb (124.7 kg)   XBJ:YNWG mass index is 35.31 kg/m.     Last Height:   Ht Readings from Last 1 Encounters:  07/15/16 '6\' 2"'  (1.88 m)    Physical exam:  General: The patient is awake, alert and appears not in acute distress. The patient is well groomed. Head: Normocephalic, atraumatic. Neck is supple. Mallampati 4,  neck circumference:19.5. Nasal airflow unrestricted - rhinitis, TMJ is not evident . Retrognathia is not seen.  Cranial nerves, intact smell and taste , normal facial strength and sensation, no visual field changes - he is now using corrective lenses.   Cardiovascular:  Regular rate and rhythm ,  without distended neck veins.  The patient has louder heart beats on his right than on his left, due to his congenitally existing trans position of the great arteries right  ventricle ejection fraction is over 50% left ejection fraction over 50% he is status post closure of a ventriculoseptal defect. Respiratory: Lungs are clear to auscultation. Skin:  With ankle edema, and gout.  Trunk: BMI is elevated. The patient's posture is erect   Neurologic exam : The patient is awake and alert, oriented to place and time.    Cranial nerves: Pupils are equal and briskly reactive to light.  Visual fields by finger perimetry are intact. Hearing to finger rub intact.   Facial sensation intact to fine touch.  Facial motor strength is symmetric and tongue and uvula move midline. Shoulder shrug was symmetrical.     The patient was advised of the nature of the diagnosed sleep disorder , the treatment options and risks for general a health and wellness arising from not treating the condition.  I spent more than 54mnutes of face to face time with the patient. Greater than 50% of time was spent in counseling and coordination of care. We have discussed the diagnosis and differential and I answered the patient's questions.     Assessment:  After physical and neurologic examination, review of laboratory studies,  Personal review of imaging studies, reports of other /same  Imaging studies ,  Results of polysomnography/ neurophysiology testing and pre-existing records as far as provided in visit., my assessment is :  Mr. FRoughtonhad one episode of documented atrial flutter, and has a highly unusual cardiovascular anatomy with  transposition of the great vessels.  Palpitations have occurred but has only woke him out of sleep once.  The ventriculoseptal defect, dextrocardia congenital pulmonary stenosis all contribute to a higher risk of obstructive sleep apnea in the context of obesity and witnessed snoring and apneas per spouse.   1) OSA-Mr. FKisswas diagnosed with a severe form of apnea at an AHI of 89.8 RDI of 90.2 hypoxemia for over 100 minutes in the diagnostic part of the study  only. Also, oxygen nadir was 58% Patient with high cardiovascular risk of OSA- Status of transposition of the great arteries.  Recent flutter attack.  He has begun using CPAP at an outer titration with a full face mask his AHI is reduced to 3.0 with FFM.   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   Plan:  Treatment plan and additional workup :  I will ask Mr. Palmieri to continue using the CPAP with the AutoSet pressures, and a full face . Rv in 12 month   Asencion Partridge Dianne Whelchel MD  07/15/2016

## 2016-09-06 DIAGNOSIS — Z1389 Encounter for screening for other disorder: Secondary | ICD-10-CM | POA: Diagnosis not present

## 2016-09-06 DIAGNOSIS — I10 Essential (primary) hypertension: Secondary | ICD-10-CM | POA: Diagnosis not present

## 2016-09-06 DIAGNOSIS — Z6836 Body mass index (BMI) 36.0-36.9, adult: Secondary | ICD-10-CM | POA: Diagnosis not present

## 2016-09-06 DIAGNOSIS — M109 Gout, unspecified: Secondary | ICD-10-CM | POA: Diagnosis not present

## 2016-11-05 DIAGNOSIS — G4733 Obstructive sleep apnea (adult) (pediatric): Secondary | ICD-10-CM | POA: Diagnosis not present

## 2017-03-15 DIAGNOSIS — I484 Atypical atrial flutter: Secondary | ICD-10-CM | POA: Diagnosis not present

## 2017-03-15 DIAGNOSIS — R4586 Emotional lability: Secondary | ICD-10-CM | POA: Diagnosis not present

## 2017-03-15 DIAGNOSIS — Q240 Dextrocardia: Secondary | ICD-10-CM | POA: Diagnosis not present

## 2017-03-15 DIAGNOSIS — Q210 Ventricular septal defect: Secondary | ICD-10-CM | POA: Diagnosis not present

## 2017-03-15 DIAGNOSIS — Q203 Discordant ventriculoarterial connection: Secondary | ICD-10-CM | POA: Diagnosis not present

## 2017-05-11 DIAGNOSIS — Z6837 Body mass index (BMI) 37.0-37.9, adult: Secondary | ICD-10-CM | POA: Diagnosis not present

## 2017-05-11 DIAGNOSIS — Z1389 Encounter for screening for other disorder: Secondary | ICD-10-CM | POA: Diagnosis not present

## 2017-05-11 DIAGNOSIS — R05 Cough: Secondary | ICD-10-CM | POA: Diagnosis not present

## 2017-05-25 DIAGNOSIS — G4733 Obstructive sleep apnea (adult) (pediatric): Secondary | ICD-10-CM | POA: Diagnosis not present

## 2017-05-26 DIAGNOSIS — I4892 Unspecified atrial flutter: Secondary | ICD-10-CM | POA: Diagnosis not present

## 2017-05-26 DIAGNOSIS — I5022 Chronic systolic (congestive) heart failure: Secondary | ICD-10-CM | POA: Diagnosis not present

## 2017-05-26 DIAGNOSIS — N179 Acute kidney failure, unspecified: Secondary | ICD-10-CM | POA: Diagnosis not present

## 2017-05-26 DIAGNOSIS — Z8774 Personal history of (corrected) congenital malformations of heart and circulatory system: Secondary | ICD-10-CM | POA: Diagnosis not present

## 2017-05-26 DIAGNOSIS — R002 Palpitations: Secondary | ICD-10-CM | POA: Diagnosis not present

## 2017-05-26 DIAGNOSIS — Z8679 Personal history of other diseases of the circulatory system: Secondary | ICD-10-CM | POA: Diagnosis not present

## 2017-05-26 DIAGNOSIS — E875 Hyperkalemia: Secondary | ICD-10-CM | POA: Diagnosis not present

## 2017-05-26 DIAGNOSIS — I110 Hypertensive heart disease with heart failure: Secondary | ICD-10-CM | POA: Diagnosis not present

## 2017-05-26 DIAGNOSIS — G4733 Obstructive sleep apnea (adult) (pediatric): Secondary | ICD-10-CM | POA: Diagnosis not present

## 2017-05-26 DIAGNOSIS — I484 Atypical atrial flutter: Secondary | ICD-10-CM | POA: Diagnosis not present

## 2017-05-26 DIAGNOSIS — Z7901 Long term (current) use of anticoagulants: Secondary | ICD-10-CM | POA: Diagnosis not present

## 2017-05-26 DIAGNOSIS — I5023 Acute on chronic systolic (congestive) heart failure: Secondary | ICD-10-CM | POA: Diagnosis not present

## 2017-05-26 DIAGNOSIS — N170 Acute kidney failure with tubular necrosis: Secondary | ICD-10-CM | POA: Diagnosis not present

## 2017-05-26 DIAGNOSIS — I471 Supraventricular tachycardia: Secondary | ICD-10-CM | POA: Insufficient documentation

## 2017-05-26 DIAGNOSIS — Z9114 Patient's other noncompliance with medication regimen: Secondary | ICD-10-CM | POA: Diagnosis not present

## 2017-05-26 DIAGNOSIS — I4581 Long QT syndrome: Secondary | ICD-10-CM | POA: Diagnosis not present

## 2017-05-26 DIAGNOSIS — I4719 Other supraventricular tachycardia: Secondary | ICD-10-CM | POA: Insufficient documentation

## 2017-05-26 DIAGNOSIS — Z79899 Other long term (current) drug therapy: Secondary | ICD-10-CM | POA: Diagnosis not present

## 2017-05-26 DIAGNOSIS — Q203 Discordant ventriculoarterial connection: Secondary | ICD-10-CM | POA: Diagnosis not present

## 2017-05-27 DIAGNOSIS — I471 Supraventricular tachycardia: Secondary | ICD-10-CM | POA: Diagnosis not present

## 2017-05-27 DIAGNOSIS — Q203 Discordant ventriculoarterial connection: Secondary | ICD-10-CM | POA: Diagnosis not present

## 2017-05-27 DIAGNOSIS — N179 Acute kidney failure, unspecified: Secondary | ICD-10-CM | POA: Diagnosis not present

## 2017-05-27 DIAGNOSIS — I5022 Chronic systolic (congestive) heart failure: Secondary | ICD-10-CM | POA: Diagnosis not present

## 2017-05-28 DIAGNOSIS — I5022 Chronic systolic (congestive) heart failure: Secondary | ICD-10-CM | POA: Diagnosis not present

## 2017-05-28 DIAGNOSIS — N179 Acute kidney failure, unspecified: Secondary | ICD-10-CM | POA: Diagnosis not present

## 2017-05-28 DIAGNOSIS — Q203 Discordant ventriculoarterial connection: Secondary | ICD-10-CM | POA: Diagnosis not present

## 2017-05-28 DIAGNOSIS — I471 Supraventricular tachycardia: Secondary | ICD-10-CM | POA: Diagnosis not present

## 2017-05-29 DIAGNOSIS — I471 Supraventricular tachycardia: Secondary | ICD-10-CM | POA: Diagnosis not present

## 2017-05-29 DIAGNOSIS — Q203 Discordant ventriculoarterial connection: Secondary | ICD-10-CM | POA: Diagnosis not present

## 2017-05-29 DIAGNOSIS — N179 Acute kidney failure, unspecified: Secondary | ICD-10-CM | POA: Diagnosis not present

## 2017-05-29 DIAGNOSIS — I5022 Chronic systolic (congestive) heart failure: Secondary | ICD-10-CM | POA: Diagnosis not present

## 2017-07-19 ENCOUNTER — Encounter: Payer: Self-pay | Admitting: Neurology

## 2017-07-21 ENCOUNTER — Ambulatory Visit: Payer: BLUE CROSS/BLUE SHIELD | Admitting: Neurology

## 2017-07-21 ENCOUNTER — Encounter: Payer: Self-pay | Admitting: Neurology

## 2017-07-21 DIAGNOSIS — G4733 Obstructive sleep apnea (adult) (pediatric): Secondary | ICD-10-CM

## 2017-07-21 DIAGNOSIS — I4892 Unspecified atrial flutter: Secondary | ICD-10-CM | POA: Diagnosis not present

## 2017-07-21 DIAGNOSIS — Z9989 Dependence on other enabling machines and devices: Secondary | ICD-10-CM | POA: Diagnosis not present

## 2017-07-21 DIAGNOSIS — Z9289 Personal history of other medical treatment: Secondary | ICD-10-CM

## 2017-07-21 DIAGNOSIS — Z9889 Other specified postprocedural states: Secondary | ICD-10-CM | POA: Diagnosis not present

## 2017-07-21 NOTE — Progress Notes (Signed)
SLEEP MEDICINE CLINIC   Provider:  Larey Seat, M D  Referring Provider: Jamse Arn,  MD at [place] Physician:  Dr. Hilma Favors at [facility]     HPI: 07-21-2017  Willie Daniel is a 47 y.o. male , seen here as a revisit  from [facility] cardiology, Margretta Sidle,  Willie Daniel has undergone cardioversion for atrial flutter on 05-26-2017, under the guidance of Dr. Michaelle Birks. 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  He is a compliance CPAP user, and on a FFM, which sometimes bothers him.  Willie Daniel has recovered well from the cardioversion, his current rhythm is sinus. His medications include Lotensin, Lasix, indomethacin, Toprol 50 mg extended release form daily, dofetilide 250 mcg 2 times daily and baby aspirin 1 time a day. Mr. Stonehocker is using a ResMed machine air sense 10 AutoSet function between 5 and 20 cmH2O 2 cm expiratory pressure relief, the 95th percentile pressure at night is 13.4 cmH2O it leaves him with a residual AHI of 3.2 most of the apneas that are still found out obstructive in nature, yet he has definitely enough blood pressure window to compensate.  The average use of time is 5 hours 93% compliance by days and 23 complaint percent compliance by over 4-hour compliance rules.  He sometimes falls asleep and forgets to put the CPAP on first, and he had to use a different machine from his own while being hospitalized at [facility].    I had the pleasure of seeing [name] today on 07/15/2016, he has been a compliant CPAP user for the last 28 out of 30 days and 87% compliance and an average user time of 5 hours and 12 minutes. His residual AHI is 3.0, which also is an excellent result all residual apneas are obstructive in nature. His 95th percentile pressure is 12.4 cm water he does have occasionally high air leaks. He is using an AutoSet between 5 and 20 cm water with 2 cm expiratory pressure relief. Based on current numbers and the patient's  report of low daytime sleepiness reflected in an Epworth sleepiness score of 3 points fatigue severity score of 24 points, then needs to be no adjustments made. I will follow him once a year for CPAP compliance. I will send a letter to his [place] cardiologist. There is no local primary care physician to be informed. He has seen [facility] in [place].   Interval history from 07/17/2015, Willie Daniel is here today to follow-up on his recent sleep study dated 05/19/2015. He had indeed a shocking AHI of 89.8 and an RDI of 90.2. There was no supine exenteration or REM accentuation noted. He did not retain CO2. He had a total of 104 minutes of oxygen desaturation with a nadir of 58%. Based on these results the study was split for a CPAP titration. Pressures between 5 and 15 cm water were explored and the 15 cm water pressure achieved an AHI of 0.0. The patient developed some periodic limb movements but the arousals related to these were only 1.3 per hour. He was placed on an AutoPap between 5 and 20 cm water with a quadro fullface mask medium size. I'm able today to review his outer titration. The patient is compliant has used the machine for 27 out of 30 days and for 25 days over 4 hours. Total daily user time is 5-1/2 hours, residual AHI is 6.9 all residual apneas are obstructive. 91st percentile pressure is  11.3 he does have some significant air leaks. He has quite significant nasal congestion and is forced to mouth breathe which probably contributes to the airleak. The apnea index however at home has been reduced from a peak at around April 16 and the first 10 days of May show a significant reduction in AHI. He has tightened his face gear around May 1 and this may be reflected and lower AHI now, and  lower air leaks.  He reported FSS at 30, Epworth 5 . Chief complaint according to patient : " My wife stated I snore" , and "my sleep is less restoring and I get fatigued.  " Willie Daniel reports that he has a  regular work hours works often long hours, and therefore has not been able to establish a routine sleep time. 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 His systemic ventricle has been enlarged over the last 6 months and he has been woken by a rapid heart rate and palpitations was diagnosed with atrial flutter when he presented to [facility]. He was treated with Bonney Roussel was, diltiazem and underwent a DC CV under the guidance of Dr. Virl Axe, cardio- electro-physiologist. He remains on Naprosyn as needed, indomethacin with meals, has also hydrocodone for pain ordered as a when necessary medication, Coreg, Lotensin and the above-mentioned and appears. He resides near [facility] and the sleep study was more convenient to be performed here in [place] than at [place], [place]. He works as a Financial controller. Sleep medical history and family sleep history:  Parents are both deceased. Father when he was 48 , and his mother of multiple myeloma about 6 years ago. Social history: ETOH- on week ends,  beer about 4-6. Caffeine - occasional Soda once or twice a week-, not coffee, rarely tea.  No tobaco use.    Review of Systems: Out of a complete 14 system review, the patient complains of only the following symptoms, and all other reviewed systems are negative. Snoring, dry mouth.The patient has louder heart beats on his right than on his left, due to his congenitally existing trans position of the great arteries right ventricle ejection fraction is over 50% left ejection fraction over 50% he is status post closure of a ventriculoseptal defect. Facial erythema.    Epworth score 3, Fatigue severity score  24  , depression score 0. N/a    Social History   Socioeconomic History  . Marital status: Married    Spouse name: [place] "Maudie Mercury"    . Number of children: 2  . Years of education: 15  . Highest education level: Not on file  Occupational History  . Occupation: Electrical engineer   Social Needs  . Financial resource strain: Not on file  . Food insecurity:    Worry: Not on file    Inability: Not on file  . Transportation needs:    Medical: Not on file    Non-medical: Not on file  Tobacco Use  . Smoking status: Never Smoker  . Smokeless tobacco: Never Used  Substance and Sexual Activity  . Alcohol use: Yes    Alcohol/week: 0.0 oz    Comment: quit 12 pack per day, 4-5 times a week  . Drug use: No  . Sexual activity: Not on file  Lifestyle  . Physical activity:    Days per week: Not  on file    Minutes per session: Not on file  . Stress: Not on file  Relationships  . Social connections:    Talks on phone: Not on file    Gets together: Not on file    Attends religious service: Not on file    Active member of club or organization: Not on file    Attends meetings of clubs or organizations: Not on file    Relationship status: Not on file  . Intimate partner violence:    Fear of current or ex partner: Not on file    Emotionally abused: Not on file    Physically abused: Not on file    Forced sexual activity: Not on file  Other Topics Concern  . Not on file  Social History Narrative   Lives at home with wife and kids   Caffeine use: Tea/soda occass (2-3 per week)    Family History  Problem Relation Age of Onset  . Multiple myeloma Mother     Past Medical History:  Diagnosis Date  . Hypertension   . OSA on CPAP 07/17/2015    Past Surgical History:  Procedure Laterality Date  . CARDIAC SURGERY  1976   age 50 at [place]  . ELECTROPHYSIOLOGIC STUDY N/A 04/14/2015   Procedure: Cardioversion;  Surgeon: Deboraha Sprang, MD;  Location: [place] [facility];  Service: Cardiovascular;  Laterality: N/A;  . I&D EXTREMITY  2004   right leg infection  . KNEE ARTHROSCOPY WITH MEDIAL MENISECTOMY Left 12/14/2012    Procedure: KNEE ARTHROSCOPY WITH PARTIAL MEDIAL AND LATERAL MENISECTOMY EXTENSIVE SYNOVECTOMY REMOVAL NODULE LEFT KNEE;  Surgeon: Ninetta Lights, MD;  Location: [facility];  Service: Orthopedics;  Laterality: Left;  . TEE WITHOUT CARDIOVERSION N/A 04/14/2015   Procedure: TRANSESOPHAGEAL ECHOCARDIOGRAM (TEE);  Surgeon: Josue Hector, MD;  Location: [facility];  Service: Cardiovascular;  Laterality: N/A;  . VASECTOMY  2010   general anesth    Current Outpatient Medications  Medication Sig Dispense Refill  . benazepril (LOTENSIN) 10 MG tablet Take 1 tablet (10 mg total) by mouth at bedtime. 30 tablet 3  . furosemide (LASIX) 20 MG tablet Take 20 mg by mouth daily.    . indomethacin (INDOCIN) 50 MG capsule Take 50 mg by mouth daily as needed for mild pain or moderate pain.   3  . metoprolol succinate (TOPROL-XL) 50 MG 24 hr tablet Take 50 mg by mouth daily.  0   No current facility-administered medications for this visit.     Allergies as of 07/21/2017  . (No Known Allergies)    Vitals: There were no vitals taken for this visit. Last Weight:  Wt Readings from Last 1 Encounters:  07/15/16 275 lb (124.7 kg)   OEH:OZYYQ is no height or weight on file to calculate BMI.     Last Height:   Ht Readings from Last 1 Encounters:  07/15/16 _0  (1.88 m)    Physical exam:  General: The patient is awake, alert and appears not in acute distress. The patient is well groomed. Head: Normocephalic, atraumatic. Neck is supple. Mallampati 4,  neck circumference:19.5. Nasal airflow unrestricted - rhinitis, TMJ is not evident . Retrognathia is not seen.  Cranial nerves, intact smell and taste , normal facial strength and sensation, no visual field changes - he is now using corrective lenses.   Cardiovascular:  Regular rate and rhythm ,  without distended neck veins.  The patient has louder heart beats on his right  than on his left, due to his congenitally existing trans position of the  great arteries right ventricle ejection fraction is over 50% left ejection fraction over 50% he is status post closure of a ventriculoseptal defect. Respiratory: Lungs are clear to auscultation. Skin:  With ankle edema, and gout.  Trunk: BMI is elevated. The patient's posture is erect   Neurologic exam : The patient is awake and alert, oriented to place and time.    Cranial nerves: Pupils are equal and briskly reactive to light.  Visual fields by finger perimetry are intact. Hearing to finger rub intact.   Facial sensation intact to fine touch.  Facial motor strength is symmetric and tongue and uvula move midline. Shoulder shrug was symmetrical.     The patient was advised of the nature of the diagnosed sleep disorder , the treatment options and risks for general a health and wellness arising from not treating the condition.  I spent more than 47mnutes of face to face time with the patient. Greater than 50% of time was spent in counseling and coordination of care. We have discussed the diagnosis and differential and I answered the patient's questions.     Assessment:  After physical and neurologic examination, review of laboratory studies,  Personal review of imaging studies, reports of other /same  Imaging studies ,  Results of polysomnography/ neurophysiology testing and pre-existing records as far as provided in visit., my assessment is :  Mr. FCuberohad one more episode of documented atrial flutter, and has a highly unusual cardiovascular anatomy with transposition of the great vessels.  Palpitations have occurred but has only woke him out of sleep once. The last one, 05-26-2017- produced palpitations at 9 PM and lasted 16 hours, he consulted his cardiologist by phone- and was asked to come to ED, [facility]  [facility], and had a cardioversion. Medication rate control failed.   The ventriculoseptal defect, dextrocardia congenital pulmonary stenosis all contribute to a  higher risk of obstructive sleep apnea in the context of obesity and witnessed snoring and apneas per spouse.   1) OSA-Mr. FKlemzwas diagnosed with a severe form of apnea at an AHI of 89.8/h,  RDI of 90.2/h, and  hypoxemia for over 100 minutes in the diagnostic part of the study only. Also, oxygen nadir was 58% Patient with high cardiovascular risk of OSA- Status of transposition of the great arteries.  Compliant CPAP user. Again flutter attack.  He has begun using CPAP auto titration with a full face mask his AHI is reduced to 3.0 with FFM.  5-20 cm water . I will reduce to 16 cm water, and  2 cm EPR. Offer a refitting for less bulky type of a mask.   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   Plan:  Treatment plan and additional workup :  I will ask Mr. Hollin to continue using the CPAP with the AutoSet pressures, and a asked for a refitting of a  full face .  Rv in 12 month   CAsencion PartridgeDohmeier MD  07/21/2017

## 2017-07-21 NOTE — Patient Instructions (Signed)
Atrial Flutter °Atrial flutter is a type of abnormal heart rhythm (arrhythmia). In atrial flutter, the heartbeat is fast but regular. There are two types of atrial flutter: °· Paroxysmal atrial flutter. This type starts suddenly. It usually stops on its own soon after it starts. °· Permanent atrial flutter. This type does not go away. ° °What are the causes? °This condition may be caused by: °· A heart condition or problem, such as: °? A heart attack. °? Heart failure. °? A heart valve problem. °· A lung problem, such as: °? A blood clot in the lungs (pulmonary embolism, or PE). °? Chronic obstructive pulmonary disease. °· Poorly controlled high blood pressure (hypertension). °· Hyperthyroidism. °· Caffeine. °· Some decongestant cold medicines. °· Low levels of minerals called electrolytes in the blood. °· Cocaine. ° °What increases the risk? °This condition is more likely to develop in: °· Elderly adults. °· Men. ° °What are the signs or symptoms? °Symptoms of this condition include: °· A feeling that your heart is pounding or racing (palpitations). °· Shortness of breath. °· Chest pain. °· Feeling light-headed. °· Dizziness. °· Fainting. ° °How is this diagnosed? °This condition may be diagnosed with tests, including: °· An electrocardiogram (ECG). This is a painless test that records electrical signals in the heart. °· Holter monitoring. For this test, you wear a device that records your heartbeat for 1-2 days. °· Cardiac event monitoring. For this test, you wear a device that records your heartbeat for up to 30 days. °· An echocardiogram. This is a painless test that uses sound waves to make a picture of your heart. °· Stress test. This test records your heartbeat while you exercise. °· Blood tests. ° °How is this treated? °This condition may be treated with: °· Treatment of any underlying conditions. °· Medicine to make your heart beat more slowly. °· Medicine to keep the condition from coming back. °· A  procedure to keep the condition under control. Some procedures to do this include: °? Cardioversion. During this procedure, medicines or an electrical shock are given to make the heart beat normally. °? Ablation. During this procedure, the heart tissue that is causing the problem is destroyed. This procedure may be done if atrial flutter lasts a long time or happens often. ° °Follow these instructions at home: °· Take over-the-counter and prescription medicines only as told by your health care provider. °· Do not take any new medicines without talking to your health care provider. °· Do not use tobacco products, including cigarettes, chewing tobacco, or e-cigarettes. If you need help quitting, ask your health care provider. °· Limit alcohol intake to no more than 1 drink per day for nonpregnant women and 2 drinks per day for men. One drink equals 12 oz of beer, 5 oz of wine, or 1½ oz of hard liquor. °· Try to reduce any stress. Stress can make your symptoms worse. °Contact a health care provider if: °· Your symptoms get worse. °Get help right away if: °· You are dizzy. °· You feel like fainting or you faint. °· You have shortness of breath. °· You feel pain or pressure in your chest. °· You suddenly feel nauseous or you suddenly vomit. °· There is a sudden change in your ability to speak, eat, or move. °· You are sweating a lot for no reason. °This information is not intended to replace advice given to you by your health care provider. Make sure you discuss any questions you have with your health care   provider. °Document Released: 07/11/2008 Document Revised: 07/02/2015 Document Reviewed: 09/06/2014 °Elsevier Interactive Patient Education © 2018 [employer] ° °

## 2017-08-31 DIAGNOSIS — Q203 Discordant ventriculoarterial connection: Secondary | ICD-10-CM | POA: Diagnosis not present

## 2017-08-31 DIAGNOSIS — Q221 Congenital pulmonary valve stenosis: Secondary | ICD-10-CM | POA: Diagnosis not present

## 2017-08-31 DIAGNOSIS — I4892 Unspecified atrial flutter: Secondary | ICD-10-CM | POA: Diagnosis not present

## 2017-08-31 DIAGNOSIS — Q210 Ventricular septal defect: Secondary | ICD-10-CM | POA: Diagnosis not present

## 2017-10-25 ENCOUNTER — Other Ambulatory Visit (HOSPITAL_COMMUNITY): Payer: Self-pay | Admitting: Family Medicine

## 2017-10-25 ENCOUNTER — Ambulatory Visit (HOSPITAL_COMMUNITY)
Admission: RE | Admit: 2017-10-25 | Discharge: 2017-10-25 | Disposition: A | Discharge status: Home / Self Care | Payer: BLUE CROSS/BLUE SHIELD | Source: Ambulatory Visit | Attending: Family Medicine | Admitting: Family Medicine

## 2017-10-25 DIAGNOSIS — E6609 Other obesity due to excess calories: Secondary | ICD-10-CM | POA: Diagnosis not present

## 2017-10-25 DIAGNOSIS — I370 Nonrheumatic pulmonary valve stenosis: Secondary | ICD-10-CM | POA: Diagnosis not present

## 2017-10-25 DIAGNOSIS — Q205 Discordant atrioventricular connection: Secondary | ICD-10-CM | POA: Diagnosis not present

## 2017-10-25 DIAGNOSIS — R05 Cough: Secondary | ICD-10-CM | POA: Diagnosis not present

## 2017-10-25 DIAGNOSIS — I10 Essential (primary) hypertension: Secondary | ICD-10-CM | POA: Insufficient documentation

## 2017-10-25 DIAGNOSIS — Q240 Dextrocardia: Secondary | ICD-10-CM | POA: Diagnosis not present

## 2017-10-25 DIAGNOSIS — T464X5A Adverse effect of angiotensin-converting-enzyme inhibitors, initial encounter: Secondary | ICD-10-CM

## 2017-10-25 DIAGNOSIS — Z6834 Body mass index (BMI) 34.0-34.9, adult: Secondary | ICD-10-CM | POA: Diagnosis not present

## 2017-10-25 DIAGNOSIS — Z1389 Encounter for screening for other disorder: Secondary | ICD-10-CM | POA: Diagnosis not present

## 2017-10-25 DIAGNOSIS — I4892 Unspecified atrial flutter: Secondary | ICD-10-CM | POA: Diagnosis not present

## 2017-10-25 DIAGNOSIS — Z0000 Encounter for general adult medical examination without abnormal findings: Secondary | ICD-10-CM | POA: Diagnosis not present

## 2017-10-25 DIAGNOSIS — Q210 Ventricular septal defect: Secondary | ICD-10-CM | POA: Diagnosis not present

## 2017-12-29 DIAGNOSIS — M109 Gout, unspecified: Secondary | ICD-10-CM | POA: Diagnosis not present

## 2018-01-05 IMAGING — CR DG CHEST 1V PORT
1 series · 1 of 1 positions shown · non-contrast
Comparison: 02/05/2015

CLINICAL DATA: 45-year-old male with tachycardia and chest pain
since this morning. Initial encounter.

EXAM:
PORTABLE CHEST 1 VIEW

[AP]
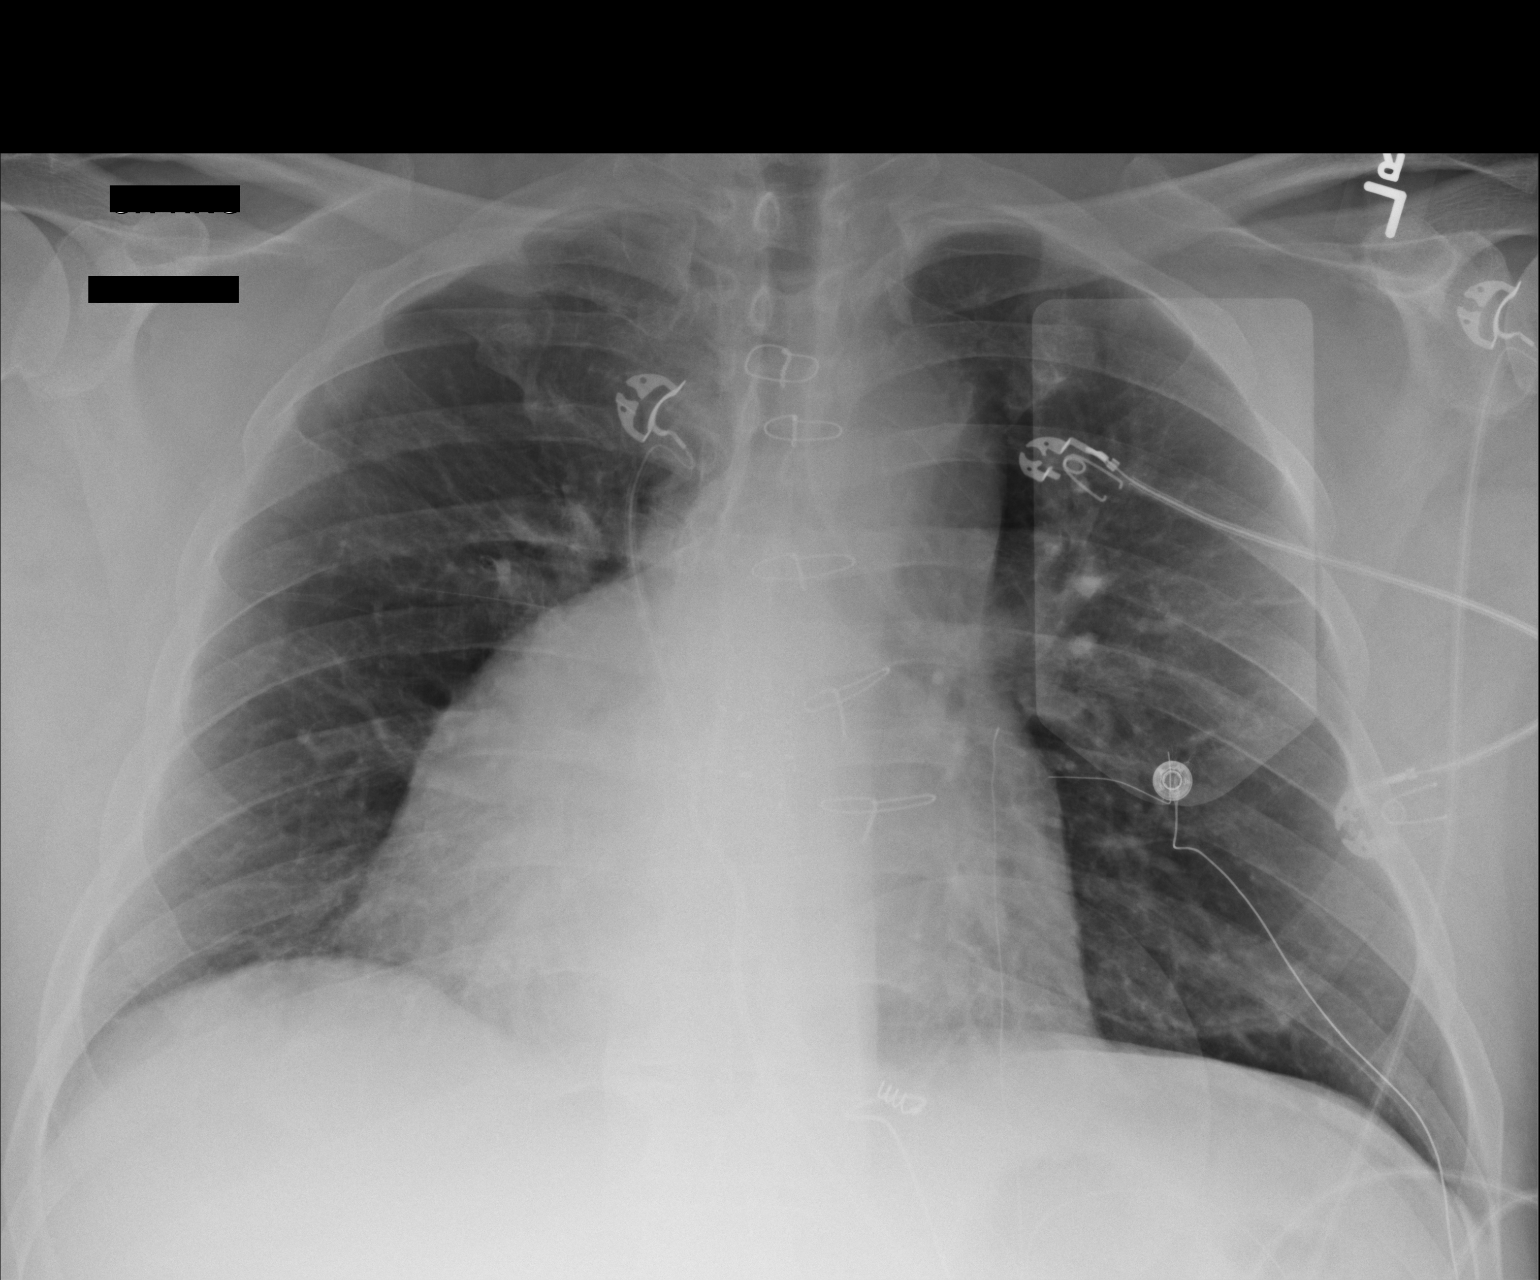

[1 of 1 positions shown; findings below may reference images not displayed]

FINDINGS: Portable AP upright view at 1125 hours. Sequelae of median
sternotomy. Persistent epicardial type pacemaker wire again projects
over the left cardiophrenic angle. As before, the majority of the
cardiac silhouette is to the right of midline, but the aortic arch
and gastric position appear to be orthotopic. Stable cardiac size
and mediastinal contours. Visualized tracheal air column is within
normal limits. No pneumothorax, pulmonary edema, pleural effusion or
confluent pulmonary opacity.
IMPRESSION: 1.  No acute cardiopulmonary abnormality.
2. Previous heart surgery, perhaps related to congenital heart
disease in light of the atypical cardiac silhouette. Retained
epicardial pacer wire and sequelae of sternotomy.

## 2018-01-09 DIAGNOSIS — I483 Typical atrial flutter: Secondary | ICD-10-CM | POA: Diagnosis not present

## 2018-01-09 DIAGNOSIS — Q203 Discordant ventriculoarterial connection: Secondary | ICD-10-CM | POA: Diagnosis not present

## 2018-01-09 DIAGNOSIS — Q221 Congenital pulmonary valve stenosis: Secondary | ICD-10-CM | POA: Diagnosis not present

## 2018-01-09 DIAGNOSIS — M1000 Idiopathic gout, unspecified site: Secondary | ICD-10-CM | POA: Diagnosis not present

## 2018-01-09 DIAGNOSIS — Q210 Ventricular septal defect: Secondary | ICD-10-CM | POA: Diagnosis not present

## 2018-03-24 DIAGNOSIS — Q210 Ventricular septal defect: Secondary | ICD-10-CM | POA: Diagnosis not present

## 2018-03-24 DIAGNOSIS — Q221 Congenital pulmonary valve stenosis: Secondary | ICD-10-CM | POA: Diagnosis not present

## 2018-03-24 DIAGNOSIS — I4892 Unspecified atrial flutter: Secondary | ICD-10-CM | POA: Diagnosis not present

## 2018-03-24 DIAGNOSIS — Q203 Discordant ventriculoarterial connection: Secondary | ICD-10-CM | POA: Diagnosis not present

## 2018-03-24 DIAGNOSIS — R0609 Other forms of dyspnea: Secondary | ICD-10-CM | POA: Diagnosis not present

## 2018-03-28 DIAGNOSIS — G4733 Obstructive sleep apnea (adult) (pediatric): Secondary | ICD-10-CM | POA: Diagnosis not present

## 2018-03-29 DIAGNOSIS — Q203 Discordant ventriculoarterial connection: Secondary | ICD-10-CM | POA: Diagnosis not present

## 2018-03-29 DIAGNOSIS — I483 Typical atrial flutter: Secondary | ICD-10-CM | POA: Diagnosis not present

## 2018-03-29 DIAGNOSIS — E8770 Fluid overload, unspecified: Secondary | ICD-10-CM | POA: Diagnosis not present

## 2018-03-29 DIAGNOSIS — Q240 Dextrocardia: Secondary | ICD-10-CM | POA: Diagnosis not present

## 2018-03-29 DIAGNOSIS — Q221 Congenital pulmonary valve stenosis: Secondary | ICD-10-CM | POA: Diagnosis not present

## 2018-05-05 DIAGNOSIS — Q221 Congenital pulmonary valve stenosis: Secondary | ICD-10-CM | POA: Diagnosis not present

## 2018-05-05 DIAGNOSIS — Q240 Dextrocardia: Secondary | ICD-10-CM | POA: Diagnosis not present

## 2018-05-05 DIAGNOSIS — I4892 Unspecified atrial flutter: Secondary | ICD-10-CM | POA: Diagnosis not present

## 2018-05-05 DIAGNOSIS — I272 Pulmonary hypertension, unspecified: Secondary | ICD-10-CM | POA: Diagnosis not present

## 2018-05-05 DIAGNOSIS — Z8774 Personal history of (corrected) congenital malformations of heart and circulatory system: Secondary | ICD-10-CM | POA: Diagnosis not present

## 2018-05-05 DIAGNOSIS — I5022 Chronic systolic (congestive) heart failure: Secondary | ICD-10-CM | POA: Diagnosis not present

## 2018-05-09 DIAGNOSIS — I471 Supraventricular tachycardia: Secondary | ICD-10-CM | POA: Diagnosis not present

## 2018-05-09 DIAGNOSIS — Q221 Congenital pulmonary valve stenosis: Secondary | ICD-10-CM | POA: Diagnosis not present

## 2018-05-09 DIAGNOSIS — Q203 Discordant ventriculoarterial connection: Secondary | ICD-10-CM | POA: Diagnosis not present

## 2018-05-09 DIAGNOSIS — Z7682 Awaiting organ transplant status: Secondary | ICD-10-CM | POA: Diagnosis not present

## 2018-05-09 DIAGNOSIS — Q240 Dextrocardia: Secondary | ICD-10-CM | POA: Diagnosis not present

## 2018-05-09 DIAGNOSIS — G4730 Sleep apnea, unspecified: Secondary | ICD-10-CM | POA: Diagnosis not present

## 2018-05-09 DIAGNOSIS — I5042 Chronic combined systolic (congestive) and diastolic (congestive) heart failure: Secondary | ICD-10-CM | POA: Diagnosis not present

## 2018-05-09 DIAGNOSIS — Z48812 Encounter for surgical aftercare following surgery on the circulatory system: Secondary | ICD-10-CM | POA: Diagnosis not present

## 2018-05-09 DIAGNOSIS — Q205 Discordant atrioventricular connection: Secondary | ICD-10-CM | POA: Diagnosis not present

## 2018-05-09 DIAGNOSIS — I509 Heart failure, unspecified: Secondary | ICD-10-CM | POA: Diagnosis not present

## 2018-05-09 DIAGNOSIS — Q210 Ventricular septal defect: Secondary | ICD-10-CM | POA: Diagnosis not present

## 2018-05-18 DIAGNOSIS — M10041 Idiopathic gout, right hand: Secondary | ICD-10-CM | POA: Diagnosis not present

## 2018-07-05 DIAGNOSIS — M2550 Pain in unspecified joint: Secondary | ICD-10-CM | POA: Diagnosis not present

## 2018-07-05 DIAGNOSIS — R5382 Chronic fatigue, unspecified: Secondary | ICD-10-CM | POA: Diagnosis not present

## 2018-07-12 DIAGNOSIS — M2550 Pain in unspecified joint: Secondary | ICD-10-CM | POA: Diagnosis not present

## 2018-07-12 DIAGNOSIS — Z6833 Body mass index (BMI) 33.0-33.9, adult: Secondary | ICD-10-CM | POA: Diagnosis not present

## 2018-07-12 DIAGNOSIS — M1A09X0 Idiopathic chronic gout, multiple sites, without tophus (tophi): Secondary | ICD-10-CM | POA: Diagnosis not present

## 2018-07-26 ENCOUNTER — Encounter: Payer: Self-pay | Admitting: Neurology

## 2018-07-26 ENCOUNTER — Telehealth: Payer: Self-pay | Admitting: Neurology

## 2018-07-26 NOTE — Telephone Encounter (Signed)
Called the patient to inform them that our office has placed new protocols in place for our office visits. Due to Covid 19 our office is reducing our number of office visits in order to minimize the risk to our patients and healthcare providers.Our office is now providing the capability to offer the patients virtual visits at this time. Informed of what that process looks like and informed that the Virtual visit will still be billed through insurance as such. Due to [place],informed the patient since the appointment is taking place over the phone/internet app, we can't guarantee the security of the phone line. With that said if we do move forward I would have to get verbal consent to complete the Video Visit/Phone call. Patient gave verbal consent to move forward with the video visit. I have reviewed the patient's chart and made sure that everything is up to date. Patient is also made aware that since this is a video visit we are able to complete the visit but a physical exam is not able to be done since the patient is not present in person. Pt request the link be texted/emailed to them. Pt informed that the front staff will contact the patient aprox 30 minutes prior to the scheduled appointment to "check them in" and make sure that everything is ready for the appointment to get started. Pt verbalized understanding of this information and will states to be ready for the visit at least 15-30 min prior to the visit. Reminded the patient once more that this is treated as a Office visit and the patient must be prepared for the visit and ready at the time of their appointment preferably in a well lit area where they have good connection for the visit. Pt verbalized understanding.  texted the link to his cell.

## 2018-07-27 ENCOUNTER — Encounter: Payer: Self-pay | Admitting: Neurology

## 2018-07-27 ENCOUNTER — Ambulatory Visit (INDEPENDENT_AMBULATORY_CARE_PROVIDER_SITE_OTHER): Payer: 59 | Admitting: Neurology

## 2018-07-27 ENCOUNTER — Other Ambulatory Visit: Payer: Self-pay

## 2018-07-27 DIAGNOSIS — I4892 Unspecified atrial flutter: Secondary | ICD-10-CM | POA: Diagnosis not present

## 2018-07-27 DIAGNOSIS — G4733 Obstructive sleep apnea (adult) (pediatric): Secondary | ICD-10-CM | POA: Diagnosis not present

## 2018-07-27 DIAGNOSIS — Z9989 Dependence on other enabling machines and devices: Secondary | ICD-10-CM | POA: Diagnosis not present

## 2018-07-27 NOTE — Patient Instructions (Signed)
He currently uses a minimum pressure of 6 a maximum pressure window of 16 cmH2O and 2 cm EPR.  His obstructive apnea index is 3.0, his central apnea index 0.1 but he has acquired high air leakage.  95th percentile air leak is 28 L/min and a full facemask user.  He has 100% compliance for days 90% compliance for time did average use at time of 5 hours and 53 minutes.  Pressure 95th percentile is 13.4 centimeter water.  It became clear during our video conference that the patient has acquired a full beard and this is the cause of his air leak.  The air leakage however does not wake him up but leaves him with a dry mouth in the morning.  He reports no excessive daytime sleepiness and endorsed today the Epworth sleepiness score at 1 out of 24 possible points.  He is sleeping well he also was able to lose some weight and his current body weight is 260 pounds which will further support his apnea treatment.  He denies fatigue and he has no significant  interval medical history.   Observations/Objective: continue current settings- RV virtually or face to face with NP or MD  in 12 month.

## 2018-07-27 NOTE — Progress Notes (Signed)
SLEEP MEDICINE CLINIC   Provider:  Larey Seat, M D  Referring Provider: Jamse Arn,  MD at [facility] Physician:  Dr. Hilma Favors at [facility] Visit via Video Note  I connected with Ferol Daniel on 07/27/18 at  9:30 AM EDT by a video enabled telemedicine application and verified that I am speaking with the correct person using two identifiers.  Location: Patient: at office;  Provider: at [facility]    I discussed the limitations of evaluation and management by telemedicine and the availability of in person appointments. The patient expressed understanding and agreed to proceed.  History of Present Illness: 07-27-2018; Mr. Willie Daniel has been an experienced user of a auto titrating CPAP.  During his last visit with [name] the settings were changed.     HPI: 07-21-2017  Willie Daniel is a 48 y.o. male , seen here as a revisit  from [facility] cardiology, Willie Daniel,  Willie Daniel has undergone cardioversion for atrial flutter on 05-26-2017, under the guidance of Dr. Michaelle Birks. 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  He is a compliance CPAP user, and on a FFM, which sometimes bothers him.  Willie Daniel has recovered well from the cardioversion, his current rhythm is sinus. His medications include Lotensin, Lasix, indomethacin, Toprol 50 mg extended release form daily, dofetilide 250 mcg 2 times daily and baby aspirin 1 time a day. [name] is using a ResMed machine air sense 10 AutoSet function between 5 and 20 cmH2O 2 cm expiratory pressure relief, the 95th percentile pressure at night is 13.4 cmH2O it leaves him with a residual AHI of 3.2 most of the apneas that are still found out obstructive in nature, yet he has definitely enough blood pressure window to compensate.  The average use of time is 5 hours 93% compliance by days and 23 complaint percent compliance by over 4-hour compliance rules.  He sometimes falls asleep and forgets to put the  CPAP on first, and he had to use a different machine from his own while being hospitalized at [facility].    I had the pleasure of seeing Mr. [name] today on 07/15/2016, he has been a compliant CPAP user for the last 28 out of 30 days and 87% compliance and an average user time of 5 hours and 12 minutes. His residual AHI is 3.0, which also is an excellent result all residual apneas are obstructive in nature. His 95th percentile pressure is 12.4 cm water he does have occasionally high air leaks. He is using an AutoSet between 5 and 20 cm water with 2 cm expiratory pressure relief. Based on current numbers and the patient's report of low daytime sleepiness reflected in an Epworth sleepiness score of 3 points fatigue severity score of 24 points, then needs to be no adjustments made. I will follow him once a year for CPAP compliance. I will send a letter to his [place] cardiologist. There is no local primary care physician to be informed. He has seen [facility] in [place].   Interval history from 07/17/2015, Willie Daniel is here today to follow-up on his recent sleep study dated 05/19/2015. He had indeed a shocking AHI of 89.8 and an RDI of 90.2. There was no supine exenteration or REM accentuation noted. He did not retain CO2. He had a total of 104 minutes of oxygen desaturation with a nadir of 58%. Based on these results the study was split for a CPAP  titration. Pressures between 5 and 15 cm water were explored and the 15 cm water pressure achieved an AHI of 0.0. The patient developed some periodic limb movements but the arousals related to these were only 1.3 per hour. He was placed on an AutoPap between 5 and 20 cm water with a quadro fullface mask medium size. I'm able today to review his outer titration. The patient is compliant has used the machine for 27 out of 30 days and for 25 days over 4 hours. Total daily user time is 5-1/2 hours, residual AHI is 6.9 all residual apneas are obstructive. 91st percentile  pressure is 11.3 he does have some significant air leaks. He has quite significant nasal congestion and is forced to mouth breathe which probably contributes to the airleak. The apnea index however at home has been reduced from a peak at around April 16 and the first 10 days of May show a significant reduction in AHI. He has tightened his face gear around May 1 and this may be reflected and lower AHI now, and  lower air leaks.  He reported FSS at 30, Epworth 5 . Chief complaint according to patient : " My wife stated I snore" , and "my sleep is less restoring and I get fatigued.  " Mr. Range reports that he has a regular work hours works often long hours, and therefore has not been able to establish a routine sleep time. 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 His systemic ventricle has been enlarged over the last 6 months and he has been woken by a rapid heart rate and palpitations was diagnosed with atrial flutter when he presented to [facility]. He was treated with Bonney Roussel was, diltiazem and underwent a DC CV under the guidance of Dr. Virl Axe, cardio- electro-physiologist. He remains on Naprosyn as needed, indomethacin with meals, has also hydrocodone for pain ordered as a when necessary medication, Coreg, Lotensin and the above-mentioned and appears. He resides near [facility] and the sleep study was more convenient to be performed here in [place] than at [place], [place]. He works as a Financial controller. Sleep medical history and family sleep history:  Parents are both deceased. Father when he was 26 , and his mother of multiple myeloma about 6 years ago. Social history: ETOH- on week ends,  beer about 4-6. Caffeine - occasional Soda once or twice a week-, not coffee, rarely tea.  No tobaco use.    Review of Systems: Out  of a complete 14 system review, the patient complains of only the following symptoms, and all other reviewed systems are negative. Snoring, dry mouth.The patient has louder heart beats on his right than on his left, due to his congenitally existing trans position of the great arteries right ventricle ejection fraction is over 50% left ejection fraction over 50% he is status post closure of a ventriculoseptal defect. Facial erythema.    Epworth score 1  Fatigue severity score  24  , depression score 0. N/a    Social History   Socioeconomic History  . Marital status: Married    Spouse name: [place] "Maudie Mercury"  . Number of children: 2  . Years of education: 54  . Highest education level: Not on file  Occupational History  . Occupation: Electrical engineer   Social Needs  . Financial resource strain: Not  on file  . Food insecurity:    Worry: Not on file    Inability: Not on file  . Transportation needs:    Medical: Not on file    Non-medical: Not on file  Tobacco Use  . Smoking status: Never Smoker  . Smokeless tobacco: Never Used  Substance and Sexual Activity  . Alcohol use: Not Currently    Alcohol/week: 0.0 standard drinks  . Drug use: No  . Sexual activity: Not on file  Lifestyle  . Physical activity:    Days per week: Not on file    Minutes per session: Not on file  . Stress: Not on file  Relationships  . Social connections:    Talks on phone: Not on file    Gets together: Not on file    Attends religious service: Not on file    Active member of club or organization: Not on file    Attends meetings of clubs or organizations: Not on file    Relationship status: Not on file  . Intimate partner violence:    Fear of current or ex partner: Not on file    Emotionally abused: Not on file    Physically abused: Not on file    Forced sexual activity: Not on file  Other Topics Concern  . Not on file  Social History Narrative   Lives at home with wife and kids   Caffeine use:  Tea/soda occass (2-3 per week)    Family History  Problem Relation Age of Onset  . Multiple myeloma Mother     Past Medical History:  Diagnosis Date  . Hypertension   . OSA on CPAP 07/17/2015    Past Surgical History:  Procedure Laterality Date  . CARDIAC SURGERY  1976   age 45 at [place]  . ELECTROPHYSIOLOGIC STUDY N/A 04/14/2015   Procedure: Cardioversion;  Surgeon: Deboraha Sprang, MD;  Location: [facility];  Service: Cardiovascular;  Laterality: N/A;  . I&D EXTREMITY  2004   right leg infection  . KNEE ARTHROSCOPY WITH MEDIAL MENISECTOMY Left 12/14/2012   Procedure: KNEE ARTHROSCOPY WITH PARTIAL MEDIAL AND LATERAL MENISECTOMY EXTENSIVE SYNOVECTOMY REMOVAL NODULE LEFT KNEE;  Surgeon: Ninetta Lights, MD;  Location: [facility];  Service: Orthopedics;  Laterality: Left;  . TEE WITHOUT CARDIOVERSION N/A 04/14/2015   Procedure: TRANSESOPHAGEAL ECHOCARDIOGRAM (TEE);  Surgeon: Josue Hector, MD;  Location: [facility];  Service: Cardiovascular;  Laterality: N/A;  . VASECTOMY  2010   general anesth    Current Outpatient Medications  Medication Sig Dispense Refill  . allopurinol (ZYLOPRIM) 100 MG tablet Take 100 mg by mouth daily.     Marland Kitchen apixaban (ELIQUIS) 5 MG TABS tablet Take by mouth 2 (two) times daily.     Marland Kitchen aspirin EC 81 MG tablet Take by mouth.    . Colchicine (MITIGARE) 0.6 MG CAPS Take 1 capsule by mouth daily.    Marland Kitchen dofetilide (TIKOSYN) 250 MCG capsule Take 250 mcg by mouth 2 (two) times daily.    . metoprolol succinate (TOPROL-XL) 50 MG 24 hr tablet Take 50 mg by mouth daily.  0  . OLMESARTAN MEDOXOMIL PO Take 20 mg by mouth daily.    . potassium chloride SA (K-DUR) 20 MEQ tablet Take by mouth as needed.     No current facility-administered medications for this visit.     Allergies as of 07/27/2018  . (No Known Allergies)    Vitals: There were no vitals taken for this  visit. Last Weight:  Wt Readings from Last 1 Encounters:  07/15/16 275 lb (124.7  kg)   AJH:HIDUP is no height or weight on file to calculate BMI.     Last Height:   Ht Readings from Last 1 Encounters:  07/15/16 '6\' 2"'  (1.88 m)    Physical exam:  General: The patient is awake, alert and appears not in acute distress. The patient is well groomed. Head: Normocephalic, atraumatic. Neck is supple. Mallampati 4,  neck circumference:19.5. Nasal airflow unrestricted - rhinitis, TMJ is not evident . Retrognathia is not seen.  Cranial nerves, intact smell and taste , normal facial strength - he is now using corrective lenses.    Trunk: BMI is elevated. The patient's posture is erect   Neurologic exam : The patient is awake and alert, oriented to place and time.    Cranial nerves: Pupils are equal  Facial motor strength is symmetric and tongue and uvula move midline. Shoulder shrug was symmetrical.      He currently uses a minimum pressure of 6 a maximum pressure window of 16 cmH2O and 2 cm EPR.  His obstructive apnea index is 3.0, his central apnea index 0.1 but he has acquired high air leakage.  95th percentile air leak is 28 L/min and a full facemask user.  He has 100% compliance for days 90% compliance for time did average use at time of 5 hours and 53 minutes.  Pressure 95th percentile is 13.4 centimeter water.  It became clear during our video conference that the patient has acquired a full beard and this is the cause of his air leak.  The air leakage however does not wake him up but leaves him with a dry mouth in the morning.  He reports no excessive daytime sleepiness and endorsed today the Epworth sleepiness score at 1 out of 24 possible points.  He is sleeping well he also was able to lose some weight and his current body weight is 260 pounds which will further support his apnea treatment.  He denies fatigue and he has no significant  interval medical history.    Observations/Objective: continue current settings- RV virtually or face to face in 12 month.      I  discussed the assessment and treatment plan with the patient. The patient was provided an opportunity to ask questions and all were answered. The patient agreed with the plan and demonstrated an understanding of the instructions.   The patient was advised to call back or seek an in-person evaluation if the symptoms worsen or if the condition fails to improve as anticipated.  I provided 12 minutes of non-face-to-face time during this encounter.   Larey Seat, MD   Asencion Partridge Avanell Banwart MD  07/27/2018

## 2019-01-29 DIAGNOSIS — I5022 Chronic systolic (congestive) heart failure: Secondary | ICD-10-CM | POA: Insufficient documentation

## 2019-07-24 ENCOUNTER — Ambulatory Visit: Payer: 59 | Admitting: Adult Health

## 2019-07-24 ENCOUNTER — Other Ambulatory Visit: Payer: Self-pay

## 2019-07-24 ENCOUNTER — Encounter: Payer: Self-pay | Admitting: Adult Health

## 2019-07-24 VITALS — BP 110/71 | HR 62 | Ht 74.0 in | Wt 284.0 lb

## 2019-07-24 DIAGNOSIS — Z9989 Dependence on other enabling machines and devices: Secondary | ICD-10-CM | POA: Diagnosis not present

## 2019-07-24 DIAGNOSIS — G4733 Obstructive sleep apnea (adult) (pediatric): Secondary | ICD-10-CM | POA: Diagnosis not present

## 2019-07-24 NOTE — Progress Notes (Signed)
PATIENT: Willie Daniel DOB: Aug 27, 1970  REASON FOR VISIT: follow up HISTORY FROM: patient  HISTORY OF PRESENT ILLNESS: Today 07/24/19:  Willie Daniel is a 49 year old male with a history of obstructive sleep apnea on CPAP.  He returns today for follow-up.  His download indicates that he use his machine 28 out of 30 days for compliance of 93%.  He uses machine greater than 4 hours 25 days for compliance of 83%.  On average he uses his machine 6 hours and 10 minutes.  His residual AHI is 2.8 on 6 to 16 cm of water with EPR of 2.  Leak in the 95th percentile is 19 L/min.  He reports that the CPAP is working well for him.  He is followed by [facility] cardiology reports that most likely he will have to have a transplant.  He returns today for an evaluation.  HISTORY I had the pleasure of seeing Willie Daniel today on 07/15/2016, he has been a compliant CPAP user for the last 28 out of 30 days and 87% compliance and an average user time of 5 hours and 12 minutes. His residual AHI is 3.0, which also is an excellent result all residual apneas are obstructive in nature. His 95th percentile pressure is 12.4 cm water he does have occasionally high air leaks. He is using an AutoSet between 5 and 20 cm water with 2 cm expiratory pressure relief. Based on current numbers and the patient's report of low daytime sleepiness reflected in an Epworth sleepiness score of 3 points fatigue severity score of 24 points, then needs to be no adjustments made. I will follow him once a year for CPAP compliance. I will send a letter to his [place] cardiologist. There is no local primary care physician to be informed. He has seen [facility] in [place].    REVIEW OF SYSTEMS: Out of a complete 14 system review of symptoms, the patient complains only of the following symptoms, and all other reviewed systems are negative.  FSS 55 ESS 7  ALLERGIES: No Known Allergies  HOME MEDICATIONS: Outpatient Medications Prior to Visit    Medication Sig Dispense Refill  . allopurinol (ZYLOPRIM) 100 MG tablet Take 100 mg by mouth daily.     Marland Kitchen apixaban (ELIQUIS) 5 MG TABS tablet Take by mouth 2 (two) times daily.     Marland Kitchen aspirin EC 81 MG tablet Take by mouth.    . Colchicine (MITIGARE) 0.6 MG CAPS Take 1 capsule by mouth daily.    Marland Kitchen dofetilide (TIKOSYN) 250 MCG capsule Take 250 mcg by mouth 2 (two) times daily.    . metoprolol succinate (TOPROL-XL) 50 MG 24 hr tablet Take 50 mg by mouth daily.  0  . OLMESARTAN MEDOXOMIL PO Take 20 mg by mouth daily.    . potassium chloride SA (K-DUR) 20 MEQ tablet Take by mouth as needed.     No facility-administered medications prior to visit.    PAST MEDICAL HISTORY: Past Medical History:  Diagnosis Date  . Hypertension   . OSA on CPAP 07/17/2015    PAST SURGICAL HISTORY: Past Surgical History:  Procedure Laterality Date  . CARDIAC SURGERY  1976   age 62 at [place]  . ELECTROPHYSIOLOGIC STUDY N/A 04/14/2015   Procedure: Cardioversion;  Surgeon: Deboraha Sprang, MD;  Location: [facility];  Service: Cardiovascular;  Laterality: N/A;  . I & D EXTREMITY  2004   right leg infection  . KNEE ARTHROSCOPY WITH MEDIAL MENISECTOMY Left 12/14/2012  Procedure: KNEE ARTHROSCOPY WITH PARTIAL MEDIAL AND LATERAL MENISECTOMY EXTENSIVE SYNOVECTOMY REMOVAL NODULE LEFT KNEE;  Surgeon: Ninetta Lights, MD;  Location: [facility];  Service: Orthopedics;  Laterality: Left;  . TEE WITHOUT CARDIOVERSION N/A 04/14/2015   Procedure: TRANSESOPHAGEAL ECHOCARDIOGRAM (TEE);  Surgeon: Josue Hector, MD;  Location[facility];  Service: Cardiovascular;  Laterality: N/A;  . VASECTOMY  2010   general anesth    FAMILY HISTORY: Family History  Problem Relation Age of Onset  . Multiple myeloma Mother     SOCIAL HISTORY: Social History   Socioeconomic History  . Marital status: Married    Spouse name: [place] "Maudie Mercury"  . Number of children: 2  . Years of education: 15  . Highest education  level: Not on file  Occupational History  . Occupation: [employer]   Tobacco Use  . Smoking status: Never Smoker  . Smokeless tobacco: Never Used  Substance and Sexual Activity  . Alcohol use: Not Currently    Alcohol/week: 0.0 standard drinks  . Drug use: No  . Sexual activity: Not on file  Other Topics Concern  . Not on file  Social History Narrative   Lives at home with wife and kids   Caffeine use: Tea/soda occass (2-3 per week)   Social Determinants of Health   Financial Resource Strain:   . Difficulty of Paying Living Expenses:   Food Insecurity:   . Worried About Charity fundraiser in the Last Year:   . Arboriculturist in the Last Year:   Transportation Needs:   . Film/video editor (Medical):   Marland Kitchen Lack of Transportation (Non-Medical):   Physical Activity:   . Days of Exercise per Week:   . Minutes of Exercise per Session:   Stress:   . Feeling of Stress :   Social Connections:   . Frequency of Communication with Friends and Family:   . Frequency of Social Gatherings with Friends and Family:   . Attends Religious Services:   . Active Member of Clubs or Organizations:   . Attends Archivist Meetings:   Marland Kitchen Marital Status:   Intimate Partner Violence:   . Fear of Current or Ex-Partner:   . Emotionally Abused:   Marland Kitchen Physically Abused:   . Sexually Abused:       PHYSICAL EXAM  Vitals:   07/24/19 1325  BP: 110/71  Pulse: 62  Weight: 284 lb (128.8 kg)  Height: '6\' 2"'  (1.88 m)   Body mass index is 36.46 kg/m.  Generalized: Well developed, in no acute distress  Chest: Lungs clear to auscultation bilaterally  Neurological examination  Mentation: Alert oriented to time, place, history taking. Follows all commands speech and language fluent Cranial nerve II-XII: Extraocular movements were full, visual field were full on confrontational test Head turning and shoulder shrug  were normal and symmetric. Motor: The motor testing reveals 5 over  5 strength of all 4 extremities. Good symmetric motor tone is noted throughout.  Sensory: Sensory testing is intact to soft touch on all 4 extremities. No evidence of extinction is noted.  Gait and station: Gait is normal.    DIAGNOSTIC DATA (LABS, IMAGING, TESTING) - I reviewed patient records, labs, notes, testing and imaging myself where available.  Lab Results  Component Value Date   WBC 7.8 04/10/2015   HGB 14.9 04/10/2015   HCT 45.8 04/10/2015   MCV 95.4 04/10/2015   PLT 267 04/10/2015      Component Value  Date/Time   NA 143 04/11/2015 0600   K 4.6 04/11/2015 0600   CL 107 04/11/2015 0600   CO2 28 04/11/2015 0600   GLUCOSE 113 (H) 04/11/2015 0600   BUN 10 04/11/2015 0600   CREATININE 1.22 04/11/2015 0600   CALCIUM 9.1 04/11/2015 0600   GFRNONAA >60 04/11/2015 0600   GFRAA >60 04/11/2015 0600   Lab Results  Component Value Date   CHOL 137 04/11/2015   HDL 26 (L) 04/11/2015   LDLCALC 72 04/11/2015   TRIG 194 (H) 04/11/2015   CHOLHDL 5.3 04/11/2015   No results found for: HGBA1C No results found for: VITAMINB12 Lab Results  Component Value Date   TSH 2.471 04/10/2015      ASSESSMENT AND PLAN 49 y.o. year old male  has a past medical history of Hypertension and OSA on CPAP (07/17/2015). here with:  1. OSA on CPAP  - CPAP compliance excellent - Good treatment of AHI  - Encourage patient to use CPAP nightly and > 4 hours each night - F/U in 1 year or sooner if needed   I spent 20 minutes of face-to-face and non-face-to-face time with patient.  This included previsit chart review, lab review, study review, order entry, electronic health record documentation, patient education.  Ward Givens, MSN, NP-C 07/24/2019, 1:37 PM [facility] [address] [place], [place] [address] [phone]

## 2019-07-24 NOTE — Patient Instructions (Signed)
Continue using CPAP nightly and greater than 4 hours each night °If your symptoms worsen or you develop new symptoms please let us know.  ° °

## 2019-08-01 ENCOUNTER — Ambulatory Visit: Payer: Self-pay | Admitting: Adult Health

## 2019-09-19 ENCOUNTER — Telehealth: Payer: Self-pay

## 2019-09-19 NOTE — Telephone Encounter (Signed)
Voicemail message, 3:41p:  He is trying to get CPAP supplies order but needs an order sent to [facility].

## 2019-09-24 NOTE — Telephone Encounter (Signed)
LVM  Asking pt to let us know what he needs orders for

## 2019-09-25 NOTE — Telephone Encounter (Signed)
Attempted to call pt, LVM  

## 2020-03-04 DIAGNOSIS — G609 Hereditary and idiopathic neuropathy, unspecified: Secondary | ICD-10-CM | POA: Insufficient documentation

## 2020-05-26 ENCOUNTER — Other Ambulatory Visit (HOSPITAL_COMMUNITY): Payer: Self-pay | Admitting: Family Medicine

## 2020-05-26 DIAGNOSIS — M1A00X0 Idiopathic chronic gout, unspecified site, without tophus (tophi): Secondary | ICD-10-CM

## 2020-05-26 DIAGNOSIS — M25562 Pain in left knee: Secondary | ICD-10-CM

## 2020-05-26 DIAGNOSIS — M1712 Unilateral primary osteoarthritis, left knee: Secondary | ICD-10-CM

## 2020-05-26 DIAGNOSIS — Z1389 Encounter for screening for other disorder: Secondary | ICD-10-CM

## 2020-06-02 ENCOUNTER — Telehealth: Payer: Self-pay | Admitting: Adult Health

## 2020-06-02 DIAGNOSIS — G4733 Obstructive sleep apnea (adult) (pediatric): Secondary | ICD-10-CM

## 2020-06-02 DIAGNOSIS — E669 Obesity, unspecified: Secondary | ICD-10-CM

## 2020-06-02 DIAGNOSIS — I4892 Unspecified atrial flutter: Secondary | ICD-10-CM

## 2020-06-02 NOTE — Telephone Encounter (Signed)
Pt called, need another CPAP machine, one I have is leaking, not working good as it should. My insurance will pay for one every five years. Insurance will pay if prescription is sent by 3/31. Would like a call from the nurse.

## 2020-06-03 ENCOUNTER — Ambulatory Visit (HOSPITAL_COMMUNITY)
Admission: RE | Admit: 2020-06-03 | Discharge: 2020-06-03 | Disposition: A | Discharge status: Home / Self Care | Payer: 59 | Source: Ambulatory Visit | Attending: Family Medicine | Admitting: Family Medicine

## 2020-06-03 ENCOUNTER — Other Ambulatory Visit: Payer: Self-pay

## 2020-06-03 DIAGNOSIS — M7122 Synovial cyst of popliteal space [Baker], left knee: Secondary | ICD-10-CM | POA: Insufficient documentation

## 2020-06-03 DIAGNOSIS — M25562 Pain in left knee: Secondary | ICD-10-CM

## 2020-06-03 DIAGNOSIS — M1A9XX0 Chronic gout, unspecified, without tophus (tophi): Secondary | ICD-10-CM

## 2020-06-03 DIAGNOSIS — S83282A Other tear of lateral meniscus, current injury, left knee, initial encounter: Secondary | ICD-10-CM | POA: Diagnosis not present

## 2020-06-03 DIAGNOSIS — M1712 Unilateral primary osteoarthritis, left knee: Secondary | ICD-10-CM | POA: Diagnosis not present

## 2020-06-03 DIAGNOSIS — Z1389 Encounter for screening for other disorder: Secondary | ICD-10-CM | POA: Diagnosis present

## 2020-06-03 DIAGNOSIS — S83242A Other tear of medial meniscus, current injury, left knee, initial encounter: Secondary | ICD-10-CM | POA: Insufficient documentation

## 2020-06-03 DIAGNOSIS — M1A00X0 Idiopathic chronic gout, unspecified site, without tophus (tophi): Secondary | ICD-10-CM | POA: Diagnosis present

## 2020-06-03 NOTE — Telephone Encounter (Signed)
Please advise that Dr. Vickey Huger likes to repeat a home sleep test before ordering a new machine

## 2020-06-04 NOTE — Telephone Encounter (Signed)
Patient can also take his machine to his DME company and have it serviced as this may be able to be fixed?

## 2020-06-04 NOTE — Telephone Encounter (Signed)
I called pt, relayed that per Dr. Vickey Huger she recommends that a repeat HST be ordered prior to ordering a new machine.  His current machine is making loud noise.  His insurance would allow a new machine.  He requested to speak to Dr. Vickey Huger.

## 2020-06-04 NOTE — Addendum Note (Signed)
Addended by: Melvyn Novas on: 06/04/2020 04:52 PM   Modules accepted: Orders

## 2020-06-05 ENCOUNTER — Telehealth: Payer: Self-pay

## 2020-06-05 NOTE — Telephone Encounter (Signed)
LVM for pt to call me back to schedule sleep study  

## 2020-06-05 NOTE — Telephone Encounter (Signed)
Dr. Vickey Huger- ordered HST

## 2020-06-05 NOTE — Telephone Encounter (Signed)
I called pt.  I asked if he had gotten call from Dr.Dohmeier he had not (was disappointed could not get call back).  I relayed that she had ordered HST and sleep lab will authorize and call him to set up.  He states that the machine leaks, makes noise and he is eligible for new machine.  He had not been in touch with DME to fix and since 50 yrs old thought to get new machine.

## 2020-06-10 ENCOUNTER — Telehealth: Payer: Self-pay

## 2020-06-10 NOTE — Telephone Encounter (Signed)
LVM for pt to call me back to schedule sleep study  

## 2020-06-16 ENCOUNTER — Telehealth: Payer: Self-pay

## 2020-06-16 NOTE — Telephone Encounter (Signed)
We have attempted to call the patient two times to schedule sleep study.  Patient has been unavailable at the phone numbers we have on file and has not returned our calls. If patient calls back we will schedule them for their sleep study.  

## 2020-06-19 DIAGNOSIS — N189 Chronic kidney disease, unspecified: Secondary | ICD-10-CM | POA: Insufficient documentation

## 2020-06-19 DIAGNOSIS — M1A00X0 Idiopathic chronic gout, unspecified site, without tophus (tophi): Secondary | ICD-10-CM | POA: Insufficient documentation

## 2020-06-19 DIAGNOSIS — E039 Hypothyroidism, unspecified: Secondary | ICD-10-CM | POA: Insufficient documentation

## 2020-06-19 DIAGNOSIS — I10 Essential (primary) hypertension: Secondary | ICD-10-CM | POA: Insufficient documentation

## 2020-07-14 NOTE — Progress Notes (Signed)
Office Visit Note  Patient: Willie Daniel             Date of Birth: Mar 06, 1971           MRN: [id number]             PCP: Sharilyn Sites, MD Referring: Sharilyn Sites, MD Visit Date: 07/15/2020 Occupation: '@GUAROCC' @  Subjective:  New Patient (Initial Visit) (Patient was previously seen by [facility] for gout. Patient feels as if gout is not well controlled with current treatment regimen. )   History of Present Illness: Willie Daniel is a 50 y.o. male here for evaluation of osteoarthritis, gouty arthritis, with chronic left knee pain.  He has had chronic joint pains and episodic gout flares in numerous areas going back more than 10 years with involvement in his feet knees elbows hands and possibly shoulders.  He also has some known osteoarthritis in joints and was recently seen by orthopedic surgery clinic with steroid injection of the right knee and hyaluronic acid supplementation injection of the left knee.  He has had a previous left knee arthroscopy in 2014.  His gout has been problematic for a long time he is seeing [facility] rheumatology for this issue during the past approximately 1.5 years he was titrated to a high dose of allopurinol with as needed colchicine use and achieved a uric acid goal of less than 6.  However despite this he has had a more gout pain and inflammation in the past 6 months than ever before.  He was previously recommended against taking prophylactic colchicine daily with what sounds like some liver or renal function changes on labs and also having chronic diarrhea from the medicine.  For recent flares he received a steroid taper prescription from his PCP that was much more beneficial for the symptoms flare this was around March. He also thinks that trying the colchicine tablets was more effective than capsules for recent flares. He is concerned about lack of disease control despite good uric acid level and stopping all alcohol use and red meats and  shellfish and very limited soft drink use and wonders if different treatments might be more beneficial.  Activities of Daily Living:  Patient reports morning stiffness for several hours.   Patient Reports nocturnal pain.  Difficulty dressing/grooming: Reports Difficulty climbing stairs: Reports Difficulty getting out of chair: Reports Difficulty using hands for taps, buttons, cutlery, and/or writing: Reports  Review of Systems  Constitutional: Positive for fatigue.  HENT: Negative for mouth sores, mouth dryness and nose dryness.   Eyes: Positive for visual disturbance and dryness. Negative for pain and itching.  Respiratory: Positive for shortness of breath. Negative for cough, hemoptysis and difficulty breathing.   Cardiovascular: Positive for swelling in legs/feet. Negative for chest pain and palpitations.  Gastrointestinal: Positive for blood in stool. Negative for abdominal pain, constipation and diarrhea.  Endocrine: Negative for increased urination.  Genitourinary: Negative for painful urination.  Musculoskeletal: Positive for arthralgias, joint pain, joint swelling, myalgias, morning stiffness and myalgias. Negative for muscle weakness and muscle tenderness.  Skin: Negative for color change, rash and redness.  Allergic/Immunologic: Negative for susceptible to infections.  Neurological: Positive for weakness. Negative for dizziness, numbness, headaches and memory loss.  Hematological: Negative for swollen glands.  Psychiatric/Behavioral: Negative for confusion and sleep disturbance.    PMFS History:  Patient Active Problem List   Diagnosis Date Noted  . Bilateral primary osteoarthritis of knee 07/15/2020  . Benign essential hypertension 06/19/2020  . Chronic  gouty arthritis 06/19/2020  . Chronic kidney disease 06/19/2020  . Hypothyroidism 06/19/2020  . Idiopathic peripheral neuropathy 03/04/2020  . Chronic systolic CHF (congestive heart failure) ([place]) 01/29/2019  . History  of cardioversion 07/21/2017  . Atrial flutter with rapid ventricular response ([place]) 07/21/2017  . AVNRT (AV nodal re-entry tachycardia) ([place]) 05/26/2017  . Nocturnal hypoxemia 07/15/2016  . OSA on CPAP 07/17/2015  . Atrial flutter ([place]) 04/10/2015  . L-loop transposition of great arteries 10/12/2011    Past Medical History:  Diagnosis Date  . Hypertension   . Neuropathy   . OSA on CPAP 07/17/2015  . Osteoarthritis     Family History  Problem Relation Age of Onset  . Multiple myeloma Mother   . Healthy Brother   . Healthy Brother   . Healthy Daughter   . Healthy Daughter   . Healthy Son    Past Surgical History:  Procedure Laterality Date  . CARDIAC SURGERY  1976   age 56 and age 20 at 12  . ELECTROPHYSIOLOGIC STUDY N/A 04/14/2015   Procedure: Cardioversion;  Surgeon: Deboraha Sprang, MD;  Location: [facility];  Service: Cardiovascular;  Laterality: N/A;  . I & D EXTREMITY  2004   right leg infection  . KNEE ARTHROSCOPY WITH MEDIAL MENISECTOMY Left 12/14/2012   Procedure: KNEE ARTHROSCOPY WITH PARTIAL MEDIAL AND LATERAL MENISECTOMY EXTENSIVE SYNOVECTOMY REMOVAL NODULE LEFT KNEE;  Surgeon: Ninetta Lights, MD;  Location: [facility];  Service: Orthopedics;  Laterality: Left;  . TEE WITHOUT CARDIOVERSION N/A 04/14/2015   Procedure: TRANSESOPHAGEAL ECHOCARDIOGRAM (TEE);  Surgeon: Josue Hector, MD;  Location: [facility];  Service: Cardiovascular;  Laterality: N/A;  . VASECTOMY  2010   general anesth   Social History   Social History Narrative   Lives at home with wife and kids   Caffeine use: Tea/soda occass (2-3 per week)   Immunization History  Administered Date(s) Administered  . PFIZER(Purple Top)SARS-COV-2 Vaccination 06/04/2019     Objective: Vital Signs: BP 130/85 (BP Location: Right Arm, Patient Position: Sitting, Cuff Size: Normal)   Pulse 60   Ht '6\' 1"'  (1.854 m)   Wt 278 lb (126.1 kg)   BMI 36.68 kg/m    Physical Exam Constitutional:       Appearance: He is obese.  HENT:     Right Ear: External ear normal.     Left Ear: External ear normal.     Mouth/Throat:     Mouth: Mucous membranes are moist.     Pharynx: Oropharynx is clear.  Eyes:     Conjunctiva/sclera: Conjunctivae normal.  Cardiovascular:     Rate and Rhythm: Normal rate and regular rhythm.  Pulmonary:     Effort: Pulmonary effort is normal.     Breath sounds: Normal breath sounds.  Skin:    General: Skin is warm and dry.     Findings: No rash.  Neurological:     General: No focal deficit present.     Mental Status: He is alert.  Psychiatric:        Mood and Affect: Mood normal.    Musculoskeletal Exam:  Shoulders full ROM b/l Elbows full ROM right side mild tenderness with full extension no palpable swelling or nodules Wrists right side decreased extension ROM Fingers right hand erythema and swelling including the 2nd-4th fingers MCP and PIP joints, left hand normal Knees full ROM, no swelling, left > right crepitus bilaterally Ankles full ROM no tenderness or swelling MTPs 1st digit  bony enlargement and decreased ROM b/l, left foot 2-3rd MTPs cocked up toe deformities   Investigation: No additional findings.  Imaging: XR Foot 2 Views Left  Result Date: 07/16/2020 X-ray left foot 2 views Tibiotalar joint space is normal but with significant anterior osteophytes.  Posterior and plantar calcaneal enthesophytes are present.  Midfoot joints with dorsal osteophyte formation also some erosive changes.  First MTP joint shows extensive cystic and erosive changes with punched out lesions and well demarcated sclerosis.  Cocked up toe deformity and lateral deviation of second and fourth digits with some probable erosive change on lateral aspect. Impression Mixed well-demarcated erosion and degenerative and cystic changes most consistent for erosive gouty arthritis  XR Foot 2 Views Right  Result Date: 07/16/2020 X-ray right foot 2 views Normal tibiotalar  joint space and alignment.  Posterior bone spurring is visible.  There are some calcifications or loose bodies present in soft tissue medial to the tibia.  Arthritis in midfoot joints with dorsal osteophytes.  Severe degenerative possibly some destructive arthritis in the medial cuneiform and metatarsal joint and in the first DIP and PIP joints.  Bone mineralization appears normal. Impression Moderate osteoarthritic change in the midfoot with severe first MTP and proximal phalanx arthritis with cystic changes consistent for erosive crystalline arthropathy  XR Hand 2 View Left  Result Date: 07/16/2020 X-ray left hand 2 views Radiocarpal joint space appears some narrowing there is bone spurring at the radial ulnar joint.  Moderate degenerative arthritis at first [facility] joint.  There are erosive changes at the ulnar head and around triquetrum.  Periarticular erosion at second proximal phalanx base MCP joints otherwise normal.  Periosteal reaction in the proximal phalanges some early osteophyte changes in PIP and DIP joints.  Possible erosion or cystic change at third DIP.  There are multiple cystic changes most at hands and proximal phalanges.  Bone mineralization appears normal. Impression Erosive joint changes present both in wrist and proximal and distal finger joints also with periosteal reaction and osteophyte formation looks consistent with chronic gouty arthritis could also be seen in psoriatic disease or other crystalline arthritis.  XR Hand 2 View Right  Result Date: 07/16/2020 X-ray right hand 2 views Radiocarpal joint space is severely narrowed with increased subchondral sclerosis.  There are erosive changes and abnormal osteophyte formation at the radial ulnar joint and ulnar styloid.  Multiple cystic changes present throughout carpal bones.  Mild first CMC joint arthritis.  MCP joints appear normal.  There is periosteal reaction in the distal half of proximal phalanges.  Multiple well-defined cystic  changes in the proximal phalanges heads and base of middle phalanges.  No joint erosions are visible.  Bone mineralization appears normal. Impression Extensive wrist arthritis and PIP joint arthritis with cystic changes would be consistent with chronic gouty arthritis or other crystalline arthritis   Recent Labs: Lab Results  Component Value Date   WBC 6.2 07/15/2020   HGB 12.8 (L) 07/15/2020   PLT 287 07/15/2020   NA 140 07/15/2020   K 4.2 07/15/2020   CL 105 07/15/2020   CO2 24 07/15/2020   GLUCOSE 85 07/15/2020   BUN 22 07/15/2020   CREATININE 1.02 07/15/2020   BILITOT 0.5 07/15/2020   AST 19 07/15/2020   ALT 19 07/15/2020   PROT 7.2 07/15/2020   CALCIUM 9.6 07/15/2020   GFRAA 97 07/15/2020    Speciality Comments: No specialty comments available.  Procedures:  No procedures performed Allergies: Patient has no known allergies.   Assessment /  Plan:     Visit Diagnoses: Chronic gouty arthritis - Plan: XR Hand 2 View Right, XR Hand 2 View Left, XR Foot 2 Views Right, XR Foot 2 Views Left, Uric acid, Sedimentation rate, CBC with Differential/Platelet, COMPLETE METABOLIC PANEL WITH GFR  Longstanding history of chronic gouty arthritis symptoms have not improved significantly despite achieving normal serum uric acid levels since late last year on allopurinol.  As needed colchicine has not been controlling his inflammation very effectively and he reports some diarrhea with taking it and previously had to stop long-term use on account of some kind of kidney function test abnormalities it sounds like. He has current inflammation in multiple joints especially the digits of the right hand on exam today.  We will recheck uric acid sedimentation rate also CBC and CMP at this time.  Checking x-rays of bilateral hands and feet for evidence of tophaceous and erosive changes.  Ongoing symptoms may be due to existing urate crystal deposition despite achieving normal serum values however may benefit  from trial of alternative urate lowering treatment. Requesting previous rheumatology office records to review before any more extensive workup or changing medications completely.  Bilateral primary osteoarthritis of knee  Bilateral knee OA but without inflammatory changes on exam today.  Chronic kidney disease, unspecified CKD stage  Some type of chronic kidney disease reported I did not see any documented metabolic panel with the abnormal serum creatinine. Checking CMP today. He has longstanding complicated cardiac management on account of congenital TGA.  Orders: Orders Placed This Encounter  Procedures  . XR Hand 2 View Right  . XR Hand 2 View Left  . XR Foot 2 Views Right  . XR Foot 2 Views Left  . Uric acid  . Sedimentation rate  . CBC with Differential/Platelet  . COMPLETE METABOLIC PANEL WITH GFR   No orders of the defined types were placed in this encounter.   Follow-Up Instructions: Return in about 2 weeks (around 07/29/2020) for Gout f/u .   Collier Salina, MD  Note - This record has been created using [employer].  Chart creation errors have been sought, but may not always  have been located. Such creation errors do not reflect on  the standard of medical care.

## 2020-07-15 ENCOUNTER — Telehealth: Payer: Self-pay

## 2020-07-15 ENCOUNTER — Ambulatory Visit (INDEPENDENT_AMBULATORY_CARE_PROVIDER_SITE_OTHER): Payer: 59

## 2020-07-15 ENCOUNTER — Other Ambulatory Visit: Payer: Self-pay

## 2020-07-15 ENCOUNTER — Ambulatory Visit: Payer: Self-pay

## 2020-07-15 ENCOUNTER — Encounter: Payer: Self-pay | Admitting: Internal Medicine

## 2020-07-15 ENCOUNTER — Ambulatory Visit: Payer: 59 | Admitting: Internal Medicine

## 2020-07-15 VITALS — BP 130/85 | HR 60 | Ht 73.0 in | Wt 278.0 lb

## 2020-07-15 DIAGNOSIS — M79641 Pain in right hand: Secondary | ICD-10-CM

## 2020-07-15 DIAGNOSIS — M1A00X0 Idiopathic chronic gout, unspecified site, without tophus (tophi): Secondary | ICD-10-CM | POA: Diagnosis not present

## 2020-07-15 DIAGNOSIS — N189 Chronic kidney disease, unspecified: Secondary | ICD-10-CM

## 2020-07-15 DIAGNOSIS — M79672 Pain in left foot: Secondary | ICD-10-CM

## 2020-07-15 DIAGNOSIS — M79642 Pain in left hand: Secondary | ICD-10-CM | POA: Diagnosis not present

## 2020-07-15 DIAGNOSIS — M170 Bilateral primary osteoarthritis of knee: Secondary | ICD-10-CM | POA: Insufficient documentation

## 2020-07-15 DIAGNOSIS — M79671 Pain in right foot: Secondary | ICD-10-CM | POA: Diagnosis not present

## 2020-07-15 NOTE — Telephone Encounter (Signed)
[name] from [facility] called stating they only got the top portion of patient's medical record request and requesting it be re-faxed to their office.

## 2020-07-15 NOTE — Telephone Encounter (Signed)
Records release has been re-faxed to [facility].

## 2020-07-16 LAB — COMPLETE METABOLIC PANEL WITH GFR
AG Ratio: 1.4 (calc) (ref 1.0–2.5)
ALT: 19 U/L (ref 9–46)
AST: 19 U/L (ref 10–35)
Albumin: 4.2 g/dL (ref 3.6–5.1)
Alkaline phosphatase (APISO): 102 U/L (ref 35–144)
BUN: 22 mg/dL (ref 7–25)
CO2: 24 mmol/L (ref 20–32)
Calcium: 9.6 mg/dL (ref 8.6–10.3)
Chloride: 105 mmol/L (ref 98–110)
Creat: 1.02 mg/dL (ref 0.70–1.33)
GFR, Est African American: 97 mL/min/{1.73_m2} (ref >=60)
GFR, Est Non African American: 84 mL/min/{1.73_m2} (ref >=60)
Globulin: 3 g/dL (calc) (ref 1.9–3.7)
Glucose, Bld: 85 mg/dL (ref 65–99)
Potassium: 4.2 mmol/L (ref 3.5–5.3)
Sodium: 140 mmol/L (ref 135–146)
Total Bilirubin: 0.5 mg/dL (ref 0.2–1.2)
Total Protein: 7.2 g/dL (ref 6.1–8.1)

## 2020-07-16 LAB — CBC WITH DIFFERENTIAL/PLATELET
Absolute Monocytes: 459 cells/uL (ref 200–950)
Basophils Absolute: 50 cells/uL (ref 0–200)
Basophils Relative: 0.8 %
Eosinophils Absolute: 267 cells/uL (ref 15–500)
Eosinophils Relative: 4.3 %
HCT: 40.2 % (ref 38.5–50.0)
Hemoglobin: 12.8 g/dL — ABNORMAL LOW (ref 13.2–17.1)
Lymphs Abs: 2244 cells/uL (ref 850–3900)
MCH: 28.3 pg (ref 27.0–33.0)
MCHC: 31.8 g/dL — ABNORMAL LOW (ref 32.0–36.0)
MCV: 88.7 fL (ref 80.0–100.0)
MPV: 10.2 fL (ref 7.5–12.5)
Monocytes Relative: 7.4 %
Neutro Abs: 3181 cells/uL (ref 1500–7800)
Neutrophils Relative %: 51.3 %
Platelets: 287 10*3/uL (ref 140–400)
RBC: 4.53 10*6/uL (ref 4.20–5.80)
RDW: 15.9 % — ABNORMAL HIGH (ref 11.0–15.0)
Total Lymphocyte: 36.2 %
WBC: 6.2 10*3/uL (ref 3.8–10.8)

## 2020-07-16 LAB — SEDIMENTATION RATE: Sed Rate: 31 mm/h — ABNORMAL HIGH (ref 0–20)

## 2020-07-16 LAB — URIC ACID: Uric Acid, Serum: 4.1 mg/dL (ref 4.0–8.0)

## 2020-07-16 NOTE — Progress Notes (Signed)
Lab results show uric acid is 4.1 which is at the goal for now. Sedimentation rate is increased this reflects the ongoing inflammation activity. The liver and kidney function look fine. Xrays of the hands and feet show some small erosions of the joints that look consistent with damage from gout. I have not yet received outside office records to review which I would like to see before recommending a total change in the treatments. If there is a bad flare up before our next visit he can let us know, I would recommend joint aspiration/injection if a large joint swelling is present for diagnosis and treatment purposes. Could also use of short term steroids for controlling if he has a bad flare up.

## 2020-07-28 ENCOUNTER — Ambulatory Visit: Payer: 59 | Admitting: Adult Health

## 2020-07-28 ENCOUNTER — Encounter: Payer: Self-pay | Admitting: Adult Health

## 2020-07-30 ENCOUNTER — Ambulatory Visit: Payer: 59 | Admitting: Internal Medicine

## 2020-08-11 NOTE — Progress Notes (Signed)
Office Visit Note  Patient: Willie Daniel             Date of Birth: [birth year]           MRN: [id number]             PCP: Sharilyn Sites, MD Referring: Sharilyn Sites, MD Visit Date: 08/12/2020   Subjective:  Gout (Patient complains of pain associated with gout flare in left foot, right knee, left elbow, and right shoulder. Patient has been taking Allopurinol 600 mg and Colchicine tablets PRN. )   History of Present Illness: Willie Daniel is a 50 y.o. male here for follow up for chronic, erosive gouty arthritis currently on allopurinol 600 mg PO daily and colchicine 0.19m PO daily. Since his last visit symptoms remain severe with pain pretty much every day. Currently experiencing this with his right shoulder, left elbow, right knee, and left foot. His hand swelling is somewhat less than previous. He borrowed some colchicine tablets and thinks these are slightly more effective than the capsules he had been taking. Symptoms are overall very problematic interfering with daily activity. Workup at last visit demonstrated erosive changes in multiple joints. Uric acid was remaining at goal since end of last year without any reduction in symptoms.    Review of Systems  Constitutional: Negative for fatigue.  HENT: Positive for mouth dryness. Negative for mouth sores and nose dryness.   Eyes: Negative for pain, itching, visual disturbance and dryness.  Respiratory: Positive for shortness of breath. Negative for cough, hemoptysis and difficulty breathing.   Cardiovascular: Positive for swelling in legs/feet. Negative for chest pain and palpitations.  Gastrointestinal: Negative for abdominal pain, blood in stool, constipation and diarrhea.  Endocrine: Negative for increased urination.  Genitourinary: Negative for painful urination.  Musculoskeletal: Positive for arthralgias, joint pain, joint swelling, muscle weakness and morning stiffness. Negative for myalgias, muscle tenderness and myalgias.   Skin: Positive for redness. Negative for color change and rash.  Allergic/Immunologic: Negative for susceptible to infections.  Neurological: Negative for dizziness, numbness, headaches, memory loss and weakness.  Hematological: Negative for swollen glands.  Psychiatric/Behavioral: Positive for sleep disturbance. Negative for confusion.    PMFS History:  Patient Active Problem List   Diagnosis Date Noted  . Bilateral primary osteoarthritis of knee 07/15/2020  . Benign essential hypertension 06/19/2020  . Chronic gouty arthritis 06/19/2020  . Chronic kidney disease 06/19/2020  . Hypothyroidism 06/19/2020  . Idiopathic peripheral neuropathy 03/04/2020  . Chronic systolic CHF (congestive heart failure) ([facility] 01/29/2019  . History of cardioversion 07/21/2017  . Atrial flutter with rapid ventricular response ([facility] 07/21/2017  . AVNRT (AV nodal re-entry tachycardia) ([facility] 05/26/2017  . Nocturnal hypoxemia 07/15/2016  . OSA on CPAP 07/17/2015  . Atrial flutter ([facility] 04/10/2015  . L-loop transposition of great arteries 10/12/2011    Past Medical History:  Diagnosis Date  . Hypertension   . Neuropathy   . OSA on CPAP 07/17/2015  . Osteoarthritis     Family History  Problem Relation Age of Onset  . Multiple myeloma Mother   . Healthy Brother   . Healthy Brother   . Healthy Daughter   . Healthy Daughter   . Healthy Son    Past Surgical History:  Procedure Laterality Date  . CARDIAC SURGERY  1976   age [age]and age [age]at D71 . ELECTROPHYSIOLOGIC STUDY N/A 04/14/2015   Procedure: Cardioversion;  Surgeon: [place] Sprang MD;  [facility];  Service:  Cardiovascular;  Laterality: N/A;  . I & D EXTREMITY  2004   right leg infection  . KNEE ARTHROSCOPY WITH MEDIAL MENISECTOMY Left 12/14/2012   Procedure: KNEE ARTHROSCOPY WITH PARTIAL MEDIAL AND LATERAL MENISECTOMY EXTENSIVE SYNOVECTOMY REMOVAL NODULE LEFT KNEE;  Surgeon: Ninetta Lights, MD;  Location: [place];  Service: Orthopedics;  Laterality: Left;  . TEE WITHOUT CARDIOVERSION N/A 04/14/2015   Procedure: TRANSESOPHAGEAL ECHOCARDIOGRAM (TEE);  Surgeon: Josue Hector, MD;  [facility] ENDOSCOPY;  Service: Cardiovascular;  Laterality: N/A;  . VASECTOMY  2010   general anesth   Social History   Social History Narrative   Lives at home with wife and kids   Caffeine use: Tea/soda occass (2-3 per week)   Immunization History  Administered Date(s) Administered  . PFIZER(Purple Top)SARS-COV-2 Vaccination 06/04/2019     Objective: Vital Signs: BP 100/67 (BP Location: Right Arm, Patient Position: Sitting, Cuff Size: Normal)   Pulse 79   Ht 6' 2" (1.88 m)   Wt 272 lb (123.4 kg)   BMI 34.92 kg/m    Physical Exam Constitutional:      Appearance: He is obese.  Skin:    General: Skin is warm and dry.     Findings: No rash.  Neurological:     General: No focal deficit present.     Mental Status: He is alert.  Psychiatric:        Mood and Affect: Mood normal.     Musculoskeletal Exam:  Shoulders full ROM no swelling Left olecranon bursa erythema, warmth, pain with full extension Fingers full ROM MCP tenderness present without palpable synovitis Knees right joint line tenderness to palpation worst medially no palpable effusion Ankles full ROM left side pain at joint line and dorsal foot with movement MTPs chronic changes without current podagra   Investigation: No additional findings.  Imaging: XR Foot 2 Views Left  Result Date: 07/16/2020 X-ray left foot 2 views Tibiotalar joint space is normal but with significant anterior osteophytes.  Posterior and plantar calcaneal enthesophytes are present.  Midfoot joints with dorsal osteophyte formation also some erosive changes.  First MTP joint shows extensive cystic and erosive changes with punched out lesions and well demarcated sclerosis.  Cocked up toe deformity and lateral deviation of second and fourth digits with some probable  erosive change on lateral aspect. Impression Mixed well-demarcated erosion and degenerative and cystic changes most consistent for erosive gouty arthritis  XR Foot 2 Views Right  Result Date: 07/16/2020 X-ray right foot 2 views Normal tibiotalar joint space and alignment.  Posterior bone spurring is visible.  There are some calcifications or loose bodies present in soft tissue medial to the tibia.  Arthritis in midfoot joints with dorsal osteophytes.  Severe degenerative possibly some destructive arthritis in the medial cuneiform and metatarsal joint and in the first DIP and PIP joints.  Bone mineralization appears normal. Impression Moderate osteoarthritic change in the midfoot with severe first MTP and proximal phalanx arthritis with cystic changes consistent for erosive crystalline arthropathy  XR Hand 2 View Left  Result Date: 07/16/2020 X-ray left hand 2 views Radiocarpal joint space appears some narrowing there is bone spurring at the radial ulnar joint.  Moderate degenerative arthritis at first [facility] joint.  There are erosive changes at the ulnar head and around triquetrum.  Periarticular erosion at second proximal phalanx base MCP joints otherwise normal.  Periosteal reaction in the proximal phalanges some early osteophyte changes in PIP and DIP joints.  Possible erosion  or cystic change at third DIP.  There are multiple cystic changes most at hands and proximal phalanges.  Bone mineralization appears normal. Impression Erosive joint changes present both in wrist and proximal and distal finger joints also with periosteal reaction and osteophyte formation looks consistent with chronic gouty arthritis could also be seen in psoriatic disease or other crystalline arthritis.  XR Hand 2 View Right  Result Date: 07/16/2020 X-ray right hand 2 views Radiocarpal joint space is severely narrowed with increased subchondral sclerosis.  There are erosive changes and abnormal osteophyte formation at the radial  ulnar joint and ulnar styloid.  Multiple cystic changes present throughout carpal bones.  Mild first CMC joint arthritis.  MCP joints appear normal.  There is periosteal reaction in the distal half of proximal phalanges.  Multiple well-defined cystic changes in the proximal phalanges heads and base of middle phalanges.  No joint erosions are visible.  Bone mineralization appears normal. Impression Extensive wrist arthritis and PIP joint arthritis with cystic changes would be consistent with chronic gouty arthritis or other crystalline arthritis   Recent Labs: Lab Results  Component Value Date   WBC 6.2 07/15/2020   HGB 12.8 (L) 07/15/2020   PLT 287 07/15/2020   NA 140 07/15/2020   K 4.2 07/15/2020   CL 105 07/15/2020   CO2 24 07/15/2020   GLUCOSE 85 07/15/2020   BUN 22 07/15/2020   CREATININE 1.02 07/15/2020   BILITOT 0.5 07/15/2020   AST 19 07/15/2020   ALT 19 07/15/2020   PROT 7.2 07/15/2020   CALCIUM 9.6 07/15/2020   GFRAA 97 07/15/2020    Speciality Comments: No specialty comments available.  Procedures:  No procedures performed Allergies: Patient has no known allergies.   Assessment / Plan:     Visit Diagnoses: Chronic gouty arthritis - Plan: colchicine 0.6 MG tablet, predniSONE (DELTASONE) 10 MG tablet, Uric acid, Sedimentation rate, febuxostat (ULORIC) 40 MG tablet  Symptoms appear consistent with a chronic, tophaceous, erosive, polyarticular gouty arthritis based on xrays and POCUS exam of elbow suggestive for tophi underlying the large soft tissue swelling. Failure to improve after prolonged treatment is of concern especially with radiographic damage. Recommend switching to allopurinol due to treatment failure, starting at 40mg daily dose. Attempt for aspiration or olecranon bursa dry tap today, trying to confirm process with him never having had crystal confirmed disease. IM kenalog 80mg administered today and Rx for 10 day prednisone taper. Repeating ESR and uric acid  levels today.  Chronic kidney disease, unspecified CKD stage  History of CKD but eGFR wnl on latest metabolic panel. High potency loop diuretics though may be affecting urinary uric acid excretion.  Chronic systolic CHF (congestive heart failure) (HCC)  CHF and also history of atrial flutter with previous cardioversion and currently long term anticoagulation with apixaban. Limiting prednisone to shorter term use for now with close followup afterwards and discussed risk this can complicate other issues but currently very debilitating symptoms.  Orders: Orders Placed This Encounter  Procedures  . Uric acid  . Sedimentation rate   Meds ordered this encounter  Medications  . colchicine 0.6 MG tablet    Sig: Take 1 tablet (0.6 mg total) by mouth as needed.    Dispense:  30 tablet    Refill:  2  . predniSONE (DELTASONE) 10 MG tablet    Sig: Take 5 tablets (50 mg total) by mouth daily with breakfast for 2 days, THEN 4 tablets (40 mg total) daily with breakfast for 2 days,   THEN 3 tablets (30 mg total) daily with breakfast for 2 days, THEN 2 tablets (20 mg total) daily with breakfast for 2 days, THEN 1 tablet (10 mg total) daily with breakfast for 2 days.    Dispense:  30 tablet    Refill:  0  . febuxostat (ULORIC) 40 MG tablet    Sig: Take 1 tablet (40 mg total) by mouth daily.    Dispense:  30 tablet    Refill:  0     Follow-Up Instructions: Return in about 10 days (around 08/22/2020) for Gout f/u.   Christopher W Rice, MD  Note - This record has been created using Dragon software.  Chart creation errors have been sought, but may not always  have been located. Such creation errors do not reflect on  the standard of medical care.  

## 2020-08-12 ENCOUNTER — Ambulatory Visit: Payer: 59 | Admitting: Internal Medicine

## 2020-08-12 ENCOUNTER — Other Ambulatory Visit (HOSPITAL_COMMUNITY): Payer: Self-pay

## 2020-08-12 ENCOUNTER — Encounter: Payer: Self-pay | Admitting: Internal Medicine

## 2020-08-12 ENCOUNTER — Other Ambulatory Visit: Payer: Self-pay

## 2020-08-12 ENCOUNTER — Telehealth: Payer: Self-pay | Admitting: Pharmacist

## 2020-08-12 VITALS — BP 100/67 | HR 79 | Ht 74.0 in | Wt 272.0 lb

## 2020-08-12 DIAGNOSIS — N189 Chronic kidney disease, unspecified: Secondary | ICD-10-CM | POA: Diagnosis not present

## 2020-08-12 DIAGNOSIS — M1A00X0 Idiopathic chronic gout, unspecified site, without tophus (tophi): Secondary | ICD-10-CM | POA: Diagnosis not present

## 2020-08-12 DIAGNOSIS — I5022 Chronic systolic (congestive) heart failure: Secondary | ICD-10-CM | POA: Diagnosis not present

## 2020-08-12 MED ORDER — PREDNISONE 10 MG PO TABS: ORAL_TABLET | ORAL | 0 refills | Status: AC

## 2020-08-12 MED ORDER — COLCHICINE 0.6 MG PO TABS: 0.6000 mg | ORAL_TABLET | ORAL | 2 refills | Status: DC | PRN

## 2020-08-12 MED ORDER — FEBUXOSTAT 40 MG PO TABS: 40.0000 mg | ORAL_TABLET | Freq: Every day | ORAL | 0 refills | Status: DC

## 2020-08-12 NOTE — Patient Instructions (Signed)
Prednisone tablets What is this medicine? PREDNISONE (PRED ni sone) is a corticosteroid. It is commonly used to treat inflammation of the skin, joints, lungs, and other organs. Common conditions treated include asthma, allergies, and arthritis. It is also used for other conditions, such as blood disorders and diseases of the adrenal glands. This medicine may be used for other purposes; ask your health care provider or pharmacist if you have questions. COMMON BRAND NAME(S): Deltasone, Predone, Sterapred, Sterapred DS What should I tell my health care provider before I take this medicine? They need to know if you have any of these conditions:  Cushing's syndrome  diabetes  glaucoma  heart disease  high blood pressure  infection (especially a virus infection such as chickenpox, cold sores, or herpes)  kidney disease  liver disease  mental illness  myasthenia gravis  osteoporosis  seizures  stomach or intestine problems  thyroid disease  an unusual or allergic reaction to lactose, prednisone, other medicines, foods, dyes, or preservatives  pregnant or trying to get pregnant  breast-feeding How should I use this medicine? Take this medicine by mouth with a glass of water. Follow the directions on the prescription label. Take this medicine with food. If you are taking this medicine once a day, take it in the morning. Do not take more medicine than you are told to take. Do not suddenly stop taking your medicine because you may develop a severe reaction. Your doctor will tell you how much medicine to take. If your doctor wants you to stop the medicine, the dose may be slowly lowered over time to avoid any side effects. Talk to your pediatrician regarding the use of this medicine in children. Special care may be needed. Overdosage: If you think you have taken too much of this medicine contact a poison control center or emergency room at once. NOTE: This medicine is only for you.  Do not share this medicine with others. What if I miss a dose? If you miss a dose, take it as soon as you can. If it is almost time for your next dose, talk to your doctor or health care professional. You may need to miss a dose or take an extra dose. Do not take double or extra doses without advice. What may interact with this medicine? Do not take this medicine with any of the following medications:  metyrapone  mifepristone This medicine may also interact with the following medications:  aminoglutethimide  amphotericin B  aspirin and aspirin-like medicines  barbiturates  certain medicines for diabetes, like glipizide or glyburide  cholestyramine  cholinesterase inhibitors  cyclosporine  digoxin  diuretics  ephedrine  male hormones, like estrogens and birth control pills  isoniazid  ketoconazole  NSAIDS, medicines for pain and inflammation, like ibuprofen or naproxen  phenytoin  rifampin  toxoids  vaccines  warfarin This list may not describe all possible interactions. Give your health care provider a list of all the medicines, herbs, non-prescription drugs, or dietary supplements you use. Also tell them if you smoke, drink alcohol, or use illegal drugs. Some items may interact with your medicine. What should I watch for while using this medicine? Visit your doctor or health care professional for regular checks on your progress. If you are taking this medicine over a prolonged period, carry an identification card with your name and address, the type and dose of your medicine, and your doctor's name and address. This medicine may increase your risk of getting an infection. Tell your doctor or  health care professional if you are around anyone with measles or chickenpox, or if you develop sores or blisters that do not heal properly. If you are going to have surgery, tell your doctor or health care professional that you have taken this medicine within the last  twelve months. Ask your doctor or health care professional about your diet. You may need to lower the amount of salt you eat. This medicine may increase blood sugar. Ask your healthcare provider if changes in diet or medicines are needed if you have diabetes. What side effects may I notice from receiving this medicine? Side effects that you should report to your doctor or health care professional as soon as possible:  allergic reactions like skin rash, itching or hives, swelling of the face, lips, or tongue  changes in emotions or moods  changes in vision  depressed mood  eye pain  fever or chills, cough, sore throat, pain or difficulty passing urine  signs and symptoms of high blood sugar such as being more thirsty or hungry or having to urinate more than normal. You may also feel very tired or have blurry vision.  swelling of ankles, feet Side effects that usually do not require medical attention (report to your doctor or health care professional if they continue or are bothersome):  confusion, excitement, restlessness  headache  nausea, vomiting  skin problems, acne, thin and shiny skin  trouble sleeping  weight gain This list may not describe all possible side effects. Call your doctor for medical advice about side effects. You may report side effects to FDA at 1-800-FDA-1088. Where should I keep my medicine? Keep out of the reach of children. Store at room temperature between 15 and 30 degrees C (59 and 86 degrees F). Protect from light. Keep container tightly closed. Throw away any unused medicine after the expiration date.   Febuxostat oral tablets What is this medicine? FEBUXOSTAT (feb UX oh stat) is used to treat gout. People with gout have too much uric acid in their body. This medicine works to lower how much uric acid the body makes. This medicine may be used for other purposes; ask your health care provider or pharmacist if you have questions. COMMON BRAND  NAME(S): Uloric What should I tell my health care provider before I take this medicine? They need to know if you have any of these conditions:  heart disease  history of heart attack or stroke  kidney disease  liver disease  taking azathioprine or mercaptopurine  an unusual or allergic reaction to febuxostat, other medicines, foods, dyes, or preservatives  pregnant or trying to get pregnant  breast feeding How should I use this medicine? Take this medicine by mouth with a glass of water. Follow the directions on the prescription label. You can take it with or without food. Take your medicine at regular intervals. Do not take it more often than directed. Do not stop taking except on your doctor's advice. Talk to your pediatrician regarding the use of this medicine in children. This medicine is not approved for use in children. Overdosage: If you think you have taken too much of this medicine contact a poison control center or emergency room at once. NOTE: This medicine is only for you. Do not share this medicine with others. What if I miss a dose? If you miss a dose, take it as soon as you can. If it is almost time for your next dose, take only that dose. Do not take double or  extra doses. What may interact with this medicine? Do not take this medicine with any of the following medications:  azathioprine  mercaptopurine This medicine may also interact with the following medications:  aminophylline  certain medicines used to treat cancer  pegloticase  rasburicase  theophylline This list may not describe all possible interactions. Give your health care provider a list of all the medicines, herbs, non-prescription drugs, or dietary supplements you use. Also tell them if you smoke, drink alcohol, or use illegal drugs. Some items may interact with your medicine. What should I watch for while using this medicine? Visit your healthcare provider for regular checks on your  progress. Tell your healthcare professional if your symptoms do not start to get better or if they get worse. You will need regular blood tests while you are taking this medicine. Your gout may flare up when you are first taking this medicine. Do not stop taking this medicine, even if you have a flare. Your doctor or healthcare provider may give you another medicine to help treat the gout pain. This medicine may cause serious skin reactions. They can happen weeks to months after starting the medicine. Contact your healthcare provider right away if you notice fevers or flu-like symptoms with a rash. The rash may be red or purple and then turn into blisters or peeling of the skin. Or, you might notice a red rash with swelling of the face, lips, or lymph nodes in your neck or under your arms. What side effects may I notice from receiving this medicine? Side effects that you should report to your doctor or health care professional as soon as possible:  allergic reactions like skin rash, itching or hives, swelling of the face, lips, or tongue  breathing problems  chest pain or chest tightness  fast, irregular heartbeat  feeling faint or lightheaded  rash, fever, and swollen lymph nodes  redness, blistering, peeling, or loosening of the skin, including inside the mouth  signs and symptoms of liver injury like dark yellow or brown urine; general ill feeling or flu-like symptoms; light-colored stools; loss of appetite; nausea; right upper belly pain; unusually weak or tired; yellowing of the eyes or skin  signs and symptoms of a stroke like changes in vision; confusion; trouble speaking or understanding; severe headaches; sudden numbness or weakness of the face, arm or leg; trouble walking; dizziness; loss of balance or coordination Side effects that usually do not require medical attention (report to your doctor or health care professional if they continue or are bothersome):  changes in  appetite  joint pain  nausea, vomiting  upset stomach This list may not describe all possible side effects. Call your doctor for medical advice about side effects. You may report side effects to FDA at 1-800-FDA-1088. Where should I keep my medicine? Keep out of the reach of children. Store at room temperature between 15 and 30 degrees C (59 and 86 degrees F). Protect from light and moisture. Throw away any unused medicine after the expiration date.

## 2020-08-12 NOTE — Telephone Encounter (Addendum)
Received notification from [facility] regarding a prior authorization for  febuxostat . Authorization has been APPROVED from 08/12/20 to 08/12/21.   Authorization # [id number] Phone # [phone]  [facility] to advise and they were able to process prescription. Copay is $61.15.  [name], PharmD, MPH Clinical Pharmacist (Rheumatology and Pulmonology)

## 2020-08-12 NOTE — Telephone Encounter (Signed)
Received fax from [facility] regarding febuxostat - not covered by plan and preferred is allopurinol.  Patient has tried and failed allopurinol. Submitted a Prior Authorization request to [facility] for febuxostat 40mg  tabs via CoverMyMeds. Will update once we receive a response.  Key: 

## 2020-08-13 LAB — URIC ACID: Uric Acid, Serum: 5.2 mg/dL (ref 4.0–8.0)

## 2020-08-13 LAB — SEDIMENTATION RATE: Sed Rate: 79 mm/h — ABNORMAL HIGH (ref 0–20)

## 2020-08-18 ENCOUNTER — Other Ambulatory Visit: Payer: Self-pay | Admitting: Radiology

## 2020-08-18 ENCOUNTER — Ambulatory Visit: Payer: 59 | Admitting: Internal Medicine

## 2020-08-18 DIAGNOSIS — M1A00X0 Idiopathic chronic gout, unspecified site, without tophus (tophi): Secondary | ICD-10-CM

## 2020-08-21 ENCOUNTER — Encounter: Payer: Self-pay | Admitting: Internal Medicine

## 2020-08-21 ENCOUNTER — Other Ambulatory Visit: Payer: Self-pay

## 2020-08-21 ENCOUNTER — Ambulatory Visit: Payer: 59 | Admitting: Internal Medicine

## 2020-08-21 VITALS — BP 123/81 | HR 57 | Ht 74.0 in | Wt 277.2 lb

## 2020-08-21 DIAGNOSIS — M1A00X0 Idiopathic chronic gout, unspecified site, without tophus (tophi): Secondary | ICD-10-CM

## 2020-08-21 DIAGNOSIS — M1A9XX0 Chronic gout, unspecified, without tophus (tophi): Secondary | ICD-10-CM

## 2020-08-21 DIAGNOSIS — N189 Chronic kidney disease, unspecified: Secondary | ICD-10-CM | POA: Diagnosis not present

## 2020-08-21 NOTE — Progress Notes (Signed)
Office Visit Note  Patient: Willie Daniel             Date of Birth: August 08, 1970           MRN: [id number]             PCP: Assunta Found, MD Referring: Assunta Found, MD Visit Date: 08/21/2020   Subjective:  Follow-up (Patient feels as if symptoms have improved since last visit / cortisone injection. )   History of Present Illness: Willie Daniel is a 50 y.o. male here for follow up for chronic, erosive, tophaceous, polyarticular gout now on uloric 40 mg PO daily, colchicine 0.6 mg PO daily, and finishing a 2 week prednisone taper for recent flare. After steroid shot and starting prednisone his symptoms are doing well today, able to walk unaided and fully extend joints and swelling improved. No noticeable side effects starting the uloric.    Review of Systems  Constitutional:  Negative for fatigue.  HENT:  Positive for mouth dryness. Negative for mouth sores and nose dryness.   Eyes:  Negative for pain, itching, visual disturbance and dryness.  Respiratory:  Negative for cough, hemoptysis, shortness of breath and difficulty breathing.   Cardiovascular:  Positive for swelling in legs/feet. Negative for chest pain and palpitations.  Gastrointestinal:  Negative for abdominal pain, blood in stool, constipation and diarrhea.  Endocrine: Negative for increased urination.  Genitourinary:  Negative for painful urination.  Musculoskeletal:  Positive for joint pain, joint pain, joint swelling, myalgias, muscle weakness, morning stiffness, muscle tenderness and myalgias.  Skin:  Positive for rash. Negative for color change and redness.  Allergic/Immunologic: Negative for susceptible to infections.  Neurological:  Positive for dizziness. Negative for numbness, headaches, memory loss and weakness.  Hematological:  Negative for swollen glands.  Psychiatric/Behavioral:  Positive for sleep disturbance. Negative for confusion.    PMFS History:  Patient Active Problem List   Diagnosis Date  Noted   Bilateral primary osteoarthritis of knee 07/15/2020   Benign essential hypertension 06/19/2020   Chronic gouty arthritis 06/19/2020   Chronic kidney disease 06/19/2020   Hypothyroidism 06/19/2020   Idiopathic peripheral neuropathy 03/04/2020   Chronic systolic CHF (congestive heart failure) (HCC) 01/29/2019   History of cardioversion 07/21/2017   Atrial flutter with rapid ventricular response (HCC) 07/21/2017   AVNRT (AV nodal re-entry tachycardia) (HCC) 05/26/2017   Nocturnal hypoxemia 07/15/2016   OSA on CPAP 07/17/2015   Atrial flutter (HCC) 04/10/2015   L-loop transposition of great arteries 10/12/2011    Past Medical History:  Diagnosis Date   Hypertension    Neuropathy    OSA on CPAP 07/17/2015   Osteoarthritis     Family History  Problem Relation Age of Onset   Multiple myeloma Mother    Healthy Brother    Healthy Brother    Healthy Daughter    Healthy Daughter    Healthy Son    Past Surgical History:  Procedure Laterality Date   CARDIAC SURGERY  1976   age 24 and age 36 at [facility]   ELECTROPHYSIOLOGIC STUDY N/A 04/14/2015   Procedure: Cardioversion;  Surgeon: [facility] Salvia, MD;  Location: [facility] INVASIVE CV LAB;  Service: Cardiovascular;  Laterality: N/A;   I & D EXTREMITY  2004   right leg infection   KNEE ARTHROSCOPY WITH MEDIAL MENISECTOMY Left 12/14/2012   Procedure: KNEE ARTHROSCOPY WITH PARTIAL MEDIAL AND LATERAL MENISECTOMY EXTENSIVE SYNOVECTOMY REMOVAL NODULE LEFT KNEE;  Surgeon: Loreta Ave, MD;  Location: [facility];  Service: Orthopedics;  Laterality: Left;   TEE WITHOUT CARDIOVERSION N/A 04/14/2015   Procedure: TRANSESOPHAGEAL ECHOCARDIOGRAM (TEE);  Surgeon: Josue Hector, MD;  Location: [facility] ENDOSCOPY;  Service: Cardiovascular;  Laterality: N/A;   VASECTOMY  2010   general anesth   Social History   Social History Narrative   Lives at home with wife and kids   Caffeine use: Tea/soda occass (2-3 per week)   Immunization History   Administered Date(s) Administered   PFIZER(Purple Top)SARS-COV-2 Vaccination 06/04/2019     Objective: Vital Signs: BP 123/81 (BP Location: Left Arm, Patient Position: Sitting, Cuff Size: Normal)   Pulse (!) 57   Ht $R'6\' 2"'Sq$  (1.88 m)   Wt 277 lb 3.2 oz (125.7 kg)   BMI 35.59 kg/m    Physical Exam Skin:    General: Skin is warm and dry.     Findings: No rash.  Neurological:     General: No focal deficit present.     Mental Status: He is alert.  Psychiatric:        Mood and Affect: Mood normal.     Musculoskeletal Exam: Elbows full ROM no tenderness, left olecranon soft tissue nodule with small tophaceous deposits Wrists full ROM no tenderness or swelling Fingers full ROM no tenderness or swelling, chronic MCP enlargement, some postinflammatory skin discoloration Knees full ROM no tenderness or swelling Ankles full ROM no tenderness or swelling  Investigation: No additional findings.  Imaging: No results found.  Recent Labs: Lab Results  Component Value Date   WBC 6.2 07/15/2020   HGB 12.8 (L) 07/15/2020   PLT 287 07/15/2020   NA 140 07/15/2020   K 4.2 07/15/2020   CL 105 07/15/2020   CO2 24 07/15/2020   GLUCOSE 85 07/15/2020   BUN 22 07/15/2020   CREATININE 1.02 07/15/2020   BILITOT 0.5 07/15/2020   AST 19 07/15/2020   ALT 19 07/15/2020   PROT 7.2 07/15/2020   CALCIUM 9.6 07/15/2020   GFRAA 97 07/15/2020    Speciality Comments: No specialty comments available.  Procedures:  No procedures performed Allergies: Patient has no known allergies.   Assessment / Plan:     Visit Diagnoses: Chronic gouty arthritis - Plan: Sedimentation rate, CBC with Differential/Platelet, COMPLETE METABOLIC PANEL WITH GFR  Chronic, erosive, tophaceous, polyarticular gout now on uloric 40 mg PO daily, colchicine 0.6 mg PO daily, and finishing a 2 week prednisone taper for recent flare. Will recheck labs for medication monitoring on uloric. Uric acid needs to be reassessed at  least 6 wks further out. Instructed to contact us if symptoms flare when finishing prednisone. Expect we will need to increase uloric to 80 mg dose in the future but will go stepwise. Discussed possible future use of infusion medication if refractory disease due to the erosive progressive damage.  Chronic kidney disease, unspecified CKD stage  Close monitoring and titrating starting at low dose medication with history of previous CKD, although recent metabolic panel normal.  Orders: Orders Placed This Encounter  Procedures   Sedimentation rate   CBC with Differential/Platelet   COMPLETE METABOLIC PANEL WITH GFR    No orders of the defined types were placed in this encounter.    Follow-Up Instructions: Return in about 6 weeks (around 10/02/2020) for Gout, uloric f/u.   Collier Salina, MD  Note - This record has been created using [employer].  Chart creation errors have been sought, but may not always  have been located. Such creation errors do  not reflect on  the standard of medical care.

## 2020-08-22 LAB — COMPLETE METABOLIC PANEL WITH GFR
AG Ratio: 1.7 (calc) (ref 1.0–2.5)
ALT: 16 U/L (ref 9–46)
AST: 13 U/L (ref 10–35)
Albumin: 4.2 g/dL (ref 3.6–5.1)
Alkaline phosphatase (APISO): 82 U/L (ref 35–144)
BUN: 24 mg/dL (ref 7–25)
CO2: 30 mmol/L (ref 20–32)
Calcium: 9.5 mg/dL (ref 8.6–10.3)
Chloride: 102 mmol/L (ref 98–110)
Creat: 1.25 mg/dL (ref 0.70–1.33)
GFR, Est African American: 76 mL/min/{1.73_m2} (ref >=60)
GFR, Est Non African American: 66 mL/min/{1.73_m2} (ref >=60)
Globulin: 2.5 g/dL (calc) (ref 1.9–3.7)
Glucose, Bld: 101 mg/dL — ABNORMAL HIGH (ref 65–99)
Potassium: 5.6 mmol/L — ABNORMAL HIGH (ref 3.5–5.3)
Sodium: 136 mmol/L (ref 135–146)
Total Bilirubin: 0.8 mg/dL (ref 0.2–1.2)
Total Protein: 6.7 g/dL (ref 6.1–8.1)

## 2020-08-22 LAB — CBC WITH DIFFERENTIAL/PLATELET
Absolute Monocytes: 442 cells/uL (ref 200–950)
Basophils Absolute: 18 cells/uL (ref 0–200)
Basophils Relative: 0.2 %
Eosinophils Absolute: 64 cells/uL (ref 15–500)
Eosinophils Relative: 0.7 %
HCT: 43.4 % (ref 38.5–50.0)
Hemoglobin: 13.7 g/dL (ref 13.2–17.1)
Lymphs Abs: 1932 cells/uL (ref 850–3900)
MCH: 27.3 pg (ref 27.0–33.0)
MCHC: 31.6 g/dL — ABNORMAL LOW (ref 32.0–36.0)
MCV: 86.6 fL (ref 80.0–100.0)
MPV: 9.8 fL (ref 7.5–12.5)
Monocytes Relative: 4.8 %
Neutro Abs: 6744 cells/uL (ref 1500–7800)
Neutrophils Relative %: 73.3 %
Platelets: 356 10*3/uL (ref 140–400)
RBC: 5.01 10*6/uL (ref 4.20–5.80)
RDW: 16.2 % — ABNORMAL HIGH (ref 11.0–15.0)
Total Lymphocyte: 21 %
WBC: 9.2 10*3/uL (ref 3.8–10.8)

## 2020-08-22 LAB — SEDIMENTATION RATE: Sed Rate: 6 mm/h (ref 0–20)

## 2020-08-22 NOTE — Progress Notes (Signed)
Lab results looks okay for continuing uloric and inflammation markers decreased to normal with the prednisone treatment.

## 2020-08-27 DIAGNOSIS — G629 Polyneuropathy, unspecified: Secondary | ICD-10-CM | POA: Insufficient documentation

## 2020-09-04 ENCOUNTER — Other Ambulatory Visit: Payer: Self-pay | Admitting: Internal Medicine

## 2020-09-04 DIAGNOSIS — M1A00X0 Idiopathic chronic gout, unspecified site, without tophus (tophi): Secondary | ICD-10-CM

## 2020-09-22 ENCOUNTER — Other Ambulatory Visit: Payer: Self-pay | Admitting: Internal Medicine

## 2020-09-22 DIAGNOSIS — M1A00X0 Idiopathic chronic gout, unspecified site, without tophus (tophi): Secondary | ICD-10-CM

## 2020-10-02 ENCOUNTER — Ambulatory Visit: Payer: 59 | Admitting: Neurology

## 2020-10-05 NOTE — Progress Notes (Signed)
Office Visit Note  Patient: Willie Daniel             Date of Birth: 10-13-1970           MRN: [id number]             PCP: Sharilyn Sites, MD Referring: Sharilyn Sites, MD Visit Date: 10/06/2020   Subjective:  Follow-up (Patient feels as if gout is well controlled. Patient is taking Uloric 40 mg daily and Colchicine 0.6 mg PRN. )   History of Present Illness: Willie Daniel is a 50 y.o. male here for follow up for chronic, erosive, tophaceous, polyarticular gout on colchicine 0.6 mg PO daily and febuxostat 40 mg PO daily.  He feels overall symptoms are doing reasonably well since her last visit.  He has had about 2-3 flareups of increased joint pain most recently on the right side did improve after about 2 days of taking colchicine.  He was vacationing in [place] recently able to walk around the park all day without interruption or exacerbation of knee arthritis.  This is question about any alternative options or pain options due to the high monthly price of Uloric prescription.  08/21/20 Willie Daniel is a 50 y.o. male here for follow up for chronic, erosive, tophaceous, polyarticular gout now on uloric 40 mg PO daily, colchicine 0.6 mg PO daily, and finishing a 2 week prednisone taper for recent flare. After steroid shot and starting prednisone his symptoms are doing well today, able to walk unaided and fully extend joints and swelling improved. No noticeable side effects starting the uloric.  08/12/20 Willie Daniel is a 50 y.o. male here for follow up for chronic, erosive gouty arthritis currently on allopurinol 600 mg PO daily and colchicine 0.34m PO daily. Since his last visit symptoms remain severe with pain pretty much every day. Currently experiencing this with his right shoulder, left elbow, right knee, and left foot. His hand swelling is somewhat less than previous. He borrowed some colchicine tablets and thinks these are slightly more effective than the capsules he had been taking.  Symptoms are overall very problematic interfering with daily activity. Workup at last visit demonstrated erosive changes in multiple joints. Uric acid was remaining at goal since end of last year without any reduction in symptoms.  07/15/20 Willie HERNEis a 50y.o. male here for evaluation of osteoarthritis, gouty arthritis, with chronic left knee pain.  He has had chronic joint pains and episodic gout flares in numerous areas going back more than 10 years with involvement in his feet knees elbows hands and possibly shoulders.  He also has some known osteoarthritis in joints and was recently seen by orthopedic surgery clinic with steroid injection of the right knee and hyaluronic acid supplementation injection of the left knee.  He has had a previous left knee arthroscopy in 2014.  His gout has been problematic for a long time [facility] during the past approximately 1.5 years he was titrated to a high dose of allopurinol with as needed colchicine use and achieved a uric acid goal of less than 6.  However despite this he has had a more gout pain and inflammation in the past 6 months than ever before.  He was previously recommended against taking prophylactic colchicine daily with what sounds like some liver or renal function changes on labs and also having chronic diarrhea from the medicine.  For recent flares he received a steroid  taper prescription from his PCP that was much more beneficial for the symptoms flare this was around March. He also thinks that trying the colchicine tablets was more effective than capsules for recent flares. He is concerned about lack of disease control despite good uric acid level and stopping all alcohol use and red meats and shellfish and very limited soft drink use and wonders if different treatments might be more beneficial.  Review of Systems  Constitutional:  Positive for fatigue.  HENT:  Positive for mouth sores and mouth  dryness. Negative for nose dryness.   Eyes:  Negative for pain, itching, visual disturbance and dryness.  Respiratory:  Positive for shortness of breath. Negative for cough, hemoptysis and difficulty breathing.   Cardiovascular:  Positive for swelling in legs/feet. Negative for chest pain and palpitations.  Gastrointestinal:  Positive for abdominal pain, blood in stool and constipation. Negative for diarrhea.  Endocrine: Negative for increased urination.  Genitourinary:  Negative for painful urination.  Musculoskeletal:  Positive for joint pain, joint pain, joint swelling and morning stiffness. Negative for myalgias, muscle weakness, muscle tenderness and myalgias.  Skin:  Negative for color change, rash and redness.  Allergic/Immunologic: Negative for susceptible to infections.  Neurological:  Positive for dizziness. Negative for numbness, headaches, memory loss and weakness.  Hematological:  Negative for swollen glands.  Psychiatric/Behavioral:  Negative for confusion and sleep disturbance.    PMFS History:  Patient Active Problem List   Diagnosis Date Noted   Bilateral primary osteoarthritis of knee 07/15/2020   Benign essential hypertension 06/19/2020   Chronic gouty arthritis 06/19/2020   Chronic kidney disease 06/19/2020   Hypothyroidism 06/19/2020   Idiopathic peripheral neuropathy 10/19/4816   Chronic systolic CHF (congestive heart failure) ([place]) 01/29/2019   History of cardioversion 07/21/2017   Atrial flutter with rapid ventricular response ([place]) 07/21/2017   AVNRT (AV nodal re-entry tachycardia) ([place]) 05/26/2017   Nocturnal hypoxemia 07/15/2016   OSA on CPAP 07/17/2015   Atrial flutter ([place]) 04/10/2015   L-loop transposition of great arteries 10/12/2011    Past Medical History:  Diagnosis Date   Hypertension    Neuropathy    OSA on CPAP 07/17/2015   Osteoarthritis     Family History  Problem Relation Age of Onset   Multiple myeloma Mother    Healthy Brother     Healthy Brother    Healthy Daughter    Healthy Daughter    Healthy Son    Past Surgical History:  Procedure Laterality Date   CARDIAC SURGERY  1976   age 68 and age 79 at [facility] 04/14/2015   Procedure: Cardioversion;  Surgeon: Deboraha Sprang, MD;  Location: [place] [facility];  Service: Cardiovascular;  Laterality: N/A;   I & D EXTREMITY  2004   right leg infection   KNEE ARTHROSCOPY WITH MEDIAL MENISECTOMY Left 12/14/2012   Procedure: KNEE ARTHROSCOPY WITH PARTIAL MEDIAL AND LATERAL MENISECTOMY EXTENSIVE SYNOVECTOMY REMOVAL NODULE LEFT KNEE;  Surgeon: Ninetta Lights, MD;  Location: [facility];  Service: Orthopedics;  Laterality: Left;   TEE WITHOUT CARDIOVERSION N/A 04/14/2015   Procedure: TRANSESOPHAGEAL ECHOCARDIOGRAM (TEE);  Surgeon: Josue Hector, MD;  Location: [facility] ENDOSCOPY;  Service: Cardiovascular;  Laterality: N/A;   VASECTOMY  2010   general anesth   Social History   Social History Narrative   Lives at home with wife and kids   Caffeine use: Tea/soda occass (2-3 per week)   Immunization History  Administered Date(s) Administered  PFIZER(Purple Top)SARS-COV-2 Vaccination 06/04/2019     Objective: Vital Signs: BP 118/61 (BP Location: Left Arm, Patient Position: Sitting, Cuff Size: Normal)   Pulse 73   Ht '6\' 2"'  (1.88 m)   Wt 289 lb (131.1 kg)   BMI 37.11 kg/m    Physical Exam Constitutional:      Appearance: He is obese.  Skin:    General: Skin is warm and dry.     Findings: No rash.     Comments: 1+ pedal edema bilateral ankles several tortuous superficial veins no appreciable stasis dermatitis or ulcers  Neurological:     Mental Status: He is alert.     Musculoskeletal Exam:  Neck full ROM no tenderness Shoulders full ROM no tenderness or swelling Elbows full ROM no tenderness or swelling, soft tissue nodule and swelling over left elbow without tenderness or erythema Wrists full ROM no tenderness or  swelling Fingers chronic MCP joint large joint with slightly decreased flexion range of motion no tenderness or swelling Knees nontender no effusions bilateral patellofemoral crepitus and bony enlargement no instability or pain to varus or valgus pressure Ankles full ROM no tenderness  Investigation: No additional findings.  Imaging: No results found.  Recent Labs: Lab Results  Component Value Date   WBC 9.2 08/21/2020   HGB 13.7 08/21/2020   PLT 356 08/21/2020   NA 136 08/21/2020   K 5.6 (H) 08/21/2020   CL 102 08/21/2020   CO2 30 08/21/2020   GLUCOSE 101 (H) 08/21/2020   BUN 24 08/21/2020   CREATININE 1.25 08/21/2020   BILITOT 0.8 08/21/2020   AST 13 08/21/2020   ALT 16 08/21/2020   PROT 6.7 08/21/2020   CALCIUM 9.5 08/21/2020   GFRAA 76 08/21/2020    Speciality Comments: No specialty comments available.  Procedures:  No procedures performed Allergies: Patient has no known allergies.   Assessment / Plan:     Visit Diagnoses: Chronic gouty arthritis - Plan: Uric acid, COMPLETE METABOLIC PANEL WITH GFR, Sedimentation rate  Symptoms seem to be improved compared to earlier this year since switching to the Uloric he is only had about 2-3 episodes of joint pain requiring colchicine.  We will check uric acid level today to see if he remains at goal or requires dose titration.  Checking CMP for medication monitoring.  Checking sedimentation rate for disease activity.  Chronic kidney disease, unspecified CKD stage  History of CKD although estimated GFR appears fine based on most recent metabolic panel rechecking CMP today.  Chronic systolic CHF (congestive heart failure) (HCC)  Chronic systolic heart failure related to his transposition of great arteries.  On torsemide he has small amount of pedal edema currently discussed about require urate lowering therapy dose modification if his loop diuretics are changed long-term.  Orders: Orders Placed This Encounter  Procedures    Uric acid   COMPLETE METABOLIC PANEL WITH GFR   Sedimentation rate    No orders of the defined types were placed in this encounter.    Follow-Up Instructions: Return in about 6 months (around 04/08/2021) for Gout on uloric f/u 6 mos.   Collier Salina, MD  Note - This record has been created using [employer].  Chart creation errors have been sought, but may not always  have been located. Such creation errors do not reflect on  the standard of medical care.

## 2020-10-06 ENCOUNTER — Encounter: Payer: Self-pay | Admitting: Internal Medicine

## 2020-10-06 ENCOUNTER — Other Ambulatory Visit: Payer: Self-pay

## 2020-10-06 ENCOUNTER — Ambulatory Visit: Payer: 59 | Admitting: Internal Medicine

## 2020-10-06 VITALS — BP 118/61 | HR 73 | Ht 74.0 in | Wt 289.0 lb

## 2020-10-06 DIAGNOSIS — N189 Chronic kidney disease, unspecified: Secondary | ICD-10-CM

## 2020-10-06 DIAGNOSIS — I5022 Chronic systolic (congestive) heart failure: Secondary | ICD-10-CM

## 2020-10-06 DIAGNOSIS — M1A00X0 Idiopathic chronic gout, unspecified site, without tophus (tophi): Secondary | ICD-10-CM | POA: Diagnosis not present

## 2020-10-07 ENCOUNTER — Telehealth: Payer: Self-pay | Admitting: Radiology

## 2020-10-07 LAB — URIC ACID: Uric Acid, Serum: 7.6 mg/dL (ref 4.0–8.0)

## 2020-10-07 LAB — COMPLETE METABOLIC PANEL WITH GFR
AG Ratio: 1.7 (calc) (ref 1.0–2.5)
ALT: 20 U/L (ref 9–46)
AST: 20 U/L (ref 10–35)
Albumin: 4.2 g/dL (ref 3.6–5.1)
Alkaline phosphatase (APISO): 64 U/L (ref 35–144)
BUN/Creatinine Ratio: 19 (calc) (ref 6–22)
BUN: 25 mg/dL (ref 7–25)
CO2: 31 mmol/L (ref 20–32)
Calcium: 9.4 mg/dL (ref 8.6–10.3)
Chloride: 103 mmol/L (ref 98–110)
Creat: 1.35 mg/dL — ABNORMAL HIGH (ref 0.70–1.30)
Globulin: 2.5 g/dL (calc) (ref 1.9–3.7)
Glucose, Bld: 100 mg/dL — ABNORMAL HIGH (ref 65–99)
Potassium: 4.7 mmol/L (ref 3.5–5.3)
Sodium: 143 mmol/L (ref 135–146)
Total Bilirubin: 0.9 mg/dL (ref 0.2–1.2)
Total Protein: 6.7 g/dL (ref 6.1–8.1)
eGFR: 63 mL/min/{1.73_m2} (ref >=60)

## 2020-10-07 LAB — SEDIMENTATION RATE: Sed Rate: 2 mm/h (ref 0–20)

## 2020-10-07 MED ORDER — FEBUXOSTAT 80 MG PO TABS: 80.0000 mg | ORAL_TABLET | Freq: Every day | ORAL | 1 refills | Status: DC

## 2020-10-07 NOTE — Addendum Note (Signed)
Addended by: Fuller Plan on: 10/07/2020 08:58 AM   Modules accepted: Orders

## 2020-10-07 NOTE — Telephone Encounter (Signed)
Received drug change request from [facility])- patient was taking Uloric 40 mg daily and Dr. Dimple Casey is now increasing his dose to 80 mg daily. Does not appear medication is covered by insurance. I believe patient was using GoodRx previously, are there any alternative options? Thanks!

## 2020-10-07 NOTE — Progress Notes (Signed)
Lab tests show uric acid is increased to 7.6 on the 40 mg uloric so I do recommend increasing to the 80 mg dose and I sent a new prescription for this. However this does mean he should come back for at a lab visit in 1 month to recheck with this change.

## 2020-10-07 NOTE — Telephone Encounter (Signed)
You could attempt to run a PA through CoverMyMeds, but otherwise checking the [employer] is always a good idea as prices vary between locations and can also fluctuate from time to time. For example, 30 tablets of Uloric 80mg  is $92.84 at [facility] but is cheaper if sent to these other options:  [facility]: $36.47 [facility] (where applicable): $27.01 [facility] (where applicable): $24.31  Refer to goodrx.com and verify with pt if any of the listed options are accessible and/or convenient. You can then generate the card online and provide pt with card information to give to corresponding pharmacy. Hope this helps!

## 2020-10-07 NOTE — Addendum Note (Signed)
Addended by: Fuller Plan on: 10/07/2020 08:54 AM   Modules accepted: Orders

## 2020-10-08 ENCOUNTER — Encounter: Payer: Self-pay | Admitting: Radiology

## 2020-10-16 ENCOUNTER — Telehealth: Payer: Self-pay | Admitting: Pharmacist

## 2020-10-16 NOTE — Telephone Encounter (Addendum)
ATC patient to enroll into [name] that is currently open. Unable to reach but left VM requesting return call as soon as possible. Will f/u tomorrow  [name], PharmD, MPH, BCPS Clinical Pharmacist (Rheumatology and Pulmonology)  ----- Message from Fuller Plan, MD sent at 10/06/2020  1:28 PM EDT ----- Regarding: Uloric price question Willie Daniel is very curious whether any more affordable access options exist since his Rx is more than $30 per month after insurance. I am not aware if there is any specialty pharmacy or program for this is there a way to check? Unfortunately allopurinol failed to control symptoms adequately.

## 2020-10-17 ENCOUNTER — Other Ambulatory Visit (HOSPITAL_COMMUNITY): Payer: Self-pay

## 2020-10-17 NOTE — Telephone Encounter (Signed)
Patient can use Good Rx card for Febuxostat to assist with cost at certain pharmacies:  Copay quotes for 30 day supplies:  [facility]- $27.01  [facility]: $24.31  [facility]- $36.47  [facility]= $34.65

## 2020-10-31 ENCOUNTER — Other Ambulatory Visit: Payer: Self-pay | Admitting: *Deleted

## 2020-10-31 ENCOUNTER — Encounter: Payer: Self-pay | Admitting: Internal Medicine

## 2020-10-31 MED ORDER — COLCHICINE 0.6 MG PO TABS: 0.6000 mg | ORAL_TABLET | ORAL | 2 refills | Status: DC | PRN

## 2020-10-31 MED ORDER — PREDNISONE 10 MG PO TABS: 10.0000 mg | ORAL_TABLET | Freq: Every day | ORAL | 0 refills | Status: DC | PRN

## 2020-10-31 NOTE — Telephone Encounter (Signed)
Sent new Rx for colchicine and prednisone to take as needed for current flare. Ordered 30 tablets but he can just take a few days as needed and others available if repeat flares in next few months.

## 2020-11-04 ENCOUNTER — Other Ambulatory Visit: Payer: Self-pay

## 2020-11-04 DIAGNOSIS — M1A00X0 Idiopathic chronic gout, unspecified site, without tophus (tophi): Secondary | ICD-10-CM

## 2020-11-04 DIAGNOSIS — N189 Chronic kidney disease, unspecified: Secondary | ICD-10-CM

## 2020-11-04 NOTE — Telephone Encounter (Signed)
Patient called requesting prescription refill of Febuxostat.  Patient is requesting it be sent to a NEW PHARMACY - [facility] at [employer].  Patient is also requesting a 90 day supply.

## 2020-11-05 ENCOUNTER — Encounter: Payer: Self-pay | Admitting: Neurology

## 2020-11-05 ENCOUNTER — Other Ambulatory Visit: Payer: Self-pay

## 2020-11-05 ENCOUNTER — Telehealth: Payer: Self-pay | Admitting: Neurology

## 2020-11-05 ENCOUNTER — Ambulatory Visit: Payer: 59 | Admitting: Neurology

## 2020-11-05 VITALS — BP 124/77 | HR 64 | Ht 73.0 in | Wt 291.5 lb

## 2020-11-05 DIAGNOSIS — Z9989 Dependence on other enabling machines and devices: Secondary | ICD-10-CM

## 2020-11-05 DIAGNOSIS — I471 Supraventricular tachycardia: Secondary | ICD-10-CM

## 2020-11-05 DIAGNOSIS — Q203 Discordant ventriculoarterial connection: Secondary | ICD-10-CM | POA: Diagnosis not present

## 2020-11-05 DIAGNOSIS — I5022 Chronic systolic (congestive) heart failure: Secondary | ICD-10-CM | POA: Diagnosis not present

## 2020-11-05 DIAGNOSIS — M10371 Gout due to renal impairment, right ankle and foot: Secondary | ICD-10-CM

## 2020-11-05 DIAGNOSIS — G5793 Unspecified mononeuropathy of bilateral lower limbs: Secondary | ICD-10-CM | POA: Diagnosis not present

## 2020-11-05 DIAGNOSIS — G4733 Obstructive sleep apnea (adult) (pediatric): Secondary | ICD-10-CM

## 2020-11-05 MED ORDER — FEBUXOSTAT 80 MG PO TABS
80.0000 mg | ORAL_TABLET | Freq: Every day | ORAL | 1 refills | Status: DC
Start: 2020-11-05 — End: 2021-05-25

## 2020-11-05 NOTE — Telephone Encounter (Signed)
Next Visit: 04/08/2021 Last Visit: 10/06/2020  Last Fill: 10/07/2020  DX: Chronic gouty arthritis   Current Dose per office note 10/06/2020: Take 1 tablet (80 mg total) by mouth daily.  Labs: 10/06/2020 uric acid: 7.6  Okay to refill Febuxostat?

## 2020-11-05 NOTE — Patient Instructions (Signed)
Peripheral Neuropathy ?Peripheral neuropathy is a type of nerve damage. It affects nerves that carry signals between the spinal cord and the arms, legs, and the rest of the body (peripheral nerves). It does not affect nerves in the spinal cord or brain. In peripheral neuropathy, one nerve or a group of nerves may be damaged. Peripheral neuropathy is a broad category that includes many specific nerve disorders, like diabetic neuropathy, hereditary neuropathy, and carpal tunnel syndrome. ?What are the causes? ?This condition may be caused by: ?Diabetes. This is the most common cause of peripheral neuropathy. ?Nerve injury. ?Pressure or stress on a nerve that lasts a long time. ?Lack (deficiency) of B vitamins. This can result from alcoholism, poor diet, or a restricted diet. ?Infections. ?Autoimmune diseases, such as rheumatoid arthritis and systemic lupus erythematosus. ?Nerve diseases that are passed from parent to child (inherited). ?Some medicines, such as cancer medicines (chemotherapy). ?Poisonous (toxic) substances, such as lead and mercury. ?Too little blood flowing to the legs. ?Kidney disease. ?Thyroid disease. ?In some cases, the cause of this condition is not known. ?What are the signs or symptoms? ?Symptoms of this condition depend on which of your nerves is damaged. Common symptoms include: ?Loss of feeling (numbness) in the feet, hands, or both. ?Tingling in the feet, hands, or both. ?Burning pain. ?Very sensitive skin. ?Weakness. ?Not being able to move a part of the body (paralysis). ?Muscle twitching. ?Clumsiness or poor coordination. ?Loss of balance. ?Not being able to control your bladder. ?Feeling dizzy. ?Sexual problems. ?How is this diagnosed? ?Diagnosing and finding the cause of peripheral neuropathy can be difficult. Your health care provider will take your medical history and do a physical exam. A neurological exam will also be done. This involves checking things that are affected by your  brain, spinal cord, and nerves (nervous system). For example, your health care provider will check your reflexes, how you move, and what you can feel. ?You may have other tests, such as: ?Blood tests. ?Electromyogram (EMG) and nerve conduction tests. These tests check nerve function and how well the nerves are controlling the muscles. ?Imaging tests, such as CT scans or MRI to rule out other causes of your symptoms. ?Removing a small piece of nerve to be examined in a lab (nerve biopsy). ?Removing and examining a small amount of the fluid that surrounds the brain and spinal cord (lumbar puncture). ?How is this treated? ?Treatment for this condition may involve: ?Treating the underlying cause of the neuropathy, such as diabetes, kidney disease, or vitamin deficiencies. ?Stopping medicines that can cause neuropathy, such as chemotherapy. ?Medicine to help relieve pain. Medicines may include: ?Prescription or over-the-counter pain medicine. ?Antiseizure medicine. ?Antidepressants. ?Pain-relieving patches that are applied to painful areas of skin. ?Surgery to relieve pressure on a nerve or to destroy a nerve that is causing pain. ?Physical therapy to help improve movement and balance. ?Devices to help you move around (assistive devices). ?Follow these instructions at home: ?Medicines ?Take over-the-counter and prescription medicines only as told by your health care provider. Do not take any other medicines without first asking your health care provider. ?Do not drive or use heavy machinery while taking prescription pain medicine. ?Lifestyle ? ?Do not use any products that contain nicotine or tobacco, such as cigarettes and e-cigarettes. Smoking keeps blood from reaching damaged nerves. If you need help quitting, ask your health care provider. ?Avoid or limit alcohol. Too much alcohol can cause a vitamin B deficiency, and vitamin B is needed for healthy nerves. ?Eat a   healthy diet. This includes: ?Eating foods that are  high in fiber, such as fresh fruits and vegetables, whole grains, and beans. ?Limiting foods that are high in fat and processed sugars, such as fried or sweet foods. ?General instructions ? ?If you have diabetes, work closely with your health care provider to keep your blood sugar under control. ?If you have numbness in your feet: ?Check every day for signs of injury or infection. Watch for redness, warmth, and swelling. ?Wear padded socks and comfortable shoes. These help protect your feet. ?Develop a good support system. Living with peripheral neuropathy can be stressful. Consider talking with a mental health specialist or joining a support group. ?Use assistive devices and attend physical therapy as told by your health care provider. This may include using a walker or a cane. ?Keep all follow-up visits as told by your health care provider. This is important. ?Contact a health care provider if: ?You have new signs or symptoms of peripheral neuropathy. ?You are struggling emotionally from dealing with peripheral neuropathy. ?Your pain is not well-controlled. ?Get help right away if: ?You have an injury or infection that is not healing normally. ?You develop new weakness in an arm or leg. ?You have fallen or do so frequently. ?Summary ?Peripheral neuropathy is when the nerves in the arms, or legs are damaged, resulting in numbness, weakness, or pain. ?There are many causes of peripheral neuropathy, including diabetes, pinched nerves, vitamin deficiencies, autoimmune disease, and hereditary conditions. ?Diagnosing and finding the cause of peripheral neuropathy can be difficult. Your health care provider will take your medical history, do a physical exam, and do tests, including blood tests and nerve function tests. ?Treatment involves treating the underlying cause of the neuropathy and taking medicines to help control pain. Physical therapy and assistive devices may also help. ?This information is not intended to  replace advice given to you by your health care provider. Make sure you discuss any questions you have with your health care provider. ?Document Revised: 12/04/2019 Document Reviewed: 12/04/2019 ?Elsevier Patient Education ? 2022 Elsevier Inc. ? ?

## 2020-11-05 NOTE — Telephone Encounter (Signed)
Ncv/emg r/s to 12/08/20

## 2020-11-05 NOTE — Progress Notes (Addendum)
SLEEP MEDICINE CLINIC   Provider:  Larey Seat, M D  Referring Provider: Jamse Arn,  MD at [facility] Physician:  Dr. Hilma Favors at [facility]     HPI: 11-05-2020,  Patient is here for CPAP compliance follow.  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  He had a cardioversion about 3 and to 4 years ago he had remained on current sinus rhythm.  His medications are updated below.  He has been on an auto titration ResMed machine.  His residual AHI in the past was always under 5 so very good control of his apnea.11/05/20 Here for yearly CPAP f/u. Pt would like a new CPAP, CPAP over 63 years old. Pt states he has to place it down below, water is sitting on the tubes and making noises.    100% compliance with a 90% compliance for over 4 hours the average use at time of use is 5 hours 46 minutes.  The minimum pressure 6 maximum pressure 16 cmH2O was 2 cm EPR residual AHI is 4.0.  His 95th percentile pressure is 12.7.  He does have some air leaking out of the full facemask and is not always possible to completely eliminate that especially if there is facial hair.  He may feel more comfortable with a smaller mask like a nasal mask we could definitely offer.  At this time I am happy with his apnea control.  The patient has stated that he was seen by Dr. Merlene Laughter for neuropathy and he would like to transfer his neuropathy care here.  This should not be a problem I will review the tests that were already done for him in order for this disease work-up the last labs I have in our epic system pertaining to this patient's possible neuropathy date from 2017 so they will be hardly appropriate to reference here today.   One problem is the gouty arthritis that also leads to pain.   ,Lyrica helps with some pain, tramadol as needed.  He felt Cymbalta  has not added benefit.    He advised me that a nephew May 15, 2022) died of Fentanyl Overdose. He is reluctant to take narcotics, understandable.     Component Ref Range & Units 1 mo ago  (10/06/20) 2 mo ago  (08/21/20) 3 mo ago  (07/15/20) 5 yr ago  (04/11/15) 5 yr ago  (04/10/15) 7 yr ago  (12/14/12) 12 yr ago  (08/19/08)  Glucose, Bld 65 - 99 mg/dL 100 High   101 High  CM  85 CM  113 High   148 High   96 R  91 R   Comment: .             Fasting reference interval  .  For someone without known diabetes, a glucose value  between 100 and 125 mg/dL is consistent with  prediabetes and should be confirmed with a  follow-up test.  .   BUN 7 - 25 mg/dL _0 R  13 R  16 R    Creat 0.70 - 1.30 mg/dL 1.35 High   1.25 R, CM  1.02 R, CM  1.22 R  1.38 High  R  1.00 R    eGFR > OR = 60 mL/min/1.18m 63         Comment: The eGFR  is based on the CKD-EPI 2021 equation. To calculate  the new eGFR from a previous Creatinine or Cystatin C  result, go to https://www.kidney.org/professionals/  kdoqi/gfr%5Fcalculator   BUN/Creatinine Ratio 6 - 22 (calc) 19  NOT APPLICABLE  NOT APPLICABLE       Sodium 135 - 146 mmol/L 143  136  140  143 R  139 R  139 R  140 R   Potassium 3.5 - 5.3 mmol/L 4.7  5.6 High   4.2  4.6 R  5.1 R  3.9 R  4.1 R   Chloride 98 - 110 mmol/L 103  102  105  107 R  105 R  105 R    CO2 20 - 32 mmol/L _0 R  23 R     Calcium 8.6 - 10.3 mg/dL 9.4  9.5  9.6  9.1 R  9.2 R     Total Protein 6.1 - 8.1 g/dL 6.7  6.7  7.2       Albumin 3.6 - 5.1 g/dL 4.2  4.2  4.2       Globulin 1.9 - 3.7 g/dL (calc) 2.5  2.5  3.0       AG Ratio 1.0 - 2.5 (calc) 1.7  1.7  1.4       Total Bilirubin 0.2 - 1.2 mg/dL 0.9  0.8  0.5       Alkaline phosphatase (APISO) 35 - 144 U/L 64  82  102       AST 10 - 35 U/L _1 ALT 9 - 46 U/L _2 Resulting Agency  [facility] [place] [facility] [place] [facility] [place] [facility] [place] [facility] [place] [facility]                   Willie Daniel is a 50 y.o. male , seen here as a revisit  from [facility] cardiology, Margretta Sidle,  Willie Daniel has undergone cardioversion for atrial flutter on 05-26-2017, under the guidance of Dr. Michaelle Birks. 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  He is a compliance CPAP user, and on a FFM, which sometimes bothers him.  Willie Daniel has recovered well from the cardioversion, his current rhythm is sinus. His medications include Lotensin, Lasix, indomethacin, Toprol 50 mg extended release form daily, dofetilide 250 mcg 2 times daily and baby aspirin 1 time a day. Willie Daniel is using a ResMed machine air sense 10 AutoSet function between 5 and 20 cmH2O 2 cm expiratory pressure relief, the 95th percentile pressure at night is 13.4 cmH2O it leaves him with a residual AHI of 3.2 most of the apneas that are still found out obstructive in nature, yet he has definitely enough blood pressure window to compensate.  The average use of time is 5 hours 93% compliance by days and 23 complaint percent compliance by over 4-hour compliance rules.  He sometimes falls asleep and forgets to put the CPAP on first, and he had to use a different machine from his own while being hospitalized at [facility].  I had the pleasure of seeing Willie Daniel today on 07/15/2016, he has been a compliant CPAP user for the last 28 out of 30 days and 87% compliance and an average user time of 5 hours and 12 minutes. His residual AHI is 3.0, which  also is an excellent result all residual apneas are obstructive in nature. His 95th percentile pressure is 12.4 cm water he does have occasionally high air leaks. He is using an AutoSet between 5 and 20 cm water with 2 cm expiratory pressure relief. Based on current numbers and the patient's report of low daytime sleepiness reflected in an Epworth sleepiness score of 3 points fatigue severity score of 24 points, then needs to be no adjustments made. I will follow him once a year  for CPAP compliance. I will send a letter to his [place] cardiologist. There is no local primary care physician to be informed. He has seen [facility] in [place].   Interval history from 07/17/2015, Willie Daniel is here today to follow-up on his recent sleep study dated 05/19/2015. He had indeed a shocking AHI of 89.8 and an RDI of 90.2. There was no supine exenteration or REM accentuation noted. He did not retain CO2. He had a total of 104 minutes of oxygen desaturation with a nadir of 58%. Based on these results the study was split for a CPAP titration. Pressures between 5 and 15 cm water were explored and the 15 cm water pressure achieved an AHI of 0.0. The patient developed some periodic limb movements but the arousals related to these were only 1.3 per hour. He was placed on an AutoPap between 5 and 20 cm water with a quadro fullface mask medium size. I'm able today to review his outer titration. The patient is compliant has used the machine for 27 out of 30 days and for 25 days over 4 hours. Total daily user time is 5-1/2 hours, residual AHI is 6.9 all residual apneas are obstructive. 91st percentile pressure is 11.3 he does have some significant air leaks. He has quite significant nasal congestion and is forced to mouth breathe which probably contributes to the airleak. The apnea index however at home has been reduced from a peak at around April 16 and the first 10 days of May show a significant reduction in AHI. He has tightened his face gear around May 1 and this may be reflected and lower AHI now, and  lower air leaks.  He reported FSS at 30, Epworth 5 . Chief complaint according to patient : " My wife stated I snore" , and "my sleep is less restoring and I get fatigued.  " Willie Daniel reports that he has a regular work hours works often long hours, and therefore has not been able to establish a routine sleep time. He has dextrocardia, pulmonic stenosis which is congenital is a transposition of  the great arteries, ventricular septal defect and was referred for the sleep study based on the concerns of his cardiologist. His systemic ventricle has been enlarged over the last 6 months and he has been woken by a rapid heart rate and palpitations was diagnosed with atrial flutter when he [facility] to [facility]. He was treated with Bonney Roussel was, diltiazem and underwent a DC CV under the guidance of Dr. Virl Axe, cardio- electro-physiologist. He remains on Naprosyn as needed, indomethacin with meals, has also hydrocodone for pain ordered as a when necessary medication, Coreg, Lotensin and the above-mentioned and appears. He resides near [facility] and the sleep study was more convenient to be performed here in [place] than at [place], [place]. He works as a Financial controller. Sleep medical history and family sleep history:  Parents are both deceased. Father when he was 68 , and his mother of  multiple myeloma about 6 years ago. Social history: ETOH- on week ends,  beer about 4-6. Caffeine - occasional Soda once or twice a week-, not coffee, rarely tea.  No tobaco use.    Review of Systems: Out of a complete 14 system review, the patient complains of only the following symptoms, and all other reviewed systems are negative. Snoring, dry mouth.The patient has louder heart beats on his right than on his left, due to his congenitally existing trans position of the great arteries right ventricle ejection fraction is over 50% left ejection fraction over 50% he is status post closure of a ventriculoseptal defect. Facial erythema.    Epworth score 3, Fatigue severity score  24  , depression score 0. N/a    Social History   Socioeconomic History   Marital status: Married    Spouse name: Kimberley "Maudie Mercury"   Number of children: 2   Years of education: 14   Highest education level: Not on file  Occupational History   Occupation: [employer]   Tobacco Use   Smoking status: Never    Smokeless tobacco: Never  Vaping Use   Vaping Use: Never used  Substance and Sexual Activity   Alcohol use: Not Currently   Drug use: No   Sexual activity: Not on file  Other Topics Concern   Not on file  Social History Narrative   Lives at home with wife and kids   Caffeine use: Tea/soda occass (2-3 per week)   Social Determinants of Health   Financial Resource Strain: Not on file  Food Insecurity: Not on file  Transportation Needs: Not on file  Physical Activity: Not on file  Stress: Not on file  Social Connections: Not on file  Intimate Partner Violence: Not on file    Family History  Problem Relation Age of Onset   Multiple myeloma Mother    Healthy Brother    Healthy Brother    Healthy Daughter    Healthy Daughter    Healthy Son     Past Medical History:  Diagnosis Date   Hypertension    Neuropathy    OSA on CPAP 07/17/2015   Osteoarthritis     Past Surgical History:  Procedure Laterality Date   CARDIAC SURGERY  1976   age 63 and age 69 at [place] 04/14/2015   Procedure: Cardioversion;  Surgeon: Deboraha Sprang, MD;  Location: [facility];  Service: Cardiovascular;  Laterality: N/A;   I & D EXTREMITY  2004   right leg infection   KNEE ARTHROSCOPY WITH MEDIAL MENISECTOMY Left 12/14/2012   Procedure: KNEE ARTHROSCOPY WITH PARTIAL MEDIAL AND LATERAL MENISECTOMY EXTENSIVE SYNOVECTOMY REMOVAL NODULE LEFT KNEE;  Surgeon: Ninetta Lights, MD;  Location: [facility];  Service: Orthopedics;  Laterality: Left;   TEE WITHOUT CARDIOVERSION N/A 04/14/2015   Procedure: TRANSESOPHAGEAL ECHOCARDIOGRAM (TEE);  Surgeon: Josue Hector, MD;  Location: [facility];  Service: Cardiovascular;  Laterality: N/A;   VASECTOMY  2010   general anesth    Current Outpatient Medications  Medication Sig Dispense Refill   apixaban (ELIQUIS) 5 MG TABS tablet Take by mouth 2 (two) times daily.      aspirin EC 81 MG tablet Take by mouth.     b  complex vitamins capsule Take 1 capsule by mouth daily.     colchicine 0.6 MG tablet Take 1 tablet (0.6 mg total) by mouth as needed. 30 tablet 2   diclofenac Sodium (  VOLTAREN) 1 % GEL Apply topically as needed.     dofetilide (TIKOSYN) 250 MCG capsule Take 250 mcg by mouth 2 (two) times daily.     DULoxetine (CYMBALTA) 60 MG capsule Take 60 mg by mouth daily.     Febuxostat 80 MG TABS Take 1 tablet (80 mg total) by mouth daily. 90 tablet 1   levothyroxine (SYNTHROID) 50 MCG tablet Take 50 mcg by mouth daily.     metoprolol succinate (TOPROL-XL) 50 MG 24 hr tablet Take 50 mg by mouth daily.  0   olmesartan (BENICAR) 20 MG tablet Take 20 mg by mouth daily.     predniSONE (DELTASONE) 10 MG tablet Take 1 tablet (10 mg total) by mouth daily as needed. 30 tablet 0   pregabalin (LYRICA) 150 MG capsule Take 150 mg by mouth 2 (two) times daily.     rOPINIRole (REQUIP) 0.25 MG tablet Take by mouth.     torsemide (DEMADEX) 20 MG tablet Take 20 mg by mouth daily.     traMADol (ULTRAM) 50 MG tablet as needed.     traMADol (ULTRAM) 50 MG tablet tramadol 50 mg tablet  Take 1 tablet every day by oral route as needed.     VITAMIN D PO Take by mouth.     potassium chloride SA (K-DUR) 20 MEQ tablet Take by mouth as needed.     No current facility-administered medications for this visit.    Allergies as of 11/05/2020   (No Known Allergies)    Vitals: BP 124/77   Pulse 64   Ht _0  (1.854 m)   Wt 291 lb 8 oz (132.2 kg)   BMI 38.46 kg/m  Last Weight:  Wt Readings from Last 1 Encounters:  11/05/20 291 lb 8 oz (132.2 kg)   WNU:UVOZ mass index is 38.46 kg/m.     Last Height:   Ht Readings from Last 1 Encounters:  11/05/20 _1  (1.854 m)    Physical exam:  General: The patient is awake, alert and appears not in acute distress. The patient is well groomed. Head: Normocephalic, atraumatic. Neck is supple. Mallampati 4,  neck circumference:19.5. Nasal airflow unrestricted - rhinitis, TMJ is not  evident . Retrognathia is not seen.  Cranial nerves, intact smell and taste , normal facial strength and sensation, no visual field changes - he is now using corrective lenses.   Cardiovascular:  Regular rate and rhythm ,  without distended neck veins.  The patient has louder heart beats on his right than on his left, due to his congenitally existing trans position of the great arteries right ventricle ejection fraction is over 50% left ejection fraction over 50% he is status post closure of a ventriculoseptal defect. Respiratory: Lungs are clear to auscultation. Skin:  With ankle edema, and gout.  Trunk: BMI is elevated. The patient's posture is erect   Neurologic exam : The patient is awake and alert, oriented to place and time.    Cranial nerves: Pupils are equal and briskly reactive to light.  Visual fields by finger perimetry are intact. Hearing to finger rub intact.   Facial sensation intact to fine touch.  Facial motor strength is symmetric and tongue and uvula move midline. Shoulder shrug was symmetrical.     The patient was advised of the nature of the diagnosed sleep disorder , the treatment options and risks for general a health and wellness arising from not treating the condition.  I spent more than 29mnutes of face to face  time with the patient. Greater than 50% of time was spent in counseling and coordination of care. We have discussed the diagnosis and differential and I answered the patient's questions.     Assessment:  After physical and neurologic examination, review of laboratory studies,  Personal review of imaging studies, reports of other /same  Imaging studies ,  Results of polysomnography/ neurophysiology testing and pre-existing records as far as provided in visit., my assessment is :  Willie Daniel had one more episode of documented atrial flutter, and has a highly unusual cardiovascular anatomy with transposition of the great vessels.  Palpitations have occurred  but has only woke him out of sleep once. The last one, 05-26-2017- produced palpitations at 9 PM and lasted 16 hours, he consulted his cardiologist by phone- and was asked to come to ED, [place]-  He was seen at [facility] ,admitted, and had a cardioversion. Medication rate control failed.   The ventriculoseptal defect, dextrocardia congenital pulmonary stenosis all contribute to a higher risk of obstructive sleep apnea in the context of obesity and witnessed snoring and apneas per spouse.   1) OSA-Mr. Bluitt was diagnosed with a severe form of apnea at an AHI of 89.8/h,  RDI of 90.2/h, and  hypoxemia for over 100 minutes in the diagnostic part of the study only. Also, oxygen nadir was 58% Patient with high cardiovascular risk of OSA- Status of transposition of the great arteries.  Compliant CPAP user. Again flutter attack.  He has begun using CPAP auto titration with a full face mask his AHI is reduced to 3.0 with FFM.  5-20 cm water . I will reduce to 16 cm water, and  2 cm EPR. Offer a refitting for less bulky type of a mask.   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   Plan:  Treatment plan and additional workup :  I will ask Mr. Misenheimer to c repeat a HST for new CPAP autotitration device.   I have reviewed the complete metabolic panel panel and a CBC with differential and also a recent rheumatological work-up with Dr. Benjamine Mola.  I think his neuropathy is multifocal so there is definitely a possibility that, chronic inflammation markers associated with gout have caused some of this.  And the patient has now mostly a pain syndrome not a numbness problem.  It is both.  He needs a copy of EMG NCV from Dr. Merlene Laughter, which is not in epic. Need repeat if older than 12 month.      Rv in 12 month.     Asencion Partridge Lonzell Dorris MD  11/05/2020

## 2020-11-20 ENCOUNTER — Encounter: Payer: 59 | Admitting: Neurology

## 2020-11-24 ENCOUNTER — Other Ambulatory Visit: Payer: Self-pay | Admitting: *Deleted

## 2020-11-24 DIAGNOSIS — M1A00X0 Idiopathic chronic gout, unspecified site, without tophus (tophi): Secondary | ICD-10-CM

## 2020-11-25 LAB — URIC ACID: Uric Acid, Serum: 4.6 mg/dL (ref 4.0–8.0)

## 2020-11-26 NOTE — Progress Notes (Signed)
Uric acid is now 4.6 with the higher dose uloric, which is well controlled. Let's continue on with this dose, and he can let us know if severe flares come back again in the meantime.

## 2020-12-08 ENCOUNTER — Ambulatory Visit (INDEPENDENT_AMBULATORY_CARE_PROVIDER_SITE_OTHER): Payer: 59 | Admitting: Neurology

## 2020-12-08 ENCOUNTER — Ambulatory Visit: Payer: 59 | Admitting: Neurology

## 2020-12-08 DIAGNOSIS — I471 Supraventricular tachycardia: Secondary | ICD-10-CM | POA: Diagnosis not present

## 2020-12-08 DIAGNOSIS — Z9989 Dependence on other enabling machines and devices: Secondary | ICD-10-CM

## 2020-12-08 DIAGNOSIS — G5793 Unspecified mononeuropathy of bilateral lower limbs: Secondary | ICD-10-CM | POA: Diagnosis not present

## 2020-12-08 DIAGNOSIS — Z0289 Encounter for other administrative examinations: Secondary | ICD-10-CM

## 2020-12-08 DIAGNOSIS — M10371 Gout due to renal impairment, right ankle and foot: Secondary | ICD-10-CM

## 2020-12-08 DIAGNOSIS — G609 Hereditary and idiopathic neuropathy, unspecified: Secondary | ICD-10-CM

## 2020-12-08 DIAGNOSIS — I5022 Chronic systolic (congestive) heart failure: Secondary | ICD-10-CM | POA: Diagnosis not present

## 2020-12-08 DIAGNOSIS — Q203 Discordant ventriculoarterial connection: Secondary | ICD-10-CM | POA: Diagnosis not present

## 2020-12-08 DIAGNOSIS — G4733 Obstructive sleep apnea (adult) (pediatric): Secondary | ICD-10-CM

## 2020-12-08 NOTE — Patient Instructions (Signed)
Peripheral Neuropathy ?Peripheral neuropathy is a type of nerve damage. It affects nerves that carry signals between the spinal cord and the arms, legs, and the rest of the body (peripheral nerves). It does not affect nerves in the spinal cord or brain. In peripheral neuropathy, one nerve or a group of nerves may be damaged. Peripheral neuropathy is a broad category that includes many specific nerve disorders, like diabetic neuropathy, hereditary neuropathy, and carpal tunnel syndrome. ?What are the causes? ?This condition may be caused by: ?Diabetes. This is the most common cause of peripheral neuropathy. ?Nerve injury. ?Pressure or stress on a nerve that lasts a long time. ?Lack (deficiency) of B vitamins. This can result from alcoholism, poor diet, or a restricted diet. ?Infections. ?Autoimmune diseases, such as rheumatoid arthritis and systemic lupus erythematosus. ?Nerve diseases that are passed from parent to child (inherited). ?Some medicines, such as cancer medicines (chemotherapy). ?Poisonous (toxic) substances, such as lead and mercury. ?Too little blood flowing to the legs. ?Kidney disease. ?Thyroid disease. ?In some cases, the cause of this condition is not known. ?What are the signs or symptoms? ?Symptoms of this condition depend on which of your nerves is damaged. Common symptoms include: ?Loss of feeling (numbness) in the feet, hands, or both. ?Tingling in the feet, hands, or both. ?Burning pain. ?Very sensitive skin. ?Weakness. ?Not being able to move a part of the body (paralysis). ?Muscle twitching. ?Clumsiness or poor coordination. ?Loss of balance. ?Not being able to control your bladder. ?Feeling dizzy. ?Sexual problems. ?How is this diagnosed? ?Diagnosing and finding the cause of peripheral neuropathy can be difficult. Your health care provider will take your medical history and do a physical exam. A neurological exam will also be done. This involves checking things that are affected by your  brain, spinal cord, and nerves (nervous system). For example, your health care provider will check your reflexes, how you move, and what you can feel. ?You may have other tests, such as: ?Blood tests. ?Electromyogram (EMG) and nerve conduction tests. These tests check nerve function and how well the nerves are controlling the muscles. ?Imaging tests, such as CT scans or MRI to rule out other causes of your symptoms. ?Removing a small piece of nerve to be examined in a lab (nerve biopsy). ?Removing and examining a small amount of the fluid that surrounds the brain and spinal cord (lumbar puncture). ?How is this treated? ?Treatment for this condition may involve: ?Treating the underlying cause of the neuropathy, such as diabetes, kidney disease, or vitamin deficiencies. ?Stopping medicines that can cause neuropathy, such as chemotherapy. ?Medicine to help relieve pain. Medicines may include: ?Prescription or over-the-counter pain medicine. ?Antiseizure medicine. ?Antidepressants. ?Pain-relieving patches that are applied to painful areas of skin. ?Surgery to relieve pressure on a nerve or to destroy a nerve that is causing pain. ?Physical therapy to help improve movement and balance. ?Devices to help you move around (assistive devices). ?Follow these instructions at home: ?Medicines ?Take over-the-counter and prescription medicines only as told by your health care provider. Do not take any other medicines without first asking your health care provider. ?Do not drive or use heavy machinery while taking prescription pain medicine. ?Lifestyle ? ?Do not use any products that contain nicotine or tobacco, such as cigarettes and e-cigarettes. Smoking keeps blood from reaching damaged nerves. If you need help quitting, ask your health care provider. ?Avoid or limit alcohol. Too much alcohol can cause a vitamin B deficiency, and vitamin B is needed for healthy nerves. ?Eat a   healthy diet. This includes: ?Eating foods that are  high in fiber, such as fresh fruits and vegetables, whole grains, and beans. ?Limiting foods that are high in fat and processed sugars, such as fried or sweet foods. ?General instructions ? ?If you have diabetes, work closely with your health care provider to keep your blood sugar under control. ?If you have numbness in your feet: ?Check every day for signs of injury or infection. Watch for redness, warmth, and swelling. ?Wear padded socks and comfortable shoes. These help protect your feet. ?Develop a good support system. Living with peripheral neuropathy can be stressful. Consider talking with a mental health specialist or joining a support group. ?Use assistive devices and attend physical therapy as told by your health care provider. This may include using a walker or a cane. ?Keep all follow-up visits as told by your health care provider. This is important. ?Contact a health care provider if: ?You have new signs or symptoms of peripheral neuropathy. ?You are struggling emotionally from dealing with peripheral neuropathy. ?Your pain is not well-controlled. ?Get help right away if: ?You have an injury or infection that is not healing normally. ?You develop new weakness in an arm or leg. ?You have fallen or do so frequently. ?Summary ?Peripheral neuropathy is when the nerves in the arms, or legs are damaged, resulting in numbness, weakness, or pain. ?There are many causes of peripheral neuropathy, including diabetes, pinched nerves, vitamin deficiencies, autoimmune disease, and hereditary conditions. ?Diagnosing and finding the cause of peripheral neuropathy can be difficult. Your health care provider will take your medical history, do a physical exam, and do tests, including blood tests and nerve function tests. ?Treatment involves treating the underlying cause of the neuropathy and taking medicines to help control pain. Physical therapy and assistive devices may also help. ?This information is not intended to  replace advice given to you by your health care provider. Make sure you discuss any questions you have with your health care provider. ?Document Revised: 12/04/2019 Document Reviewed: 12/04/2019 ?Elsevier Patient Education ? 2022 Elsevier Inc. ? ?

## 2020-12-10 NOTE — Progress Notes (Signed)
See procedure note.

## 2020-12-10 NOTE — Progress Notes (Signed)
Full Name: Willie Daniel Gender: Male MRN #: [id number] Date of Birth: June 28, 1970    Visit Date: 12/08/2020 12:22 Age: 50 Years Examining Physician: Naomie Dean, MD  Referring Physician: Melvyn Novas, MD Height: 6 feet 1 inch 291lbs Patient History: Chronic low back pain and numbness and tingling in the distal lower extremities.  Summary: Nerve conduction studies were performed on the bilateral lower extremities.  The right tibial motor nerve showed reduced amplitude (2.4 mV, normal greater than 4).  The left and right sural sensory nerve showed reduced amplitude (4 V, normal greater than 6).  The left and right superficial peroneal sensory nerve showed no response.  The right tibial F wave showed delayed latency (60.7 ms, normal less than 56). All remaining nerves(as indicated in the following tables) were within normal limits.  All muscles (as indicated in the following tables) were within normal limits.     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  There is also a suggestion of remote right L5-S1 radiculopathy.   Naomie Dean, M.D.  [facility] [address], [address] [place], [place] [address] Tel: [phone] Fax: [phone]  Verbal informed consent was obtained from the patient, patient was informed of potential risk of procedure, including bruising, bleeding, hematoma formation, infection, muscle weakness, muscle pain, numbness, among others.        MNC    Nerve / Sites Muscle Latency Ref. Amplitude Ref. Rel Amp Segments Distance Velocity Ref. Area    ms ms mV mV %  cm m/s m/s mVms  L Peroneal - EDB     Ankle EDB 4.3 ?6.5 5.2 ?2.0 100 Ankle - EDB 9   14.6     Fib head EDB 10.9  4.5  86.6 Fib head - Ankle 30 45 ?44 14.7     Pop fossa EDB 13.1  4.7  103 Pop fossa - Fib head 10 45 ?44 15.9         Pop fossa - Ankle      R Peroneal - EDB     Ankle EDB 4.8  ?6.5 3.1 ?2.0 100 Ankle - EDB 9   10.4     Fib head EDB 12.3  2.6  82.4 Fib head - Ankle 33 44 ?44 11.6     Pop fossa EDB 14.5  2.6  101 Pop fossa - Fib head 10 44 ?44 10.4         Pop fossa - Ankle      L Tibial - AH     Ankle AH 3.5 ?5.8 7.1 ?4.0 100 Ankle - AH 9   16.3     Pop fossa AH 13.6  5.3  74.2 Pop fossa - Ankle 42 41 ?41 16.3  R Tibial - AH     Ankle AH 3.8 ?5.8 2.4 ?4.0 100 Ankle - AH 9   10.2     Pop fossa AH 14.3  2.0  79.7 Pop fossa - Ankle 43 41 ?41 6.9             SNC    Nerve / Sites Rec. Site Peak Lat Ref.  Amp Ref. Segments Distance    ms ms V V  cm  L Sural - Ankle (Calf)     Calf Ankle 3.5 ?4.4 4 ?6 Calf - Ankle 14  R Sural - Ankle (Calf)     Calf Ankle 3.6 ?4.4 4 ?6 Calf -  Ankle 14  L Superficial peroneal - Ankle     Lat leg Ankle NR ?4.4 NR ?6 Lat leg - Ankle 14  R Superficial peroneal - Ankle     Lat leg Ankle NR ?4.4 NR ?6 Lat leg - Ankle 14             F  Wave    Nerve F Lat Ref.   ms ms  L Tibial - AH 55.4 ?56.0  R Tibial - AH 60.7 ?56.0         EMG Summary Table    Spontaneous MUAP Recruitment  Muscle IA Fib PSW Fasc Other Amp Dur. Poly Pattern  R. Vastus medialis Normal None None None _______ Normal Normal Normal Normal  R. Tibialis anterior Normal None None None _______ Normal Normal Normal Normal  R. Gastrocnemius (Medial head) Normal None None None _______ Normal Normal Normal Normal  R. Extensor hallucis longus Normal None None None _______ Normal Normal Normal Normal  R. Abductor hallucis Normal None None None _______ Normal Normal Normal Normal  R. Biceps femoris (long head) Normal None None None _______ Normal Normal Normal Normal  R. Gluteus maximus Normal None None None _______ Normal Normal Normal Normal  R. Gluteus medius Normal None None None _______ Normal Normal Normal Normal  R. Lumbar paraspinals (low) Normal None None None _______ Normal Normal Normal Normal

## 2020-12-10 NOTE — Progress Notes (Addendum)
I had a brief meeting with the patient after his EMG nerve conduction study today.  I performed nerve conduction studies on the lower extremities and an EMG nerve conduction study on the right lower extremities.  Patient's symptoms include symmetrical sensory changes such as numbness and tingling in the distal bilateral lower extremities and also chronic low back pain.  I explained to patient that his studies showed a distal axonal sensory polyneuropathy in the lower extremities and possibly remote L5-S1 radiculopathy.  He did endorse sensory changes in the feet and a distal gradient fashion and concomitant chronic low back pain with some radiation into the buttocks consistent with findings of testing.  I did have a long discussion with this patient about sensory polyneuropathy and the different causes.  From a brief review of his chart, he has prediabetes which is his main risk factor but he also has central obesity which I discussed with him is also now identified as an independent risk factor for polyneuropathy as well as chronic kidney disease.  I discussed with patient that I was happy to touch base with Dr. Vickey Huger and if she approved I could order a follow-up serum neuropathy panel for other causes, if any, of his distal mild sensory polyneuropathy.  I also explained that despite being electrographically mild in nature I do understand that his sensory symptoms in his feet can be very painful and that these often do not correlate as far as patient's symptoms are concerned. I have reached out to Dr. Vickey Huger for a response.   I spent 15 minutes of face-to-face and non-face-to-face time with patient on the  Neuropathic pain of legs diagnosis.  This included previsit chart review, lab review, study review, order entry, electronic health record documentation, patient education on the different diagnostic and therapeutic options, counseling and coordination of care, risks and benefits of management,  compliance, or risk factor reduction. This does not include time spent on emg/ncs.

## 2020-12-10 NOTE — Progress Notes (Signed)
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  There is also a suggestion of remote right L5-S1 radiculopathy.   Naomie Dean, M.D.

## 2020-12-10 NOTE — Procedures (Signed)
Full Name: Willie Daniel Gender: Male MRN #: [id number] Date of Birth: 1970/06/09    Visit Date: 12/08/2020 12:22 Age: 50 Years Examining Physician: Naomie Dean, MD  Referring Physician: Melvyn Novas, MD Height: 6 feet 1 inch 291lbs Patient History: Chronic low back pain and numbness and tingling in the distal lower extremities.  Summary: Nerve conduction studies were performed on the bilateral lower extremities.  The right tibial motor nerve showed reduced amplitude (2.4 mV, normal greater than 4).  The left and right sural sensory nerve showed reduced amplitude (4 V, normal greater than 6).  The left and right superficial peroneal sensory nerve showed no response.  The right tibial F wave showed delayed latency (60.7 ms, normal less than 56). All remaining nerves(as indicated in the following tables) were within normal limits.  All muscles (as indicated in the following tables) were within normal limits.     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  There is also a suggestion of remote right L5-S1 radiculopathy.   Naomie Dean, M.D.  [facility] [address] [place], [place] [address] Tel: [phone] Fax: [phone]  Verbal informed consent was obtained from the patient, patient was informed of potential risk of procedure, including bruising, bleeding, hematoma formation, infection, muscle weakness, muscle pain, numbness, among others.        MNC    Nerve / Sites Muscle Latency Ref. Amplitude Ref. Rel Amp Segments Distance Velocity Ref. Area    ms ms mV mV %  cm m/s m/s mVms  L Peroneal - EDB     Ankle EDB 4.3 ?6.5 5.2 ?2.0 100 Ankle - EDB 9   14.6     Fib head EDB 10.9  4.5  86.6 Fib head - Ankle 30 45 ?44 14.7     Pop fossa EDB 13.1  4.7  103 Pop fossa - Fib head 10 45 ?44 15.9         Pop fossa - Ankle      R Peroneal - EDB     Ankle EDB 4.8  ?6.5 3.1 ?2.0 100 Ankle - EDB 9   10.4     Fib head EDB 12.3  2.6  82.4 Fib head - Ankle 33 44 ?44 11.6     Pop fossa EDB 14.5  2.6  101 Pop fossa - Fib head 10 44 ?44 10.4         Pop fossa - Ankle      L Tibial - AH     Ankle AH 3.5 ?5.8 7.1 ?4.0 100 Ankle - AH 9   16.3     Pop fossa AH 13.6  5.3  74.2 Pop fossa - Ankle 42 41 ?41 16.3  R Tibial - AH     Ankle AH 3.8 ?5.8 2.4 ?4.0 100 Ankle - AH 9   10.2     Pop fossa AH 14.3  2.0  79.7 Pop fossa - Ankle 43 41 ?41 6.9             SNC    Nerve / Sites Rec. Site Peak Lat Ref.  Amp Ref. Segments Distance    ms ms V V  cm  L Sural - Ankle (Calf)     Calf Ankle 3.5 ?4.4 4 ?6 Calf - Ankle 14  R Sural - Ankle (Calf)     Calf Ankle 3.6 ?4.4 4 ?6 Calf -  Ankle 14  L Superficial peroneal - Ankle     Lat leg Ankle NR ?4.4 NR ?6 Lat leg - Ankle 14  R Superficial peroneal - Ankle     Lat leg Ankle NR ?4.4 NR ?6 Lat leg - Ankle 14             F  Wave    Nerve F Lat Ref.   ms ms  L Tibial - AH 55.4 ?56.0  R Tibial - AH 60.7 ?56.0         EMG Summary Table    Spontaneous MUAP Recruitment  Muscle IA Fib PSW Fasc Other Amp Dur. Poly Pattern  R. Vastus medialis Normal None None None _______ Normal Normal Normal Normal  R. Tibialis anterior Normal None None None _______ Normal Normal Normal Normal  R. Gastrocnemius (Medial head) Normal None None None _______ Normal Normal Normal Normal  R. Extensor hallucis longus Normal None None None _______ Normal Normal Normal Normal  R. Abductor hallucis Normal None None None _______ Normal Normal Normal Normal  R. Biceps femoris (long head) Normal None None None _______ Normal Normal Normal Normal  R. Gluteus maximus Normal None None None _______ Normal Normal Normal Normal  R. Gluteus medius Normal None None None _______ Normal Normal Normal Normal  R. Lumbar paraspinals (low) Normal None None None _______ Normal Normal Normal Normal

## 2020-12-11 ENCOUNTER — Encounter: Payer: Self-pay | Admitting: Neurology

## 2020-12-15 ENCOUNTER — Ambulatory Visit: Payer: 59 | Admitting: Neurology

## 2020-12-15 ENCOUNTER — Encounter: Payer: Self-pay | Admitting: Neurology

## 2020-12-15 VITALS — BP 115/77 | HR 73 | Ht 73.0 in | Wt 298.5 lb

## 2020-12-15 DIAGNOSIS — I471 Supraventricular tachycardia: Secondary | ICD-10-CM

## 2020-12-15 DIAGNOSIS — G5793 Unspecified mononeuropathy of bilateral lower limbs: Secondary | ICD-10-CM | POA: Diagnosis not present

## 2020-12-15 DIAGNOSIS — G4733 Obstructive sleep apnea (adult) (pediatric): Secondary | ICD-10-CM | POA: Diagnosis not present

## 2020-12-15 DIAGNOSIS — Z9989 Dependence on other enabling machines and devices: Secondary | ICD-10-CM

## 2020-12-15 DIAGNOSIS — E669 Obesity, unspecified: Secondary | ICD-10-CM

## 2020-12-15 DIAGNOSIS — I4892 Unspecified atrial flutter: Secondary | ICD-10-CM | POA: Diagnosis not present

## 2020-12-15 DIAGNOSIS — G609 Hereditary and idiopathic neuropathy, unspecified: Secondary | ICD-10-CM

## 2020-12-15 NOTE — Progress Notes (Signed)
SLEEP MEDICINE CLINIC   Provider:  Larey Seat, M D  Referring Provider: Jamse Arn,  MD at [facility] Physician:  Dr. Hilma Favors at [facility]     10-10-2022Rm 10, alone. Here to f/u post nerve conduction study results. Pt experienced dizziness and inability to walk today in office, due to getting up to fast. Pt reports feeling fine now. 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 Creepy  crawly sensation   There is also a suggestion of remote right L5-S1 radiculopathy. He reports no shooting pain, just numbness from below the knee to the toes, bilaterally- he scratched his leg during yard work and did not even feel the cut not the bleeding. He is anticoagulated.  I will d/c the Requip and see if this makes him less sleepy. CPAP -we discussed the nerve conduction study results above which was just performed on 10-3 22.  There were no muscle abnormalities noted so no weakness was verified but we see clearly a pattern of neuropathy.  He continues to use CPAP compliantly every night and has had a good resolution of his apnea.  But he does report that he feels recently more often sleepier and maybe has even microsleep attacks, another concern that Requip may have provoked some of these. Lyrica also can provoke this.      HPI: 11-05-2020,  Patient is here for CPAP compliance follow.  Mr. 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  He had a cardioversion about 3 and to 4 years ago he had remained on current sinus rhythm.  His medications are updated below.  He has been on an auto titration ResMed machine.  His residual AHI in the past was always under 5 so very good control of his apnea.12/15/20 Here for yearly CPAP f/u. Pt would like a new CPAP, CPAP over 3 years old. Pt states he has to  place it down below, water is sitting on the tubes and making noises.    100% compliance with a 90% compliance for over 4 hours the average use at time of use is 5 hours 46 minutes.  The minimum pressure 6 maximum pressure 16 cmH2O was 2 cm EPR residual AHI is 4.0.  His 95th percentile pressure is 12.7.  He does have some air leaking out of the full facemask and is not always possible to completely eliminate that especially if there is facial hair.  He may feel more comfortable with a smaller mask like a nasal mask we could definitely offer.  At this time I am happy with his apnea control.  The patient has stated that he was seen by Dr. Merlene Laughter for neuropathy and he would like to transfer his neuropathy care here.  This should not be a problem I will review the tests that were already done for him in order for this disease work-up the last labs I have in our epic system pertaining to this patient's possible neuropathy date from 2017 so they will be hardly appropriate to reference here today.   One problem is the gouty arthritis that also leads to pain.   ,Lyrica helps with some pain, tramadol as needed.  He felt Cymbalta  has not added benefit.   He advised me that a nephew Jun 05, 2022) died of Fentanyl Overdose. He is reluctant to  take narcotics, understandable.     Component Ref Range & Units 1 mo ago  (10/06/20) 2 mo ago  (08/21/20) 3 mo ago  (07/15/20) 5 yr ago  (04/11/15) 5 yr ago  (04/10/15) 7 yr ago  (12/14/12) 12 yr ago  (08/19/08)  Glucose, Bld 65 - 99 mg/dL 100 High   101 High  CM  85 CM  113 High   148 High   96 R  91 R   Comment: .             Fasting reference interval  .  For someone without known diabetes, a glucose value  between 100 and 125 mg/dL is consistent with  prediabetes and should be confirmed with a  follow-up test.  .   BUN 7 - 25 mg/dL '25  24  22  10 ' R  13 R  16 R    Creat 0.70 - 1.30 mg/dL 1.35 High   1.25 R, CM  1.02 R, CM  1.22 R  1.38 High  R  1.00 R    eGFR > OR = 60  mL/min/1.3m 63         Comment: The eGFR is based on the CKD-EPI 2021 equation. To calculate  the new eGFR from a previous Creatinine or Cystatin C  result, go to https://www.kidney.org/professionals/  kdoqi/gfr%5Fcalculator   BUN/Creatinine Ratio 6 - 22 (calc) 19  NOT APPLICABLE  NOT APPLICABLE       Sodium 135 - 146 mmol/L 143  136  140  143 R  139 R  139 R  140 R   Potassium 3.5 - 5.3 mmol/L 4.7  5.6 High   4.2  4.6 R  5.1 R  3.9 R  4.1 R   Chloride 98 - 110 mmol/L 103  102  105  107 R  105 R  105 R    CO2 20 - 32 mmol/L '31  30  24  28 ' R  23 R     Calcium 8.6 - 10.3 mg/dL 9.4  9.5  9.6  9.1 R  9.2 R     Total Protein 6.1 - 8.1 g/dL 6.7  6.7  7.2       Albumin 3.6 - 5.1 g/dL 4.2  4.2  4.2       Globulin 1.9 - 3.7 g/dL (calc) 2.5  2.5  3.0       AG Ratio 1.0 - 2.5 (calc) 1.7  1.7  1.4       Total Bilirubin 0.2 - 1.2 mg/dL 0.9  0.8  0.5       Alkaline phosphatase (APISO) 35 - 144 U/L 64  82  102       AST 10 - 35 U/L '20  13  19       ' ALT 9 - 46 U/L '20  16  19       ' Resulting Agency  [facility] [place] [facility] [place] [facility] [place] [facility]                  SBOBY EYERis a 50y.o. male , [facility]  Mr. FSmolinskyhas undergone cardioversion for atrial flutter on 05-26-2017, under the guidance of Dr. BMichaelle Birks He has been born with  transposition of the great arteries.  He is a compliance CPAP user, and on a FFM, which sometimes  bothers him.  Mr. Snelgrove has recovered well from the cardioversion, his current rhythm is sinus. His medications include Lotensin, Lasix, indomethacin, Toprol 50 mg extended release form daily, dofetilide 250 mcg 2 times daily and baby aspirin 1 time a day. Mr. Molner is using a ResMed machine air sense 10 AutoSet function between 5 and 20 cmH2O 2 cm expiratory pressure relief, the 95th percentile pressure at night is 13.4 cmH2O  it leaves him with a residual AHI of 3.2 most of the apneas that are still found out obstructive in nature, yet he has definitely enough blood pressure window to compensate.  The average use of time is 5 hours 93% compliance by days and 23 complaint percent compliance by over 4-hour compliance rules.  He sometimes falls asleep and forgets to put the CPAP on first, and he had to use a different machine from his own while being hospitalized at [facility].  I had the pleasure of seeing Mr. Embry today on 07/15/2016, he has been a compliant CPAP user for the last 28 out of 30 days and 87% compliance and an average user time of 5 hours and 12 minutes. His residual AHI is 3.0, which also is an excellent result all residual apneas are obstructive in nature. His 95th percentile pressure is 12.4 cm water he does have occasionally high air leaks. He is using an AutoSet between 5 and 20 cm water with 2 cm expiratory pressure relief. Based on current numbers and the patient's report of low daytime sleepiness reflected in an Epworth sleepiness score of 3 points fatigue severity score of 24 points, then needs to be no adjustments made. I will follow him once a year for CPAP compliance. I will send a letter to his [place] cardiologist. There is no local primary care physician to be informed. He has seen [facility] in [place].   Interval history from 07/17/2015, Mr. Abdalla is here today to follow-up on his recent sleep study dated 05/19/2015. He had indeed a shocking AHI of 89.8 and an RDI of 90.2. There was no supine exenteration or REM accentuation noted. He did not retain CO2. He had a total of 104 minutes of oxygen desaturation with a nadir of 58%. Based on these results the study was split for a CPAP titration. Pressures between 5 and 15 cm water were explored and the 15 cm water pressure achieved an AHI of 0.0. The patient developed some periodic limb movements but the arousals related to these were only 1.3 per hour. He  was placed on an AutoPap between 5 and 20 cm water with a quadro fullface mask medium size. I'm able today to review his outer titration. The patient is compliant has used the machine for 27 out of 30 days and for 25 days over 4 hours. Total daily user time is 5-1/2 hours, residual AHI is 6.9 all residual apneas are obstructive. 91st percentile pressure is 11.3 he does have some significant air leaks. He has quite significant nasal congestion and is forced to mouth breathe which probably contributes to the airleak. The apnea index however at home has been reduced from a peak at around April 16 and the first 10 days of May show a significant reduction in AHI. He has tightened his face gear around May 1 and this may be reflected and lower AHI now, and  lower air leaks.  He reported FSS at 30, Epworth 5 . Chief complaint according to patient : " My wife stated I snore" , and "  my sleep is less restoring and I get fatigued.  " Mr. Belt reports that he has a regular work hours works often long hours, and therefore has not been able to establish a routine sleep time. 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 His systemic ventricle has been enlarged over the last 6 months and he has been woken by a rapid heart rate and palpitations was diagnosed with atrial flutter when he [facility] to [facility]. He was treated with Bonney Roussel was, diltiazem and underwent a DC CV under the guidance of Dr. Virl Axe, cardio- electro-physiologist. He remains on Naprosyn as needed, indomethacin with meals, has also hydrocodone for pain ordered as a when necessary medication, Coreg, Lotensin and the above-mentioned and appears. He resides near [facility] and the sleep study was more convenient to be performed here in [place] than at [place], [place]. He works as a  Financial controller. Sleep medical history and family sleep history:  Parents are both deceased. Father when he was 29 , and his mother of multiple myeloma about 6 years ago. Social history: ETOH- on week ends,  beer about 4-6. Caffeine - occasional Soda once or twice a week-, not coffee, rarely tea.  No tobaco use.    Review of Systems: Out of a complete 14 system review, the patient complains of only the following symptoms, and all other reviewed systems are negative. Snoring, dry mouth.The patient has louder heart beats on his right than on his left, due to his congenitally existing trans position of the great arteries right ventricle ejection fraction is over 50% left ejection fraction over 50% he is status post closure of a ventriculoseptal defect. Facial erythema.    Epworth score 3, Fatigue severity score  24  , depression score 0. N/a    Social History   Socioeconomic History   Marital status: Married    Spouse name: Kimberley "Maudie Mercury"   Number of children: 2   Years of education: 14   Highest education level: Not on file  Occupational History   Occupation: [employer]   Tobacco Use   Smoking status: Never   Smokeless tobacco: Never  Vaping Use   Vaping Use: Never used  Substance and Sexual Activity   Alcohol use: Not Currently   Drug use: No   Sexual activity: Not on file  Other Topics Concern   Not on file  Social History Narrative   Lives at home with wife and kids   Caffeine use: Tea/soda occass (2-3 per week)   Social Determinants of Health   Financial Resource Strain: Not on file  Food Insecurity: Not on file  Transportation Needs: Not on file  Physical Activity: Not on file  Stress: Not on file  Social Connections: Not on file  Intimate Partner Violence: Not on file    Family History  Problem Relation Age of Onset   Multiple myeloma Mother    Healthy Brother    Healthy Brother    Healthy Daughter    Healthy Daughter    Healthy Son     Past  Medical History:  Diagnosis Date   Hypertension    Neuropathy    OSA on CPAP 07/17/2015   Osteoarthritis     Past Surgical History:  Procedure Laterality Date   CARDIAC SURGERY  1976   age 56 and age 74  at [place] 04/14/2015   Procedure: Cardioversion;  Surgeon: Deboraha Sprang, MD;  Location: [facility];  Service: Cardiovascular;  Laterality: N/A;   I & D EXTREMITY  2004   right leg infection   KNEE ARTHROSCOPY WITH MEDIAL MENISECTOMY Left 12/14/2012   Procedure: KNEE ARTHROSCOPY WITH PARTIAL MEDIAL AND LATERAL MENISECTOMY EXTENSIVE SYNOVECTOMY REMOVAL NODULE LEFT KNEE;  Surgeon: Ninetta Lights, MD;  Location: [facility];  Service: Orthopedics;  Laterality: Left;   TEE WITHOUT CARDIOVERSION N/A 04/14/2015   Procedure: TRANSESOPHAGEAL ECHOCARDIOGRAM (TEE);  Surgeon: Josue Hector, MD;  Location: [facility];  Service: Cardiovascular;  Laterality: N/A;   VASECTOMY  2010   general anesth    Current Outpatient Medications  Medication Sig Dispense Refill   apixaban (ELIQUIS) 5 MG TABS tablet Take by mouth 2 (two) times daily.      aspirin EC 81 MG tablet Take by mouth.     b complex vitamins capsule Take 1 capsule by mouth daily.     colchicine 0.6 MG tablet Take 1 tablet (0.6 mg total) by mouth as needed. 30 tablet 2   diclofenac Sodium (VOLTAREN) 1 % GEL Apply topically as needed.     dofetilide (TIKOSYN) 250 MCG capsule Take 250 mcg by mouth 2 (two) times daily.     DULoxetine (CYMBALTA) 60 MG capsule Take 60 mg by mouth daily.     Febuxostat 80 MG TABS Take 1 tablet (80 mg total) by mouth daily. 90 tablet 1   levothyroxine (SYNTHROID) 50 MCG tablet Take 50 mcg by mouth daily.     metoprolol succinate (TOPROL-XL) 50 MG 24 hr tablet Take 50 mg by mouth daily.  0   olmesartan (BENICAR) 20 MG tablet Take 20 mg by mouth daily.     predniSONE (DELTASONE) 10 MG tablet Take 1 tablet (10 mg total) by mouth daily as needed. 30 tablet 0    pregabalin (LYRICA) 150 MG capsule Take 150 mg by mouth 2 (two) times daily. As needed     rOPINIRole (REQUIP) 0.25 MG tablet Take by mouth.     torsemide (DEMADEX) 20 MG tablet Take 20 mg by mouth daily.     traMADol (ULTRAM) 50 MG tablet as needed.     traMADol (ULTRAM) 50 MG tablet tramadol 50 mg tablet  Take 1 tablet every day by oral route as needed.     VITAMIN D PO Take by mouth.     potassium chloride SA (K-DUR) 20 MEQ tablet Take by mouth as needed.     No current facility-administered medications for this visit.    Allergies as of 12/15/2020   (No Known Allergies)    Vitals: BP 115/77   Pulse 73   Ht '6\' 1"'  (1.854 m)   Wt 298 lb 8 oz (135.4 kg)   BMI 39.38 kg/m  Last Weight:  Wt Readings from Last 1 Encounters:  12/15/20 298 lb 8 oz (135.4 kg)   HKV:QQVZ mass index is 39.38 kg/m.     Last Height:   Ht Readings from Last 1 Encounters:  12/15/20 '6\' 1"'  (1.854 m)    Physical exam:  General: The patient is awake, alert and appears not in acute distress. The patient is well groomed. Head: Normocephalic, atraumatic. Neck is supple. Mallampati 4,  neck circumference:19.5. Nasal airflow unrestricted - rhinitis, TMJ is not evident . Retrognathia is not seen.  Cranial nerves, intact smell and taste , normal facial strength and  sensation, no visual field changes - he is now using corrective lenses.   Cardiovascular:  Regular rate and rhythm ,  without distended neck veins.  The patient has louder heart beats on his right than on his left, due to his congenitally existing trans position of the great arteries right ventricle ejection fraction is over 50% left ejection fraction over 50% he is status post closure of a ventriculoseptal defect. Respiratory: Lungs are clear to auscultation. Skin:  With ankle edema, and gout.  Trunk: BMI is elevated. The patient's posture is erect   Neurologic exam : The patient is awake and alert, oriented to place and time.    Cranial  nerves: Pupils are equal and briskly reactive to light.  Visual fields by finger perimetry are intact. Hearing to finger rub intact.   Facial sensation intact to fine touch.  Facial motor strength is symmetric and tongue and uvula move midline. Shoulder shrug was symmetrical.   Normal strength, grip strength. Normal DTR.    The patient was advised of the nature of the diagnosed sleep disorder , the treatment options and risks for general a health and wellness arising from not treating the condition.  I spent more than 20 minutes of face to face time with the patient. Greater than 50% of time was spent in counseling and coordination of care. We have discussed the diagnosis and differential and I answered the patient's questions.     Assessment:  After physical and neurologic examination, review of laboratory studies,  Personal review of imaging studies, reports of other /same  Imaging studies ,  Results of polysomnography/ neurophysiology testing and pre-existing records as far as provided in visit., my assessment is :  Mr. Ortwein had one more episode of documented atrial flutter, and has a highly unusual cardiovascular anatomy with transposition of the great vessels.  Palpitations have occurred but has only woke him out of sleep once. The last one, 05-26-2017- produced palpitations at 9 PM and lasted 16 hours, he consulted his cardiologist by phone- and was asked to come to ED, [place]-  He was seen at [facility] ,admitted, and had a cardioversion. Medication rate control failed.   The ventriculoseptal defect, dextrocardia congenital pulmonary stenosis all contribute to a higher risk of obstructive sleep apnea in the context of obesity and witnessed snoring and apneas per spouse.   1) OSA-Mr. Colucci was diagnosed with a severe form of apnea at an AHI of 89.8/h,  RDI of 90.2/h, and  hypoxemia for over 100 minutes in the diagnostic part of the study only. Also, oxygen nadir was 58% Patient with high  cardiovascular risk of OSA- Status of transposition of the great arteries.  Compliant CPAP user. Again flutter attack.  He has begun using CPAP auto titration with a full face mask his AHI is reduced to 3.0 with FFM.  5-20 cm water . I will continue him at 6- 16 cm water, and  2 cm EPR. Offer a refitting for less bulky type of a mask.   2) Neuropathy, sensory-  and lightheadedness. Dysautonomia and  numbness.   3) RLS ? Will d/c requip as it may contribute to sleep attacks.     Plan:  Treatment plan and additional workup :  D/c requip  Encourage hydration with water- 64 ounces a day.   Get up slowly. Orthostatic BP and heart arte to be retaken next visit.     Rv in 4- 6 month with NP .     Asencion Partridge Alyra Patty  MD  12/15/2020

## 2020-12-24 ENCOUNTER — Encounter: Payer: Self-pay | Admitting: Neurology

## 2020-12-24 MED ORDER — PREGABALIN 150 MG PO CAPS: 150.0000 mg | ORAL_CAPSULE | Freq: Two times a day (BID) | ORAL | 1 refills | Status: DC

## 2021-01-05 ENCOUNTER — Other Ambulatory Visit: Payer: Self-pay | Admitting: Neurology

## 2021-01-05 MED ORDER — PREGABALIN 150 MG PO CAPS: 150.0000 mg | ORAL_CAPSULE | Freq: Two times a day (BID) | ORAL | 1 refills | Status: DC

## 2021-01-06 ENCOUNTER — Telehealth: Payer: Self-pay

## 2021-01-06 NOTE — Telephone Encounter (Signed)
Alric Ran from [employer] left a voicemail stating she sent a request for patient's medical records to the [facility] general medical records department.  They just informed me that your office processes your own requests and that they would forward the information to you today.  We need these records ASAP.  Please fax to #[phone].  Patient has a Social Security disability hearing coming up and the judge needs the records to review to help with his case.  I need someone to call me back at #[phone] and let me know the status of this request.

## 2021-01-07 NOTE — Telephone Encounter (Signed)
I lmom for [place] with [employer] in regards to medical records request that is filed in patient's chart. Waiting for response to see if it has been, or is being processed. 01/06/2021

## 2021-01-07 NOTE — Telephone Encounter (Signed)
Per Sammie with [employer], medical records were processed, and faxed 01/06/2021 after I left a message for [employer] to check.

## 2021-01-26 ENCOUNTER — Other Ambulatory Visit: Payer: Self-pay | Admitting: Neurology

## 2021-01-26 DIAGNOSIS — E538 Deficiency of other specified B group vitamins: Secondary | ICD-10-CM

## 2021-01-26 DIAGNOSIS — R5383 Other fatigue: Secondary | ICD-10-CM

## 2021-01-26 DIAGNOSIS — G609 Hereditary and idiopathic neuropathy, unspecified: Secondary | ICD-10-CM

## 2021-01-26 DIAGNOSIS — E531 Pyridoxine deficiency: Secondary | ICD-10-CM

## 2021-01-26 DIAGNOSIS — R200 Anesthesia of skin: Secondary | ICD-10-CM

## 2021-01-26 DIAGNOSIS — R7309 Other abnormal glucose: Secondary | ICD-10-CM

## 2021-01-26 DIAGNOSIS — E519 Thiamine deficiency, unspecified: Secondary | ICD-10-CM

## 2021-01-26 DIAGNOSIS — R202 Paresthesia of skin: Secondary | ICD-10-CM

## 2021-01-27 ENCOUNTER — Telehealth: Payer: Self-pay | Admitting: Neurology

## 2021-01-27 NOTE — Telephone Encounter (Signed)
Dr. Vickey Huger: "Dr. Lucia Gaskins performed an EMG nerve conduction study on your patient.  It showed a distal axonal sensory polyneuropathy in the lower extremities and possibly remote L5-S1 radiculopathy.  Dr. Lucia Gaskins did have a long discussion with this patient about sensory polyneuropathy and the different causes.  He has prediabetes which is his main risk factor but he also has central obesity which I discussed with him is also now ain independent risk factor for polyneuropathy. "   I will be sending you the report shortly but I am happy to order a thorough serum evaluation for patient for any other causes(if any), I discussed this with him and said I would ask you how you wanted to proceed and that I was happy to order the testing.   Answer: Willie Novas, MD : Please proceed,  Dr. Lucia Gaskins.

## 2021-01-28 ENCOUNTER — Telehealth: Payer: Self-pay | Admitting: *Deleted

## 2021-01-28 NOTE — Telephone Encounter (Addendum)
Called pt & LVM (ok per DPR) advising Dr Lucia Gaskins placed orders for blood work to check a thorough peripheral neuropathy panel for pt. [place] office hours in message and advised pt he can come by the office during office hours have these drawn. Left office number in message as well for any questions.    ----- Message from Anson Fret, MD sent at 01/26/2021 12:22 PM EST ----- Regarding: FW: patient with sensory neuropathy I saw this patient last month for peripheral neuropathy likely due to his diabetes. But I offered to check a thorouh peripheral neuropathy panel for him. I have placed blood work and he is welcome to come to the office to get it completed to see if we can find any other reason for his neuropathy, please let him know thanks

## 2021-02-04 NOTE — Telephone Encounter (Signed)
Called pt again and LVM (ok per DPR) advising him that lab orders have been placed for Dr Lucia Gaskins to check a thorough neuropathy panel. Left office hours and number in message.   I also called the pt's wife Selena Batten (on [place]) and LVM asking for her to have pt call us back as soon as possible.

## 2021-02-05 ENCOUNTER — Ambulatory Visit: Payer: 59 | Admitting: Neurology

## 2021-03-19 ENCOUNTER — Encounter: Payer: Self-pay | Admitting: Internal Medicine

## 2021-03-19 DIAGNOSIS — M1A00X0 Idiopathic chronic gout, unspecified site, without tophus (tophi): Secondary | ICD-10-CM

## 2021-03-20 MED ORDER — COLCHICINE 0.6 MG PO TABS: 0.6000 mg | ORAL_TABLET | ORAL | 0 refills | Status: DC | PRN

## 2021-03-20 MED ORDER — PREDNISONE 10 MG PO TABS
10.0000 mg | ORAL_TABLET | Freq: Every day | ORAL | 0 refills | Status: DC | PRN
Start: 2021-03-20 — End: 2021-06-17

## 2021-03-20 NOTE — Telephone Encounter (Signed)
Patient is scheduled for a follow up on 04/08/2021. Last refill of colchicine and prednisone: 10/31/2020

## 2021-04-07 NOTE — Progress Notes (Deleted)
Office Visit Note  Patient: Willie Daniel             Date of Birth: 10-Mar-1970           MRN: [id number]             PCP: Sharilyn Sites, MD Referring: Sharilyn Sites, MD Visit Date: 04/08/2021   Subjective:  No chief complaint on file.   History of Present Illness: Willie Daniel is a 51 y.o. male here for follow up for chronic, erosive, tophaceous gout on febuxostat 80 mg PO daily and colchicine 0.6 mg daily and prednisone 10 mg as needed. ***   Previous HPI 10/06/20 Willie Daniel is a 51 y.o. male here for follow up for chronic, erosive, tophaceous, polyarticular gout on colchicine 0.6 mg PO daily and febuxostat 40 mg PO daily.  He feels overall symptoms are doing reasonably well since her last visit.  He has had about 2-3 flareups of increased joint pain most recently on the right side did improve after about 2 days of taking colchicine.  He was vacationing in [place] recently able to walk around the park all day without interruption or exacerbation of knee arthritis.  This is question about any alternative options or pain options due to the high monthly price of Uloric prescription.   07/15/20 Willie Daniel is a 51 y.o. male here for evaluation of osteoarthritis, gouty arthritis, with chronic left knee pain.  He has had chronic joint pains and episodic gout flares in numerous areas going back more than 10 years with involvement in his feet knees elbows hands and possibly shoulders.  He also has some known osteoarthritis in joints and was recently seen by orthopedic surgery clinic with steroid injection of the right knee and hyaluronic acid supplementation injection of the left knee.  He has had a previous left knee arthroscopy in 2014.  His gout has been problematic for a long time he is seeing [facility] rheumatology for this issue during the past approximately 1.5 years he was titrated to a high dose of allopurinol with as needed colchicine use and achieved a uric acid goal of less  than 6.  However despite this he has had a more gout pain and inflammation in the past 6 months than ever before.  He was previously recommended against taking prophylactic colchicine daily with what sounds like some liver or renal function changes on labs and also having chronic diarrhea from the medicine.  For recent flares he received a steroid taper prescription from his PCP that was much more beneficial for the symptoms flare this was around March. He also thinks that trying the colchicine tablets was more effective than capsules for recent flares. He is concerned about lack of disease control despite good uric acid level and stopping all alcohol use and red meats and shellfish and very limited soft drink use and wonders if different treatments might be more beneficial.   No Rheumatology ROS completed.   PMFS History:  Patient Active Problem List   Diagnosis Date Noted   Neuropathy 08/27/2020   Bilateral primary osteoarthritis of knee 07/15/2020   Benign essential hypertension 06/19/2020   Chronic gouty arthritis 06/19/2020   Chronic kidney disease 06/19/2020   Hypothyroidism 06/19/2020   Idiopathic peripheral neuropathy 88/32/5498   Chronic systolic CHF (congestive heart failure) ([place]) 01/29/2019   History of cardioversion 07/21/2017   Atrial flutter with rapid ventricular response ([place]) 07/21/2017   AVNRT (AV nodal re-entry tachycardia) ([place]) 05/26/2017  Nocturnal hypoxemia 07/15/2016   OSA on CPAP 07/17/2015   Atrial flutter ([place]) 04/10/2015   L-loop transposition of great arteries 10/12/2011    Past Medical History:  Diagnosis Date   Hypertension    Neuropathy    OSA on CPAP 07/17/2015   Osteoarthritis     Family History  Problem Relation Age of Onset   Multiple myeloma Mother    Healthy Brother    Healthy Brother    Healthy Daughter    Healthy Daughter    Healthy Son    Past Surgical History:  Procedure Laterality Date   CARDIAC SURGERY  1976   age 58 and age 54 at  [place] 04/14/2015   Procedure: Cardioversion;  Surgeon: Deboraha Sprang, MD;  Location: [facility];  Service: Cardiovascular;  Laterality: N/A;   I & D EXTREMITY  2004   right leg infection   KNEE ARTHROSCOPY WITH MEDIAL MENISECTOMY Left 12/14/2012   Procedure: KNEE ARTHROSCOPY WITH PARTIAL MEDIAL AND LATERAL MENISECTOMY EXTENSIVE SYNOVECTOMY REMOVAL NODULE LEFT KNEE;  Surgeon: Ninetta Lights, MD;  Location: [facility];  Service: Orthopedics;  Laterality: Left;   TEE WITHOUT CARDIOVERSION N/A 04/14/2015   Procedure: TRANSESOPHAGEAL ECHOCARDIOGRAM (TEE);  Surgeon: Josue Hector, MD;  Location: [facility];  Service: Cardiovascular;  Laterality: N/A;   VASECTOMY  2010   general anesth   Social History   Social History Narrative   Lives at home with wife and kids   Caffeine use: Tea/soda occass (2-3 per week)   Immunization History  Administered Date(s) Administered   PFIZER(Purple Top)SARS-COV-2 Vaccination 06/04/2019     Objective: Vital Signs: There were no vitals taken for this visit.   Physical Exam   Musculoskeletal Exam: ***  CDAI Exam: CDAI Score: -- Patient Global: --; Provider Global: -- Swollen: --; Tender: -- Joint Exam 04/08/2021   No joint exam has been documented for this visit   There is currently no information documented on the homunculus. Go to the Rheumatology activity and complete the homunculus joint exam.  Investigation: No additional findings.  Imaging: No results found.  Recent Labs: Lab Results  Component Value Date   WBC 9.2 08/21/2020   HGB 13.7 08/21/2020   PLT 356 08/21/2020   NA 143 10/06/2020   K 4.7 10/06/2020   CL 103 10/06/2020   CO2 31 10/06/2020   GLUCOSE 100 (H) 10/06/2020   BUN 25 10/06/2020   CREATININE 1.35 (H) 10/06/2020   BILITOT 0.9 10/06/2020   AST 20 10/06/2020   ALT 20 10/06/2020   PROT 6.7 10/06/2020   CALCIUM 9.4 10/06/2020   GFRAA 76 08/21/2020     Speciality Comments: No specialty comments available.  Procedures:  No procedures performed Allergies: Patient has no known allergies.   Assessment / Plan:     Visit Diagnoses: No diagnosis found.  ***  Orders: No orders of the defined types were placed in this encounter.  No orders of the defined types were placed in this encounter.    Follow-Up Instructions: No follow-ups on file.   Collier Salina, MD  Note - This record has been created using [employer].  Chart creation errors have been sought, but may not always  have been located. Such creation errors do not reflect on  the standard of medical care.

## 2021-04-08 ENCOUNTER — Encounter: Payer: Self-pay | Admitting: Internal Medicine

## 2021-04-08 ENCOUNTER — Ambulatory Visit: Payer: 59 | Admitting: Internal Medicine

## 2021-04-14 DIAGNOSIS — Q221 Congenital pulmonary valve stenosis: Secondary | ICD-10-CM | POA: Diagnosis not present

## 2021-04-14 DIAGNOSIS — I5022 Chronic systolic (congestive) heart failure: Secondary | ICD-10-CM | POA: Diagnosis not present

## 2021-04-14 DIAGNOSIS — Q203 Discordant ventriculoarterial connection: Secondary | ICD-10-CM | POA: Diagnosis not present

## 2021-04-14 DIAGNOSIS — Q210 Ventricular septal defect: Secondary | ICD-10-CM | POA: Diagnosis not present

## 2021-05-25 ENCOUNTER — Other Ambulatory Visit: Payer: Self-pay | Admitting: Internal Medicine

## 2021-05-25 DIAGNOSIS — M1A00X0 Idiopathic chronic gout, unspecified site, without tophus (tophi): Secondary | ICD-10-CM

## 2021-05-25 DIAGNOSIS — N189 Chronic kidney disease, unspecified: Secondary | ICD-10-CM

## 2021-05-25 NOTE — Telephone Encounter (Signed)
Next Visit: 06/17/2021 ? ?Last Visit: 10/06/2020 ?  ?Last Fill: 10/07/2020 ?  ?DX: Chronic gouty arthritis  ?  ?Current Dose per office note 10/06/2020: Take 1 tablet (80 mg total) by mouth daily. ?  ?Labs: 10/06/2020 uric acid: 7.6 ?  ?Okay to refill Febuxostat?  ?

## 2021-05-27 ENCOUNTER — Other Ambulatory Visit (INDEPENDENT_AMBULATORY_CARE_PROVIDER_SITE_OTHER): Payer: Self-pay

## 2021-05-27 DIAGNOSIS — R7309 Other abnormal glucose: Secondary | ICD-10-CM

## 2021-05-27 DIAGNOSIS — R202 Paresthesia of skin: Secondary | ICD-10-CM | POA: Diagnosis not present

## 2021-05-27 DIAGNOSIS — Z0289 Encounter for other administrative examinations: Secondary | ICD-10-CM

## 2021-05-27 DIAGNOSIS — E531 Pyridoxine deficiency: Secondary | ICD-10-CM

## 2021-05-27 DIAGNOSIS — R5383 Other fatigue: Secondary | ICD-10-CM

## 2021-05-27 DIAGNOSIS — R200 Anesthesia of skin: Secondary | ICD-10-CM | POA: Diagnosis not present

## 2021-05-27 DIAGNOSIS — E519 Thiamine deficiency, unspecified: Secondary | ICD-10-CM

## 2021-05-27 DIAGNOSIS — G609 Hereditary and idiopathic neuropathy, unspecified: Secondary | ICD-10-CM | POA: Diagnosis not present

## 2021-05-27 DIAGNOSIS — E538 Deficiency of other specified B group vitamins: Secondary | ICD-10-CM | POA: Diagnosis not present

## 2021-06-02 LAB — MULTIPLE MYELOMA PANEL, SERUM
Albumin SerPl Elph-Mcnc: 3.7 g/dL (ref 2.9–4.4)
Albumin/Glob SerPl: 1.2 (ref 0.7–1.7)
Alpha 1: 0.2 g/dL (ref 0.0–0.4)
Alpha2 Glob SerPl Elph-Mcnc: 0.6 g/dL (ref 0.4–1.0)
B-Globulin SerPl Elph-Mcnc: 1 g/dL (ref 0.7–1.3)
Gamma Glob SerPl Elph-Mcnc: 1.2 g/dL (ref 0.4–1.8)
Globulin, Total: 3.1 g/dL (ref 2.2–3.9)
IgA/Immunoglobulin A, Serum: 303 mg/dL (ref 90–386)
IgG (Immunoglobin G), Serum: 1277 mg/dL (ref 603–1613)
IgM (Immunoglobulin M), Srm: 91 mg/dL (ref 20–172)
Total Protein: 6.8 g/dL (ref 6.0–8.5)

## 2021-06-02 LAB — TSH: TSH: 2.52 u[IU]/mL (ref 0.450–4.500)

## 2021-06-02 LAB — VITAMIN B1: Thiamine: 135.4 nmol/L (ref 66.5–200.0)

## 2021-06-02 LAB — RHEUMATOID FACTOR: Rheumatoid fact SerPl-aCnc: 10.2 IU/mL (ref <14.0)

## 2021-06-02 LAB — SJOGREN'S SYNDROME ANTIBODS(SSA + SSB)
ENA SSA (RO) Ab: 0.2 AI (ref 0.0–0.9)
ENA SSB (LA) Ab: <0.2 AI (ref 0.0–0.9)

## 2021-06-02 LAB — HEPATITIS C ANTIBODY: Hep C Virus Ab: NONREACTIVE

## 2021-06-02 LAB — HEMOGLOBIN A1C
Est. average glucose Bld gHb Est-mCnc: 120 mg/dL
Hgb A1c MFr Bld: 5.8 % — ABNORMAL HIGH (ref 4.8–5.6)

## 2021-06-02 LAB — B12 AND FOLATE PANEL
Folate: 11 ng/mL (ref >3.0)
Vitamin B-12: 692 pg/mL (ref 232–1245)

## 2021-06-02 LAB — METHYLMALONIC ACID, SERUM: Methylmalonic Acid: 142 nmol/L (ref 0–378)

## 2021-06-02 LAB — ANTINUCLEAR ANTIBODIES, IFA: ANA Titer 1: NEGATIVE

## 2021-06-02 LAB — VITAMIN B6: Vitamin B6: 12.3 ug/L (ref 3.4–65.2)

## 2021-06-02 LAB — HEAVY METALS, BLOOD
Arsenic: 2 ug/L (ref 0–9)
Lead, Blood: <1 ug/dL (ref 0.0–3.4)
Mercury: <1 ug/L (ref 0.0–14.9)

## 2021-06-02 LAB — HIV ANTIBODY (ROUTINE TESTING W REFLEX): HIV Screen 4th Generation wRfx: NONREACTIVE

## 2021-06-02 LAB — RPR: RPR Ser Ql: NONREACTIVE

## 2021-06-11 ENCOUNTER — Encounter: Payer: Self-pay | Admitting: Neurology

## 2021-06-15 ENCOUNTER — Ambulatory Visit: Payer: BC Managed Care – PPO | Admitting: Neurology

## 2021-06-15 ENCOUNTER — Encounter: Payer: Self-pay | Admitting: Neurology

## 2021-06-15 VITALS — BP 120/79 | HR 79 | Ht 73.0 in | Wt 313.5 lb

## 2021-06-15 DIAGNOSIS — I471 Supraventricular tachycardia: Secondary | ICD-10-CM

## 2021-06-15 DIAGNOSIS — E669 Obesity, unspecified: Secondary | ICD-10-CM | POA: Diagnosis not present

## 2021-06-15 DIAGNOSIS — G609 Hereditary and idiopathic neuropathy, unspecified: Secondary | ICD-10-CM | POA: Diagnosis not present

## 2021-06-15 DIAGNOSIS — G4733 Obstructive sleep apnea (adult) (pediatric): Secondary | ICD-10-CM

## 2021-06-15 DIAGNOSIS — Q203 Discordant ventriculoarterial connection: Secondary | ICD-10-CM

## 2021-06-15 DIAGNOSIS — I4719 Other supraventricular tachycardia: Secondary | ICD-10-CM

## 2021-06-15 DIAGNOSIS — Z9989 Dependence on other enabling machines and devices: Secondary | ICD-10-CM

## 2021-06-15 NOTE — Patient Instructions (Signed)

## 2021-06-15 NOTE — Progress Notes (Addendum)
?SLEEP MEDICINE CLINIC ? ? ?Provider:  Melvyn Novasarmen  Brooklyn Jeff, M D  ?Referring Provider: Merryl Hackerhomas Bashore,  [facility]  ? ?[facility] Care Physician:  [facility]  ? ? ?06-15-2021: RV for Mr. Willie Daniel, a 51 year old caucasian patient with CPAP use. He was finally approved for disability.  ?He has a congenital heart condition. Atrial fib, CHF,  He has dextrocardia, pulmonic stenosis which is congenital is a transposition of the great arteries, ventricular septal defect and was referred for the sleep study based on the concerns of his cardiologist. His systemic ventricle has been enlarged.  ?Had less outdoor activity, eating out more.  ?-, walking is painful with neuropathic pain and low back pain, he put on weight.  ?3 children, middle child going to college this summer. Willie ParishYoungest is 13 now and in middle school.  ? ?Willie Daniel compliance for CPAP has dropped in the last 3 months sore out of 90 days he has used the machine 61 which is a compliance of 68%.  But he is improving over the last 30 days.  So there is an upward trend his average day user time is 4 hours 53 minutes on days that he uses CPAP and his AutoSet is still between 6 and 16 cmH2O was 2 cm EPR, he does have obstructive apneas that are remaining present at and residual AHI of 5.8/h.  His current pressure needs is 14.7 cmH2O at the 95th percentile.  He does have some air leakage.  Overall his apnea index is sufficient. ?He got a new machine 01-2022 and tried a new mask- - he alternates use of both. He has an SD chip on his older machine and we couldn't merge it.   ? ?1 Patient Communication ?    ?Component Ref Range & Units 2 wk ago  ?Hgb A1c MFr Bld 4.8 - 5.6 % 5.8 High    ?Comment:          Prediabetes: 5.7 - 6.4  ?         Diabetes: >6.4  ?         Glycemic control for adults with diabetes: <7.0   ?Est. average glucose Bld gHb Est-mCnc mg/dL 409120   ?Resulting Agency  [facility]  ?  ? ? ? ? ?10-10-2022Rm 10, alone. Here to f/u post nerve  conduction study results. Pt experienced dizziness and inability to walk today in office, due to getting up to fast. Pt reports feeling fine now. 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 Creepy  crawly sensation   There is also a suggestion of remote right L5-S1 radiculopathy. He reports no shooting pain, just numbness from below the knee to the toes, bilaterally- he scratched his leg during yard work and did not even feel the cut not the bleeding. He is anticoagulated.  ?I will d/c the Requip and see if this makes him less sleepy. ?CPAP -we discussed the nerve conduction study results above which was just performed on 10-3 22.  There were no muscle abnormalities noted so no weakness was verified but we see clearly a pattern of neuropathy.  He continues to use CPAP compliantly every night and has had a good resolution of his apnea.  But he does report that he feels recently more often sleepier and maybe has even microsleep attacks, another concern that Requip may have provoked some of these. ?Lyrica also can provoke this.  ? ? ? ? ?HPI:  11-05-2020,  ?Patient is here for CPAP compliance follow.  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  He had a cardioversion about 3 and to 4 years ago he had remained on current sinus rhythm.  His medications are updated below.  He has been on an auto titration ResMed machine.  His residual AHI in the past was always under 5 so very good control of his apnea.06/15/21 ?Here for yearly CPAP f/u. Pt would like a new CPAP, CPAP over 14 years old. Pt states he has to place it down below, water is sitting on the tubes and making noises.  ? ? ?100% compliance with a 90% compliance for over 4 hours the average use at time of use is 5 hours 46 minutes.  The minimum pressure 6 maximum pressure 16  cmH2O was 2 cm EPR residual AHI is 4.0.  His 95th percentile pressure is 12.7.  He does have some air leaking out of the full facemask and is not always possible to completely eliminate that especially if there is facial hair.  He may feel more comfortable with a smaller mask like a nasal mask we could definitely offer.  At this time I am happy with his apnea control.  The patient has stated that he was seen by Dr. Gerilyn Pilgrim for neuropathy and he would like to transfer his neuropathy care here.  This should not be a problem I will review the tests that were already done for him in order for this disease work-up the last labs I have in our epic system pertaining to this patient's possible neuropathy date from 2017 so they will be hardly appropriate to reference here today.   ?One problem is the gouty arthritis that also leads to pain.  ? ?,Lyrica helps with some pain, tramadol as needed.  ?He felt Cymbalta  has not added benefit.  ? ?He advised me that a nephew 2022/07/21) died of Fentanyl Overdose. He is reluctant to take narcotics, understandable.  ? ? ? ? ?  ?  ?  ? ? ? ? ? ?  Willie Daniel is a 51 y.o. male , seen here as a revisit  from [facility] cardiology, WillieThomas Bashore, ?Willie Daniel has undergone cardioversion for atrial flutter on 05-26-2017, under the guidance of Dr. Earma Reading. 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  ?He is a compliance CPAP user, and on a FFM, which sometimes bothers him.  ?Willie Daniel has recovered well from the cardioversion, his current rhythm is sinus. His medications include Lotensin, Lasix, indomethacin, Toprol 50 mg extended release form daily, dofetilide 250 mcg 2 times daily and baby aspirin 1 time a day. ?Willie Daniel is using a Pension scheme manager sense 10 AutoSet function between 5 and 20 cmH2O 2 cm expiratory pressure relief, the 95th percentile pressure at night is 13.4 cmH2O it leaves him with a residual AHI of 3.2 most of the apneas that are still found out  obstructive in nature, yet he has definitely enough blood pressure window to compensate.  The average use of time is 5 hours 93% compliance by days and 23 complaint percent compliance by over 4-hour compliance rules.  He sometimes Daniel asleep and forgets to put the CPAP on first, and he had to use a different machine from his own while being hospitalized at [facility]. ? ?I  had the pleasure of seeing Willie Daniel today on 07/15/2016, he has been a compliant CPAP user for the last 28 out of 30 days and 87% compliance and an average user time of 5 hours and 12 minutes. His residual AHI is 3.0, which also is an excellent result all residual apneas are obstructive in nature. His 95th percentile pressure is 12.4 cm water he does have occasionally high air leaks. He is using an AutoSet between 5 and 20 cm water with 2 cm expiratory pressure relief. Based on current numbers and the patient's report of low daytime sleepiness reflected in an Epworth sleepiness score of 3 points fatigue severity score of 24 points, then needs to be no adjustments made. I will follow him once a year for CPAP compliance. I will send a letter to his [facility] cardiologist. There is no local primary care physician to be informed. He has seen [place] [facility] in [place].  ? ?Interval history from 07/17/2015, ?Willie Daniel is here today to follow-up on his recent sleep study dated 05/19/2015. He had indeed a shocking AHI of 89.8 and an RDI of 90.2. There was no supine exenteration or REM accentuation noted. He did not retain CO2. He had a total of 104 minutes of oxygen desaturation with a nadir of 58%. Based on these results the study was split for a CPAP titration. Pressures between 5 and 15 cm water were explored and the 15 cm water pressure achieved an AHI of 0.0. The patient developed some periodic limb movements but the arousals related to these were only 1.3 per hour. He was placed on an AutoPap between 5 and 20 cm water with a quadro fullface mask  medium size. ?I'm able today to review his outer titration. The patient is compliant has used the machine for 27 out of 30 days and for 25 days over 4 hours. Total daily user time is 5-1/2 hours, residual AHI is 6.9 all

## 2021-06-15 NOTE — Progress Notes (Signed)
CM sent to [facility] for new order ?

## 2021-06-16 ENCOUNTER — Telehealth: Payer: Self-pay

## 2021-06-16 DIAGNOSIS — M1A00X0 Idiopathic chronic gout, unspecified site, without tophus (tophi): Secondary | ICD-10-CM

## 2021-06-16 DIAGNOSIS — M1A9XX0 Chronic gout, unspecified, without tophus (tophi): Secondary | ICD-10-CM

## 2021-06-16 NOTE — Telephone Encounter (Signed)
Patient called requesting prescription refill of Prednisone to be sent to [facility] in [place].  Patient states he is experiencing a gout flair in his left foot and right heel.   ?

## 2021-06-17 ENCOUNTER — Ambulatory Visit: Payer: Self-pay | Admitting: Internal Medicine

## 2021-06-17 MED ORDER — PREDNISONE 10 MG PO TABS: 10.0000 mg | ORAL_TABLET | Freq: Every day | ORAL | 0 refills | Status: DC | PRN

## 2021-06-17 NOTE — Telephone Encounter (Signed)
New Rx for prednisone 10 mg tablets sent to [place] [facility].

## 2021-06-17 NOTE — Telephone Encounter (Signed)
Left message to advise patient prescription has been sent to the pharmacy.  

## 2021-06-26 ENCOUNTER — Other Ambulatory Visit: Payer: Self-pay | Admitting: Internal Medicine

## 2021-06-26 DIAGNOSIS — N189 Chronic kidney disease, unspecified: Secondary | ICD-10-CM

## 2021-06-26 DIAGNOSIS — M1A00X0 Idiopathic chronic gout, unspecified site, without tophus (tophi): Secondary | ICD-10-CM

## 2021-06-26 NOTE — Telephone Encounter (Signed)
Next Visit: 07/14/2021 ?  ?Last Visit: 10/06/2020 ?  ?Last Fill: 10/07/2020 ?  ?DX: Chronic gouty arthritis  ?  ?Current Dose per office note 10/06/2020: Take 1 tablet (80 mg total) by mouth daily. ?  ?Labs: 10/06/2020 uric acid: 7.6 ?  ?Okay to refill Febuxostat?  ?

## 2021-07-09 NOTE — Progress Notes (Signed)
? ?Office Visit Note ? ?Patient: Willie Daniel             ?Date of Birth: 01/22/1971           ?MRN: [id number]             ?PCP: Sharilyn Sites, MD ?Referring: Sharilyn Sites, MD ?Visit Date: 07/14/2021 ? ? ?Subjective:  ?Gout (Left 2nd digit locking) ? ? ?History of Present Illness: Willie Daniel is a 51 y.o. male here for follow up for chronic, erosive, tophaceous, polyarticular gout on colchicine 0.6 mg PO daily and febuxostat 80 mg PO daily. Overall he is doing well he continues to experience intermittent flare ups of joint pain but better most of the time. Called in for repeat steroid treatment 4/11. He noticed increased stiffness and catching or locking in his right index finger usually first thing in the morning. ? ?Previous HPI ?10/06/2020 ?Willie Daniel is a 52 y.o. male here for follow up for chronic, erosive, tophaceous, polyarticular gout on colchicine 0.6 mg PO daily and febuxostat 40 mg PO daily.  He feels overall symptoms are doing reasonably well since her last visit.  He has had about 2-3 flareups of increased joint pain most recently on the right side did improve after about 2 days of taking colchicine.  He was vacationing in [place] recently able to walk around the park all day without interruption or exacerbation of knee arthritis.  This is question about any alternative options or pain options due to the high monthly price of Uloric prescription. ?  ?08/21/20 ?Willie Daniel is a 51 y.o. male here for follow up for chronic, erosive, tophaceous, polyarticular gout now on uloric 40 mg PO daily, colchicine 0.6 mg PO daily, and finishing a 2 week prednisone taper for recent flare. After steroid shot and starting prednisone his symptoms are doing well today, able to walk unaided and fully extend joints and swelling improved. No noticeable side effects starting the uloric. ?  ?08/12/20 ?Willie Daniel is a 51 y.o. male here for follow up for chronic, erosive gouty arthritis currently on allopurinol  600 mg PO daily and colchicine 0.52m PO daily. Since his last visit symptoms remain severe with pain pretty much every day. Currently experiencing this with his right shoulder, left elbow, right knee, and left foot. His hand swelling is somewhat less than previous. He borrowed some colchicine tablets and thinks these are slightly more effective than the capsules he had been taking. Symptoms are overall very problematic interfering with daily activity. Workup at last visit demonstrated erosive changes in multiple joints. Uric acid was remaining at goal since end of last year without any reduction in symptoms. ?  ?07/15/20 ?Willie WENIGis a 51y.o. male here for evaluation of osteoarthritis, gouty arthritis, with chronic left knee pain.  He has had chronic joint pains and episodic gout flares in numerous areas going back more than 10 years with involvement in his feet knees elbows hands and possibly shoulders.  He also has some known osteoarthritis in joints and was recently seen by orthopedic surgery clinic with steroid injection of the right knee and hyaluronic acid supplementation injection of the left knee.  He has had a previous left knee arthroscopy in 2014.  His gout has been problematic for a long time [facility] during the past approximately 1.5 years he was titrated to a high dose of allopurinol with as needed colchicine use and achieved  a uric acid goal of less than 6.  However despite this he has had a more gout pain and inflammation in the past 6 months than ever before.  He was previously recommended against taking prophylactic colchicine daily with what sounds like some liver or renal function changes on labs and also having chronic diarrhea from the medicine.  For recent flares he received a steroid taper prescription from his PCP that was much more beneficial for the symptoms flare this was around March. He also thinks that trying the colchicine tablets was  more effective than capsules for recent flares. He is concerned about lack of disease control despite good uric acid level and stopping all alcohol use and red meats and shellfish and very limited soft drink use and wonders if different treatments might be more beneficial. ? ? ?Review of Systems  ?Constitutional:  Positive for fatigue.  ?HENT:  Positive for mouth dryness.   ?Eyes:  Positive for dryness.  ?Respiratory:  Positive for shortness of breath.   ?Cardiovascular:  Positive for swelling in legs/feet.  ?Gastrointestinal:  Negative for constipation.  ?Endocrine: Positive for excessive thirst and increased urination.  ?Genitourinary:  Negative for difficulty urinating.  ?Musculoskeletal:  Positive for joint pain, gait problem, joint pain, joint swelling and morning stiffness.  ?Skin:  Negative for rash.  ?Allergic/Immunologic: Negative for susceptible to infections.  ?Neurological:  Positive for numbness.  ?Hematological:  Positive for bruising/bleeding tendency.  ?Psychiatric/Behavioral:  Negative for sleep disturbance.   ? ?PMFS History:  ?Patient Active Problem List  ? Diagnosis Date Noted  ? Neuropathy 08/27/2020  ? Bilateral primary osteoarthritis of knee 07/15/2020  ? Benign essential hypertension 06/19/2020  ? Chronic gouty arthritis 06/19/2020  ? Chronic kidney disease 06/19/2020  ? Hypothyroidism 06/19/2020  ? Idiopathic peripheral neuropathy 03/04/2020  ? Chronic systolic CHF (congestive heart failure) ([place]) 01/29/2019  ? History of cardioversion 07/21/2017  ? Atrial flutter with rapid ventricular response ([place]) 07/21/2017  ? AVNRT (AV nodal re-entry tachycardia) ([place]) 05/26/2017  ? Nocturnal hypoxemia 07/15/2016  ? OSA on CPAP 07/17/2015  ? Atrial flutter ([place]) 04/10/2015  ? L-loop transposition of great arteries 10/12/2011  ?  ?Past Medical History:  ?Diagnosis Date  ? Chronic gouty arthritis   ? Hypertension   ? Neuropathy   ? OSA on CPAP 07/17/2015  ? Osteoarthritis   ?  ?Family History  ?Problem  Relation Age of Onset  ? Multiple myeloma Mother   ? Healthy Brother   ? Healthy Brother   ? Healthy Daughter   ? Healthy Daughter   ? Healthy Son   ? ?Past Surgical History:  ?Procedure Laterality Date  ? CARDIAC SURGERY  1976  ? age 64 and age 74 at 82  ? ELECTROPHYSIOLOGIC STUDY N/A 04/14/2015  ? Procedure: Cardioversion;  Surgeon: Deboraha Sprang, MD;  Location: [facility];  Service: Cardiovascular;  Laterality: N/A;  ? I & D EXTREMITY  2004  ? right leg infection  ? KNEE ARTHROSCOPY WITH MEDIAL MENISECTOMY Left 12/14/2012  ? Procedure: KNEE ARTHROSCOPY WITH PARTIAL MEDIAL AND LATERAL MENISECTOMY EXTENSIVE SYNOVECTOMY REMOVAL NODULE LEFT KNEE;  Surgeon: Ninetta Lights, MD;  Location: [facility];  Service: Orthopedics;  Laterality: Left;  ? TEE WITHOUT CARDIOVERSION N/A 04/14/2015  ? Procedure: TRANSESOPHAGEAL ECHOCARDIOGRAM (TEE);  Surgeon: Josue Hector, MD;  Location: [facility];  Service: Cardiovascular;  Laterality: N/A;  ? VASECTOMY  2010  ? general anesth  ? ?Social History  ? ?Social History Narrative  ?  Lives at home with wife and kids  ? Caffeine use: Tea/soda occass (2-3 per week)  ? ?Immunization History  ?Administered Date(s) Administered  ? PFIZER(Purple Top)SARS-COV-2 Vaccination 06/04/2019  ?  ? ?Objective: ?Vital Signs: BP 112/68 (BP Location: Right Arm, Patient Position: Sitting, Cuff Size: Normal)   Pulse 89   Resp 15   Ht '6\' 1"'  (1.854 m)   Wt 296 lb (134.3 kg)   BMI 39.05 kg/m?   ? ?Physical Exam ?Constitutional:   ?   Appearance: He is obese.  ?Cardiovascular:  ?   Rate and Rhythm: Normal rate and regular rhythm.  ?Pulmonary:  ?   Effort: Pulmonary effort is normal.  ?   Breath sounds: Normal breath sounds.  ?Skin: ?   General: Skin is warm and dry.  ?   Comments: Trace bilateral pedal edema  ?Neurological:  ?   Mental Status: He is alert.  ?Psychiatric:     ?   Mood and Affect: Mood normal.  ?  ? ?Musculoskeletal Exam:  ?Shoulders full ROM no tenderness or  swelling ?Elbows full ROM no tenderness or swelling, left olecranon bursa swelling present ?Wrists full ROM no tenderness or swelling ?Fingers full ROM 2nd finger slight catching near full flexion no tenderness to p

## 2021-07-14 ENCOUNTER — Ambulatory Visit: Payer: BC Managed Care – PPO | Admitting: Internal Medicine

## 2021-07-14 ENCOUNTER — Encounter: Payer: Self-pay | Admitting: Internal Medicine

## 2021-07-14 ENCOUNTER — Telehealth: Payer: Self-pay | Admitting: *Deleted

## 2021-07-14 VITALS — BP 112/68 | HR 89 | Resp 15 | Ht 73.0 in | Wt 296.0 lb

## 2021-07-14 DIAGNOSIS — I5022 Chronic systolic (congestive) heart failure: Secondary | ICD-10-CM

## 2021-07-14 DIAGNOSIS — N189 Chronic kidney disease, unspecified: Secondary | ICD-10-CM | POA: Diagnosis not present

## 2021-07-14 DIAGNOSIS — M1A00X0 Idiopathic chronic gout, unspecified site, without tophus (tophi): Secondary | ICD-10-CM | POA: Diagnosis not present

## 2021-07-14 MED ORDER — COLCHICINE 0.6 MG PO TABS: 0.6000 mg | ORAL_TABLET | ORAL | 2 refills | Status: DC | PRN

## 2021-07-14 MED ORDER — FEBUXOSTAT 80 MG PO TABS: 1.0000 | ORAL_TABLET | Freq: Every day | ORAL | 1 refills | Status: DC

## 2021-07-14 NOTE — Telephone Encounter (Signed)
Submitted a Prior Authorization request to  [employer]   for  Febuxostat  via CoverMyMeds. Will update once we receive a response. ? ? ?Received notification from  [facility]  regarding a prior authorization for  Febuxostat . Authorization has been APPROVED from 07/14/2021 to 07/14/2022.  ? ?PA Case: [id number] ?

## 2021-07-15 LAB — BASIC METABOLIC PANEL WITH GFR
BUN/Creatinine Ratio: 16 (calc) (ref 6–22)
BUN: 25 mg/dL (ref 7–25)
CO2: 31 mmol/L (ref 20–32)
Calcium: 10.4 mg/dL — ABNORMAL HIGH (ref 8.6–10.3)
Chloride: 99 mmol/L (ref 98–110)
Creat: 1.57 mg/dL — ABNORMAL HIGH (ref 0.70–1.30)
Glucose, Bld: 97 mg/dL (ref 65–99)
Potassium: 4.3 mmol/L (ref 3.5–5.3)
Sodium: 140 mmol/L (ref 135–146)
eGFR: 52 mL/min/{1.73_m2} — ABNORMAL LOW (ref >=60)

## 2021-07-15 LAB — URIC ACID: Uric Acid, Serum: 9.3 mg/dL — ABNORMAL HIGH (ref 4.0–8.0)

## 2021-07-17 ENCOUNTER — Telehealth: Payer: Self-pay | Admitting: Internal Medicine

## 2021-07-17 NOTE — Telephone Encounter (Signed)
Spoke with pharmacy and advised patient is to take one tablet daily as needed.  ?

## 2021-07-17 NOTE — Telephone Encounter (Signed)
Weston Brass from [facility] pharmacy left a voicemail stating they received a prescription for the patient of Colchicine. Weston Brass states they require clarification on the directions for insurance purposes. [phone]  ?

## 2021-07-24 NOTE — Progress Notes (Signed)
Uric acid level above goal at 9.3 he needs to remain on uloric 80 mg daily for this. Estimated kidney GFR was very slightly worse compared to 9 months ago, I don't think anything needs to be done about this besides monitor if it is indicating a trend when we follow up.

## 2021-08-13 ENCOUNTER — Encounter: Payer: Self-pay | Admitting: Neurology

## 2021-08-13 ENCOUNTER — Other Ambulatory Visit: Payer: Self-pay | Admitting: *Deleted

## 2021-08-13 ENCOUNTER — Other Ambulatory Visit: Payer: Self-pay | Admitting: Neurology

## 2021-08-13 MED ORDER — PREGABALIN 150 MG PO CAPS: 150.0000 mg | ORAL_CAPSULE | Freq: Two times a day (BID) | ORAL | 1 refills | Status: DC

## 2021-08-13 NOTE — Progress Notes (Signed)
Meds ordered this encounter  Medications   pregabalin (LYRICA) 150 MG capsule    Sig: Take 1 capsule (150 mg total) by mouth 2 (two) times daily. As needed    Dispense:  180 capsule    Refill:  1    Dr. Brett Fairy out of office, Dr. Krista Blue covering MD

## 2021-09-15 DIAGNOSIS — E6609 Other obesity due to excess calories: Secondary | ICD-10-CM | POA: Diagnosis not present

## 2021-09-15 DIAGNOSIS — R7309 Other abnormal glucose: Secondary | ICD-10-CM | POA: Diagnosis not present

## 2021-09-15 DIAGNOSIS — Q205 Discordant atrioventricular connection: Secondary | ICD-10-CM | POA: Diagnosis not present

## 2021-09-15 DIAGNOSIS — I370 Nonrheumatic pulmonary valve stenosis: Secondary | ICD-10-CM | POA: Diagnosis not present

## 2021-09-15 DIAGNOSIS — Z0001 Encounter for general adult medical examination with abnormal findings: Secondary | ICD-10-CM | POA: Diagnosis not present

## 2021-09-15 DIAGNOSIS — E039 Hypothyroidism, unspecified: Secondary | ICD-10-CM | POA: Diagnosis not present

## 2021-09-15 DIAGNOSIS — M1A00X0 Idiopathic chronic gout, unspecified site, without tophus (tophi): Secondary | ICD-10-CM | POA: Diagnosis not present

## 2021-09-15 DIAGNOSIS — Z6838 Body mass index (BMI) 38.0-38.9, adult: Secondary | ICD-10-CM | POA: Diagnosis not present

## 2021-09-15 DIAGNOSIS — Q240 Dextrocardia: Secondary | ICD-10-CM | POA: Diagnosis not present

## 2021-09-15 DIAGNOSIS — I4892 Unspecified atrial flutter: Secondary | ICD-10-CM | POA: Diagnosis not present

## 2021-09-15 DIAGNOSIS — Z1331 Encounter for screening for depression: Secondary | ICD-10-CM | POA: Diagnosis not present

## 2021-09-15 DIAGNOSIS — N183 Chronic kidney disease, stage 3 unspecified: Secondary | ICD-10-CM | POA: Diagnosis not present

## 2021-10-03 ENCOUNTER — Other Ambulatory Visit: Payer: Self-pay | Admitting: Internal Medicine

## 2021-10-03 DIAGNOSIS — M1A00X0 Idiopathic chronic gout, unspecified site, without tophus (tophi): Secondary | ICD-10-CM

## 2021-10-05 MED ORDER — PREDNISONE 10 MG PO TABS: 10.0000 mg | ORAL_TABLET | Freq: Every day | ORAL | 0 refills | Status: DC | PRN

## 2021-10-05 NOTE — Telephone Encounter (Signed)
Next Visit: 01/13/2022  Last Visit: 07/14/2021  Last Fill: 06/17/2021  Dx: Chronic gouty arthritis   Current Dose per office note on 07/14/2021: not discussed  Okay to refill Prednisone?

## 2021-10-19 ENCOUNTER — Encounter: Payer: Self-pay | Admitting: Adult Health

## 2021-10-19 ENCOUNTER — Ambulatory Visit: Payer: BC Managed Care – PPO | Admitting: Adult Health

## 2021-10-19 VITALS — BP 101/61 | HR 57 | Ht 74.0 in | Wt 294.0 lb

## 2021-10-19 DIAGNOSIS — G4733 Obstructive sleep apnea (adult) (pediatric): Secondary | ICD-10-CM | POA: Diagnosis not present

## 2021-10-19 DIAGNOSIS — Z9989 Dependence on other enabling machines and devices: Secondary | ICD-10-CM | POA: Diagnosis not present

## 2021-10-19 NOTE — Progress Notes (Signed)
PATIENT: Willie Daniel DOB: November 30, 1970  REASON FOR VISIT: follow up HISTORY FROM: patient PRIMARY NEUROLOGIST: Dr. Brett Fairy  Chief Complaint  Patient presents with   Follow-up    Pt in 9  pt here for CPAP follow up  Pt states no questions or concerns for this visit      HISTORY OF PRESENT ILLNESS: Today 10/19/21:  Willie Daniel is a 51 year old male with a history of peripheral neuropathy and obstructive sleep apnea on CPAP.  He returns today for follow-up.  OSA on CPAP: patient did not bring his SD card today. Unable to get DL. States that he uses it nightly. No new issues.   PN: Currently taking Lyrica 150 mg BID. Reports that lyrica has not been helpful. Reports that wife found neuropathic cream online and that has been helpful. During the day the pain is not that bad. Reports that its worse when he tries to go to bed.  Reports that the numbness bothers him the most.  He states that he can tolerate the pins and needle sensations that he currently has.  No changes with walking balance. Denies falls.    Blood pressure slightly lower today.  He is not symptomatic.  Patient has not eaten or drank much water this morning.  Encouraged him to make sure he is hydrated if he begins to have symptoms he should let his PCP know.  REVIEW OF SYSTEMS: Out of a complete 14 system review of symptoms, the patient complains only of the following symptoms, and all other reviewed systems are negative.  See HPI  ALLERGIES: No Known Allergies  HOME MEDICATIONS: Outpatient Medications Prior to Visit  Medication Sig Dispense Refill   apixaban (ELIQUIS) 5 MG TABS tablet Take by mouth 2 (two) times daily.      aspirin EC 81 MG tablet Take by mouth.     b complex vitamins capsule Take 1 capsule by mouth daily.     colchicine 0.6 MG tablet Take 1 tablet (0.6 mg total) by mouth as needed. 30 tablet 2   dofetilide (TIKOSYN) 250 MCG capsule Take 250 mcg by mouth 2 (two) times daily.     DULoxetine  (CYMBALTA) 60 MG capsule Take 60 mg by mouth daily.     Febuxostat 80 MG TABS Take 1 tablet (80 mg total) by mouth daily. 90 tablet 1   levothyroxine (SYNTHROID) 50 MCG tablet Take 50 mcg by mouth daily.     metoprolol succinate (TOPROL-XL) 25 MG 24 hr tablet Take 25 mg by mouth daily.     olmesartan (BENICAR) 20 MG tablet Take 20 mg by mouth daily.     potassium chloride SA (K-DUR) 20 MEQ tablet Take by mouth as needed.     predniSONE (DELTASONE) 10 MG tablet Take 1 tablet (10 mg total) by mouth daily as needed. 30 tablet 0   pregabalin (LYRICA) 150 MG capsule Take 1 capsule (150 mg total) by mouth 2 (two) times daily. As needed 180 capsule 1   torsemide (DEMADEX) 20 MG tablet Take 20 mg by mouth daily.     traMADol (ULTRAM) 50 MG tablet tramadol 50 mg tablet  Take 1 tablet every day by oral route as needed.     VITAMIN D PO Take by mouth.     No facility-administered medications prior to visit.    PAST MEDICAL HISTORY: Past Medical History:  Diagnosis Date   Chronic gouty arthritis    Hypertension    Neuropathy  OSA on CPAP 07/17/2015   Osteoarthritis     PAST SURGICAL HISTORY: Past Surgical History:  Procedure Laterality Date   [place]   age 66 and age 55 at [place] 04/14/2015   Procedure: Cardioversion;  Surgeon: Deboraha Sprang, MD;  Location: [facility];  Service: Cardiovascular;  Laterality: N/A;   I & D EXTREMITY  2004   right leg infection   KNEE ARTHROSCOPY WITH MEDIAL MENISECTOMY Left 12/14/2012   Procedure: KNEE ARTHROSCOPY WITH PARTIAL MEDIAL AND LATERAL MENISECTOMY EXTENSIVE SYNOVECTOMY REMOVAL NODULE LEFT KNEE;  Surgeon: Ninetta Lights, MD;  Location: [facility];  Service: Orthopedics;  Laterality: Left;   TEE WITHOUT CARDIOVERSION N/A 04/14/2015   Procedure: TRANSESOPHAGEAL ECHOCARDIOGRAM (TEE);  Surgeon: Josue Hector, MD;  Location[facility] END[facility];  Service: Cardiovascular;  Laterality: N/A;    VASECTOMY  2010   general anesth    FAMILY HISTORY: Family History  Problem Relation Age of Onset   Multiple myeloma Mother    Healthy Brother    Healthy Brother    Healthy Daughter    Healthy Daughter    Healthy Son     SOCIAL HISTORY: Social History   Socioeconomic History   Marital status: Married    Spouse name: Kimberley "Kim"   Number of children: 2   Years of education: 14   Highest education level: Not on file  Occupational History   Occupation: [employer]   Tobacco Use   Smoking status: Never   Smokeless tobacco: Never  Vaping Use   Vaping Use: Never used  Substance and Sexual Activity   Alcohol use: Not Currently   Drug use: No   Sexual activity: Not on file  Other Topics Concern   Not on file  Social History Narrative   Lives at home with wife and kids   Caffeine use: Tea/soda occass (2-3 per week)   Social Determinants of Health   Financial Resource Strain: Not on file  Food Insecurity: Not on file  Transportation Needs: Not on file  Physical Activity: Not on file  Stress: Not on file  Social Connections: Not on file  Intimate Partner Violence: Not on file      PHYSICAL EXAM  Vitals:   10/19/21 1102 10/19/21 1142  BP: (!) 94/57 101/61  Pulse: (!) 57   Weight: 294 lb (133.4 kg)   Height: '6\' 2"'  (1.88 m)    Body mass index is 37.75 kg/m.  Generalized: Well developed, in no acute distress    Neurological examination  Mentation: Alert oriented to time, place, history taking. Follows all commands speech and language fluent Cranial nerve II-XII: Extraocular movements were full, visual field were full on confrontational test Head turning and shoulder shrug  were normal and symmetric. Motor: The motor testing reveals 5 over 5 strength of all 4 extremities. Good symmetric motor tone is noted throughout.  Sensory: Sensory testing is intact to soft touch on all 4 extremities. No evidence of extinction is noted.  Gait and station: Gait  is normal slightly wide based.    DIAGNOSTIC DATA (LABS, IMAGING, TESTING) - I reviewed patient records, labs, notes, testing and imaging myself where available.  Lab Results  Component Value Date   WBC 9.2 08/21/2020   HGB 13.7 08/21/2020   HCT 43.4 08/21/2020   MCV 86.6 08/21/2020   PLT 356 08/21/2020      Component Value Date/Time   NA 140 07/14/2021 1541  K 4.3 07/14/2021 1541   CL 99 07/14/2021 1541   CO2 31 07/14/2021 1541   GLUCOSE 97 07/14/2021 1541   BUN 25 07/14/2021 1541   CREATININE 1.57 (H) 07/14/2021 1541   CALCIUM 10.4 (H) 07/14/2021 1541   PROT 6.8 05/27/2021 1332   AST 20 10/06/2020 1149   ALT 20 10/06/2020 1149   BILITOT 0.9 10/06/2020 1149   GFRNONAA 66 08/21/2020 1233   GFRAA 76 08/21/2020 1233   Lab Results  Component Value Date   CHOL 137 04/11/2015   HDL 26 (L) 04/11/2015   LDLCALC 72 04/11/2015   TRIG 194 (H) 04/11/2015   CHOLHDL 5.3 04/11/2015   Lab Results  Component Value Date   HGBA1C 5.8 (H) 05/27/2021   Lab Results  Component Value Date   VITAMINB12 692 05/27/2021   Lab Results  Component Value Date   TSH 2.520 05/27/2021      ASSESSMENT AND PLAN 51 y.o. year old male  has a past medical history of Chronic gouty arthritis, Hypertension, Neuropathy, OSA on CPAP (07/17/2015), and Osteoarthritis. here with:  OSA on CPAP  -Encouraged the patient continue using the CPAP nightly greater than 4 hours each night -Advised that he can bring his ST card by the office at his convenience and we can get a download  2.  Peripheral neuropathy  -Continue Lyrica 150 mg twice a day -Discussed starting nortriptyline however the patient deferred for now -Currently using neuropathy cream bought online.  Advised that if that does not work in the future we can send in prescription to [facility]  Follow-up in 6 months or sooner if needed   Ward Givens, MSN, NP-C 10/19/2021, 10:56 AM [facility] [address], [place], [place] [address] [phone]

## 2021-10-19 NOTE — Patient Instructions (Addendum)
Your Plan:  Continue Lyrica 150 mg twice a day Consider nortriptyline at bedtime for neuropathy  If your symptoms worsen or you develop new symptoms please let us know.   Nortriptyline Capsules What is this medication? NORTRIPTYLINE (nor TRIP ti leen) treats depression. It increases the amount of serotonin and norepinephrine in the brain, substances that help regulate mood. It belongs to a group of medications called tricyclic antidepressants (TCAs). This medicine may be used for other purposes; ask your health care provider or pharmacist if you have questions. COMMON BRAND NAME(S): Aventyl, Pamelor What should I tell my care team before I take this medication? They need to know if you have any of these conditions: Bipolar disorder Brugada syndrome Difficulty passing urine Glaucoma Heart disease If you drink alcohol Liver disease Schizophrenia Seizures Suicidal thoughts, plans or attempt; a previous suicide attempt by you or a family member Thyroid disease An unusual or allergic reaction to nortriptyline, other tricyclic antidepressants, other medications, foods, dyes, or preservatives Pregnant or trying to get pregnant Breast-feeding How should I use this medication? Take this medication by mouth with a glass of water. Follow the directions on the prescription label. Take your doses at regular intervals. Do not take it more often than directed. Do not stop taking this medication suddenly except upon the advice of your care team. Stopping this medication too quickly may cause serious side effects or your condition may worsen. A special MedGuide will be given to you by the pharmacist with each prescription and refill. Be sure to read this information carefully each time. Talk to your care team about the use of this medication in children. Special care may be needed. Overdosage: If you think you have taken too much of this medicine contact a poison control center or emergency room at  once. NOTE: This medicine is only for you. Do not share this medicine with others. What if I miss a dose? If you miss a dose, take it as soon as you can. If it is almost time for your next dose, take only that dose. Do not take double or extra doses. What may interact with this medication? Do not take this medication with any of the following: Cisapride Dronedarone Linezolid MAOIs like Carbex, Eldepryl, Marplan, Nardil, and Parnate Methylene blue (injected into a vein) Pimozide Thioridazine This medication may also interact with the following: Alcohol Antihistamines for allergy, cough, and cold Atropine Certain medications for bladder problems like oxybutynin, tolterodine Certain medications for depression like amitriptyline, fluoxetine, sertraline Certain medications for Parkinson's disease like benztropine, trihexyphenidyl Certain medications for stomach problems like dicyclomine, hyoscyamine Certain medications for travel sickness like scopolamine Chlorpropamide Cimetidine Ipratropium Other medications that prolong the QT interval (an abnormal heart rhythm) like dofetilide Other medications that can cause serotonin syndrome like St. John's Wort, fentanyl, lithium, tramadol, tryptophan, buspirone, and some medications for headaches like sumatriptan or rizatriptan Quinidine Reserpine Thyroid medication This list may not describe all possible interactions. Give your health care provider a list of all the medicines, herbs, non-prescription drugs, or dietary supplements you use. Also tell them if you smoke, drink alcohol, or use illegal drugs. Some items may interact with your medicine. What should I watch for while using this medication? Tell your care team if your symptoms do not get better or if they get worse. Visit your care team for regular checks on your progress. Because it may take several weeks to see the full effects of this medication, it is important to continue your  treatment  as prescribed by your care team. Patients and their families should watch out for new or worsening thoughts of suicide or depression. Also watch out for sudden changes in feelings such as feeling anxious, agitated, panicky, irritable, hostile, aggressive, impulsive, severely restless, overly excited and hyperactive, or not being able to sleep. If this happens, especially at the beginning of treatment or after a change in dose, call your care team. You may get drowsy or dizzy. Do not drive, use machinery, or do anything that needs mental alertness until you know how this medication affects you. Do not stand or sit up quickly, especially if you are an older patient. This reduces the risk of dizzy or fainting spells. Alcohol may interfere with the effect of this medication. Avoid alcoholic drinks. Do not treat yourself for coughs, colds, or allergies without asking your care team for advice. Some ingredients can increase possible side effects. Your mouth may get dry. Chewing sugarless gum or sucking hard candy, and drinking plenty of water may help. Contact your care team if the problem does not go away or is severe. This medication may cause dry eyes and blurred vision. If you wear contact lenses you may feel some discomfort. Lubricating drops may help. See your eye doctor if the problem does not go away or is severe. This medication can cause constipation. Try to have a bowel movement at least every 2 to 3 days. If you do not have a bowel movement for 3 days, call your care team. This medication can make you more sensitive to the sun. Keep out of the sun. If you cannot avoid being in the sun, wear protective clothing and use sunscreen. Do not use sun lamps or tanning beds/booths. What side effects may I notice from receiving this medication? Side effects that you should report to your care team as soon as possible: Allergic reactions--skin rash, itching, hives, swelling of the face, lips, tongue,  or throat Heart rhythm changes--fast or irregular heartbeat, dizziness, feeling faint or lightheaded, chest pain, trouble breathing Irritability, confusion, fast or irregular heartbeat, muscle stiffness, twitching muscles, sweating, high fever, seizure, chills, vomiting, diarrhea, which may be signs of serotonin syndrome Seizures Sudden eye pain or change in vision such as blurry vision, seeing halos around lights, vision loss Thoughts of suicide or self-harm, worsening mood, feelings of depression Trouble passing urine Side effects that usually do not require medical attention (report to your care team if they continue or are bothersome): Change in sex drive or performance Constipation Dizziness Drowsiness Dry mouth Tremors or shaking This list may not describe all possible side effects. Call your doctor for medical advice about side effects. You may report side effects to FDA at 1-800-FDA-1088. Where should I keep my medication? Keep out of the reach of children. Store at room temperature between 15 and 30 degrees C (59 and 86 degrees F). Keep container tightly closed. Throw away any unused medication after the expiration date. NOTE: This sheet is a summary. It may not cover all possible information. If you have questions about this medicine, talk to your doctor, pharmacist, or health care provider.  2023 Elsevier/Gold Standard (2020-11-12 00:00:00)   Thank you for coming to see [name] at [facility]. I hope we have been able to provide you high quality care today.  You may receive a patient satisfaction survey over the next few weeks. We would appreciate your feedback and comments so that we may continue to improve ourselves and the health of our patients.

## 2021-11-04 DIAGNOSIS — G4733 Obstructive sleep apnea (adult) (pediatric): Secondary | ICD-10-CM | POA: Diagnosis not present

## 2021-11-11 DIAGNOSIS — G4733 Obstructive sleep apnea (adult) (pediatric): Secondary | ICD-10-CM | POA: Diagnosis not present

## 2021-11-24 DIAGNOSIS — I4891 Unspecified atrial fibrillation: Secondary | ICD-10-CM | POA: Diagnosis not present

## 2021-12-05 DIAGNOSIS — G4733 Obstructive sleep apnea (adult) (pediatric): Secondary | ICD-10-CM | POA: Diagnosis not present

## 2021-12-11 DIAGNOSIS — G4733 Obstructive sleep apnea (adult) (pediatric): Secondary | ICD-10-CM | POA: Diagnosis not present

## 2021-12-30 NOTE — Progress Notes (Deleted)
Office Visit Note  Patient: Willie Daniel             Date of Birth: [birth year]           MRN: [id number]             PCP: Sharilyn Sites, MD Referring: Sharilyn Sites, MD Visit Date: 01/13/2022   Subjective:  No chief complaint on file.   History of Present Illness: Willie Daniel is a 51 y.o. male here for follow up or chronic, erosive, tophaceous, polyarticular gout   Previous HPI 07/14/2021 Willie Daniel is a 51 y.o. male here for follow up for chronic, erosive, tophaceous, polyarticular gout on colchicine 0.6 mg PO daily and febuxostat 80 mg PO daily. Overall he is doing well he continues to experience intermittent flare ups of joint pain but better most of the time. Called in for repeat steroid treatment 4/11. He noticed increased stiffness and catching or locking in his right index finger usually first thing in the morning.   Previous HPI 10/06/2020 Willie Daniel is a 51 y.o. male here for follow up for chronic, erosive, tophaceous, polyarticular gout on colchicine 0.6 mg PO daily and febuxostat 40 mg PO daily.  He feels overall symptoms are doing reasonably well since her last visit.  He has had about 2-3 flareups of increased joint pain most recently on the right side did improve after about 2 days of taking colchicine.  He was vacationing in [place] recently able to walk around the park all day without interruption or exacerbation of knee arthritis.  This is question about any alternative options or pain options due to the high monthly price of Uloric prescription.   08/21/20 Willie Daniel is a 51 y.o. male here for follow up for chronic, erosive, tophaceous, polyarticular gout now on uloric 40 mg PO daily, colchicine 0.6 mg PO daily, and finishing a 2 week prednisone taper for recent flare. After steroid shot and starting prednisone his symptoms are doing well today, able to walk unaided and fully extend joints and swelling improved. No noticeable side effects starting the  uloric.   08/12/20 Willie Daniel is a 51 y.o. male here for follow up for chronic, erosive gouty arthritis currently on allopurinol 600 mg PO daily and colchicine 0.50m PO daily. Since his last visit symptoms remain severe with pain pretty much every day. Currently experiencing this with his right shoulder, left elbow, right knee, and left foot. His hand swelling is somewhat less than previous. He borrowed some colchicine tablets and thinks these are slightly more effective than the capsules he had been taking. Symptoms are overall very problematic interfering with daily activity. Workup at last visit demonstrated erosive changes in multiple joints. Uric acid was remaining at goal since end of last year without any reduction in symptoms.   07/15/20 Willie DEGROOTEis a 51y.o. male here for evaluation of osteoarthritis, gouty arthritis, with chronic left knee pain.  He has had chronic joint pains and episodic gout flares in numerous areas going back more than 10 years with involvement in his feet knees elbows hands and possibly shoulders.  He also has some known osteoarthritis in joints and was recently seen by orthopedic surgery clinic with steroid injection of the right knee and hyaluronic acid supplementation injection of the left knee.  He has had a previous left knee arthroscopy in 2014.  His gout has been problematic for a long time [facility]  [facility] issue during the past approximately 1.5 years he was titrated to a high dose of allopurinol with as needed colchicine use and achieved a uric acid goal of less than 6.  However despite this he has had a more gout pain and inflammation in the past 6 months than ever before.  He was previously recommended against taking prophylactic colchicine daily with what sounds like some liver or renal function changes on labs and also having chronic diarrhea from the medicine.  For recent flares he received a steroid taper prescription from his  PCP that was much more beneficial for the symptoms flare this was around March. He also thinks that trying the colchicine tablets was more effective than capsules for recent flares. He is concerned about lack of disease control despite good uric acid level and stopping all alcohol use and red meats and shellfish and very limited soft drink use and wonders if different treatments might be more beneficial.   No Rheumatology ROS completed.   PMFS History:  Patient Active Problem List   Diagnosis Date Noted   Neuropathy 08/27/2020   Bilateral primary osteoarthritis of knee 07/15/2020   Benign essential hypertension 06/19/2020   Chronic gouty arthritis 06/19/2020   Chronic kidney disease 06/19/2020   Hypothyroidism 06/19/2020   Idiopathic peripheral neuropathy 78/93/8101   Chronic systolic CHF (congestive heart failure) ([place]) 01/29/2019   History of cardioversion 07/21/2017   Atrial flutter with rapid ventricular response ([place]) 07/21/2017   AVNRT (AV nodal re-entry tachycardia) 05/26/2017   Nocturnal hypoxemia 07/15/2016   OSA on CPAP 07/17/2015   Atrial flutter ([place]) 04/10/2015   L-loop transposition of great arteries 10/12/2011    Past Medical History:  Diagnosis Date   Chronic gouty arthritis    Hypertension    Neuropathy    OSA on CPAP 07/17/2015   Osteoarthritis     Family History  Problem Relation Age of Onset   Multiple myeloma Mother    Healthy Brother    Healthy Brother    Healthy Daughter    Healthy Daughter    Healthy Son    Sleep apnea Neg Hx    Past Surgical History:  Procedure Laterality Date   [place]   age 26 and age 89 at [place] 04/14/2015   Procedure: Cardioversion;  Surgeon: Deboraha Sprang, MD;  Location: [place] [facility];  Service: Cardiovascular;  Laterality: N/A;   I & D EXTREMITY  2004   right leg infection   KNEE ARTHROSCOPY WITH MEDIAL MENISECTOMY Left 12/14/2012   Procedure: KNEE ARTHROSCOPY WITH PARTIAL  MEDIAL AND LATERAL MENISECTOMY EXTENSIVE SYNOVECTOMY REMOVAL NODULE LEFT KNEE;  Surgeon: Ninetta Lights, MD;  Location: [place];  Service: Orthopedics;  Laterality: Left;   TEE WITHOUT CARDIOVERSION N/A 04/14/2015   Procedure: TRANSESOPHAGEAL ECHOCARDIOGRAM (TEE);  Surgeon: Josue Hector, MD;  Location: [facility];  Service: Cardiovascular;  Laterality: N/A;   VASECTOMY  2010   general anesth   Social History   Social History Narrative   Lives at home with wife and kids   Caffeine use: Tea/soda occass (2-3 per week)   Immunization History  Administered Date(s) Administered   PFIZER(Purple Top)SARS-COV-2 Vaccination 06/04/2019     Objective: Vital Signs: There were no vitals taken for this visit.   Physical Exam   Musculoskeletal Exam: ***  CDAI Exam: CDAI Score: -- Patient Global: --; Provider Global: -- Swollen: --; Tender: -- Joint Exam 01/13/2022   No  joint exam has been documented for this visit   There is currently no information documented on the homunculus. Go to the Rheumatology activity and complete the homunculus joint exam.  Investigation: No additional findings.  Imaging: No results found.  Recent Labs: Lab Results  Component Value Date   WBC 9.2 08/21/2020   HGB 13.7 08/21/2020   PLT 356 08/21/2020   NA 140 07/14/2021   K 4.3 07/14/2021   CL 99 07/14/2021   CO2 31 07/14/2021   GLUCOSE 97 07/14/2021   BUN 25 07/14/2021   CREATININE 1.57 (H) 07/14/2021   BILITOT 0.9 10/06/2020   AST 20 10/06/2020   ALT 20 10/06/2020   PROT 6.8 05/27/2021   CALCIUM 10.4 (H) 07/14/2021   GFRAA 76 08/21/2020    Speciality Comments: No specialty comments available.  Procedures:  No procedures performed Allergies: Patient has no known allergies.   Assessment / Plan:     Visit Diagnoses: No diagnosis found.  ***  Orders: No orders of the defined types were placed in this encounter.  No orders of the defined types were placed in this  encounter.    Follow-Up Instructions: No follow-ups on file.   Bertram Savin, RT  Note - This record has been created using Editor, commission[employer].  Chart creation errors have been sought, but may not always  have been located. Such creation errors do not reflect on  the standard of medical care.

## 2022-01-04 DIAGNOSIS — G4733 Obstructive sleep apnea (adult) (pediatric): Secondary | ICD-10-CM | POA: Diagnosis not present

## 2022-01-11 DIAGNOSIS — G4733 Obstructive sleep apnea (adult) (pediatric): Secondary | ICD-10-CM | POA: Diagnosis not present

## 2022-01-13 ENCOUNTER — Ambulatory Visit: Payer: BC Managed Care – PPO | Attending: Internal Medicine | Admitting: Internal Medicine

## 2022-01-13 DIAGNOSIS — M1A00X0 Idiopathic chronic gout, unspecified site, without tophus (tophi): Secondary | ICD-10-CM

## 2022-01-13 DIAGNOSIS — N189 Chronic kidney disease, unspecified: Secondary | ICD-10-CM

## 2022-01-13 DIAGNOSIS — I5022 Chronic systolic (congestive) heart failure: Secondary | ICD-10-CM

## 2022-01-14 DIAGNOSIS — I5022 Chronic systolic (congestive) heart failure: Secondary | ICD-10-CM | POA: Diagnosis not present

## 2022-01-14 DIAGNOSIS — Z86711 Personal history of pulmonary embolism: Secondary | ICD-10-CM | POA: Diagnosis not present

## 2022-01-14 DIAGNOSIS — Z79890 Hormone replacement therapy (postmenopausal): Secondary | ICD-10-CM | POA: Diagnosis not present

## 2022-01-14 DIAGNOSIS — Q203 Discordant ventriculoarterial connection: Secondary | ICD-10-CM | POA: Diagnosis not present

## 2022-01-14 DIAGNOSIS — Z7901 Long term (current) use of anticoagulants: Secondary | ICD-10-CM | POA: Diagnosis not present

## 2022-01-14 DIAGNOSIS — Q221 Congenital pulmonary valve stenosis: Secondary | ICD-10-CM | POA: Diagnosis not present

## 2022-01-14 DIAGNOSIS — Z7982 Long term (current) use of aspirin: Secondary | ICD-10-CM | POA: Diagnosis not present

## 2022-01-14 DIAGNOSIS — Z79899 Other long term (current) drug therapy: Secondary | ICD-10-CM | POA: Diagnosis not present

## 2022-01-26 ENCOUNTER — Other Ambulatory Visit: Payer: Self-pay | Admitting: Internal Medicine

## 2022-01-26 DIAGNOSIS — M1A9XX0 Chronic gout, unspecified, without tophus (tophi): Secondary | ICD-10-CM

## 2022-01-26 DIAGNOSIS — M1A00X0 Idiopathic chronic gout, unspecified site, without tophus (tophi): Secondary | ICD-10-CM

## 2022-01-29 ENCOUNTER — Other Ambulatory Visit: Payer: Self-pay | Admitting: Internal Medicine

## 2022-01-29 ENCOUNTER — Encounter: Payer: Self-pay | Admitting: Internal Medicine

## 2022-01-29 DIAGNOSIS — M1A00X0 Idiopathic chronic gout, unspecified site, without tophus (tophi): Secondary | ICD-10-CM

## 2022-01-29 DIAGNOSIS — M1A9XX0 Chronic gout, unspecified, without tophus (tophi): Secondary | ICD-10-CM

## 2022-02-01 ENCOUNTER — Encounter: Payer: Self-pay | Admitting: Internal Medicine

## 2022-02-01 ENCOUNTER — Ambulatory Visit: Payer: BC Managed Care – PPO | Attending: Internal Medicine | Admitting: Internal Medicine

## 2022-02-01 VITALS — BP 100/61 | HR 65 | Resp 15 | Ht 73.0 in | Wt 311.0 lb

## 2022-02-01 DIAGNOSIS — M1A00X0 Idiopathic chronic gout, unspecified site, without tophus (tophi): Secondary | ICD-10-CM | POA: Diagnosis not present

## 2022-02-01 MED ORDER — FEBUXOSTAT 80 MG PO TABS: 1.0000 | ORAL_TABLET | Freq: Every day | ORAL | 1 refills | Status: DC

## 2022-02-01 NOTE — Progress Notes (Unsigned)
Office Visit Note  Patient: Willie Daniel             Date of Birth: 1970-10-16           MRN: [id number]             PCP: Sharilyn Sites, MD Referring: Sharilyn Sites, MD Visit Date: 02/01/2022   Subjective:  Gout (Follow up, medication refill, a few flares)   History of Present Illness: Willie Daniel is a 51 y.o. male here for gout follow up on uloric 80 mg daily. Uric acid at last visit was 9.3. Interal BMP with serum creatinine 1.7 slightly worse than before. He has had a few flare ups, most noticeable in the right ankle and afterwards a new nodule on the back of his ankle. This is not particularly tender but does have some skin irritation with friction on his sock and shoe. Also slightly worse trouble tightly closing his right knuckles.  Previous HPI 07/14/21 Willie Daniel is a 51 y.o. male here for follow up for chronic, erosive, tophaceous, polyarticular gout on colchicine 0.6 mg PO daily and febuxostat 80 mg PO daily. Overall he is doing well he continues to experience intermittent flare ups of joint pain but better most of the time. Called in for repeat steroid treatment 4/11. He noticed increased stiffness and catching or locking in his right index finger usually first thing in the morning.   07/15/20 Willie Daniel is a 51 y.o. male here for evaluation of osteoarthritis, gouty arthritis, with chronic left knee pain.  He has had chronic joint pains and episodic gout flares in numerous areas going back more than 10 years with involvement in his feet knees elbows hands and possibly shoulders.  He also has some known osteoarthritis in joints and was recently seen by orthopedic surgery clinic with steroid injection of the right knee and hyaluronic acid supplementation injection of the left knee.  He has had a previous left knee arthroscopy in 2014.  His gout has been problematic for a long time he is seeing [facility]ology for this issue during the past approximately 1.5 years  he was titrated to a high dose of allopurinol with as needed colchicine use and achieved a uric acid goal of less than 6.  However despite this he has had a more gout pain and inflammation in the past 6 months than ever before.  He was previously recommended against taking prophylactic colchicine daily with what sounds like some liver or renal function changes on labs and also having chronic diarrhea from the medicine.  For recent flares he received a steroid taper prescription from his PCP that was much more beneficial for the symptoms flare this was around March. He also thinks that trying the colchicine tablets was more effective than capsules for recent flares. He is concerned about lack of disease control despite good uric acid level and stopping all alcohol use and red meats and shellfish and very limited soft drink use and wonders if different treatments might be more beneficial.  Review of Systems  Constitutional:  Positive for fatigue.  HENT:  Positive for mouth dryness. Negative for mouth sores.   Eyes:  Positive for dryness.  Respiratory:  Positive for shortness of breath.   Cardiovascular:  Negative for chest pain and palpitations.  Gastrointestinal:  Negative for blood in stool, constipation and diarrhea.  Endocrine: Negative for increased urination.  Genitourinary:  Negative for involuntary urination.  Musculoskeletal:  Positive for joint pain,  gait problem, joint pain, joint swelling, myalgias, morning stiffness, muscle tenderness and myalgias. Negative for muscle weakness.  Skin:  Positive for rash. Negative for color change, hair loss and sensitivity to sunlight.  Allergic/Immunologic: Negative for susceptible to infections.  Neurological:  Positive for dizziness. Negative for headaches.  Hematological:  Negative for swollen glands.  Psychiatric/Behavioral:  Negative for depressed mood and sleep disturbance. The patient is nervous/anxious.     PMFS History:  Patient Active  Problem List   Diagnosis Date Noted   Neuropathy 08/27/2020   Bilateral primary osteoarthritis of knee 07/15/2020   Benign essential hypertension 06/19/2020   Chronic gouty arthritis 06/19/2020   Chronic kidney disease 06/19/2020   Hypothyroidism 06/19/2020   Idiopathic peripheral neuropathy 94/80/1655   Chronic systolic CHF (congestive heart failure) ([place]) 01/29/2019   History of cardioversion 07/21/2017   Atrial flutter with rapid ventricular response ([place]) 07/21/2017   AVNRT (AV nodal re-entry tachycardia) 05/26/2017   Nocturnal hypoxemia 07/15/2016   OSA on CPAP 07/17/2015   Atrial flutter ([place]) 04/10/2015   L-loop transposition of great arteries 10/12/2011    Past Medical History:  Diagnosis Date   Chronic gouty arthritis    Hypertension    Neuropathy    OSA on CPAP 07/17/2015   Osteoarthritis     Family History  Problem Relation Age of Onset   Multiple myeloma Mother    Healthy Brother    Healthy Brother    Healthy Daughter    Healthy Daughter    Healthy Son    Sleep apnea Neg Hx    Past Surgical History:  Procedure Laterality Date   [place]   age 74 and age 79 at [place] 04/14/2015   Procedure: Cardioversion;  Surgeon: Deboraha Sprang, MD;  Location: [place] [facility];  Service: Cardiovascular;  Laterality: N/A;   I & D EXTREMITY  2004   right leg infection   KNEE ARTHROSCOPY WITH MEDIAL MENISECTOMY Left 12/14/2012   Procedure: KNEE ARTHROSCOPY WITH PARTIAL MEDIAL AND LATERAL MENISECTOMY EXTENSIVE SYNOVECTOMY REMOVAL NODULE LEFT KNEE;  Surgeon: Ninetta Lights, MD;  Location: [place];  Service: Orthopedics;  Laterality: Left;   TEE WITHOUT CARDIOVERSION N/A 04/14/2015   Procedure: TRANSESOPHAGEAL ECHOCARDIOGRAM (TEE);  Surgeon: Josue Hector, MD;  Location: [facility];  Service: Cardiovascular;  Laterality: N/A;   VASECTOMY  2010   general anesth   Social History   Social History Narrative   Lives at  home with wife and kids   Caffeine use: Tea/soda occass (2-3 per week)   Immunization History  Administered Date(s) Administered   PFIZER(Purple Top)SARS-COV-2 Vaccination 06/04/2019     Objective: Vital Signs: BP 100/61 (BP Location: Left Arm, Patient Position: Sitting, Cuff Size: Normal)   Pulse 65   Resp 15   Ht _0  (1.854 m)   Wt (!) 311 lb (141.1 kg)   BMI 41.03 kg/m    Physical Exam Constitutional:      Appearance: He is obese.  Cardiovascular:     Rate and Rhythm: Normal rate and regular rhythm.  Pulmonary:     Effort: Pulmonary effort is normal.     Breath sounds: Normal breath sounds.  Skin:    General: Skin is warm and dry.     Findings: No rash.  Neurological:     Mental Status: He is alert.  Psychiatric:        Mood and Affect: Mood normal.  Musculoskeletal Exam:  Elbows full ROM no tenderness slightly left olecranon bursa swelling Wrists full ROM no tenderness or swelling Fingers mild MCP joint tenderness slightly decreased flexion ROM right 2nd-3rd MCPs Knees full ROM no tenderness or swelling, patellofemoral crepitus bilaterally Ankles full ROM no tenderness or swelling, firm nodule on back of right achilles, mobile with tendon   Investigation: No additional findings.  Imaging: No results found.  Recent Labs: Lab Results  Component Value Date   WBC 9.2 08/21/2020   HGB 13.7 08/21/2020   PLT 356 08/21/2020   NA 140 07/14/2021   K 4.3 07/14/2021   CL 99 07/14/2021   CO2 31 07/14/2021   GLUCOSE 97 07/14/2021   BUN 25 07/14/2021   CREATININE 1.57 (H) 07/14/2021   BILITOT 0.9 10/06/2020   AST 20 10/06/2020   ALT 20 10/06/2020   PROT 6.8 05/27/2021   CALCIUM 10.4 (H) 07/14/2021   GFRAA 76 08/21/2020    Speciality Comments: No specialty comments available.  Procedures:  No procedures performed Allergies: Patient has no known allergies.   Assessment / Plan:     Visit Diagnoses: Chronic gouty arthritis  Orders: No orders of  the defined types were placed in this encounter.  No orders of the defined types were placed in this encounter.   Face-to-face time spent with patient was *** minutes. Greater than 50% of time was spent in counseling and coordination of care.  Follow-Up Instructions: No follow-ups on file.   Collier Salina, MD  Note - This record has been created using [employer].  Chart creation errors have been sought, but may not always  have been located. Such creation errors do not reflect on  the standard of medical care.

## 2022-02-02 ENCOUNTER — Encounter: Payer: Self-pay | Admitting: Internal Medicine

## 2022-02-02 DIAGNOSIS — K429 Umbilical hernia without obstruction or gangrene: Secondary | ICD-10-CM | POA: Diagnosis not present

## 2022-02-02 LAB — CBC WITH DIFFERENTIAL/PLATELET
Absolute Monocytes: 479 cells/uL (ref 200–950)
Basophils Absolute: 63 cells/uL (ref 0–200)
Basophils Relative: 1 %
Eosinophils Absolute: 139 cells/uL (ref 15–500)
Eosinophils Relative: 2.2 %
HCT: 40.1 % (ref 38.5–50.0)
Hemoglobin: 13.4 g/dL (ref 13.2–17.1)
Lymphs Abs: 2388 cells/uL (ref 850–3900)
MCH: 30.5 pg (ref 27.0–33.0)
MCHC: 33.4 g/dL (ref 32.0–36.0)
MCV: 91.1 fL (ref 80.0–100.0)
MPV: 11.7 fL (ref 7.5–12.5)
Monocytes Relative: 7.6 %
Neutro Abs: 3232 cells/uL (ref 1500–7800)
Neutrophils Relative %: 51.3 %
Platelets: 206 10*3/uL (ref 140–400)
RBC: 4.4 10*6/uL (ref 4.20–5.80)
RDW: 13 % (ref 11.0–15.0)
Total Lymphocyte: 37.9 %
WBC: 6.3 10*3/uL (ref 3.8–10.8)

## 2022-02-02 LAB — COMPLETE METABOLIC PANEL WITH GFR
AG Ratio: 1.6 (calc) (ref 1.0–2.5)
ALT: 20 U/L (ref 9–46)
AST: 21 U/L (ref 10–35)
Albumin: 4.5 g/dL (ref 3.6–5.1)
Alkaline phosphatase (APISO): 45 U/L (ref 35–144)
BUN/Creatinine Ratio: 18 (calc) (ref 6–22)
BUN: 32 mg/dL — ABNORMAL HIGH (ref 7–25)
CO2: 33 mmol/L — ABNORMAL HIGH (ref 20–32)
Calcium: 9.5 mg/dL (ref 8.6–10.3)
Chloride: 102 mmol/L (ref 98–110)
Creat: 1.82 mg/dL — ABNORMAL HIGH (ref 0.70–1.30)
Globulin: 2.9 g/dL (calc) (ref 1.9–3.7)
Glucose, Bld: 95 mg/dL (ref 65–99)
Potassium: 4.5 mmol/L (ref 3.5–5.3)
Sodium: 142 mmol/L (ref 135–146)
Total Bilirubin: 0.8 mg/dL (ref 0.2–1.2)
Total Protein: 7.4 g/dL (ref 6.1–8.1)
eGFR: 44 mL/min/{1.73_m2} — ABNORMAL LOW (ref >=60)

## 2022-02-02 LAB — URIC ACID: Uric Acid, Serum: 7.9 mg/dL (ref 4.0–8.0)

## 2022-02-02 MED ORDER — FEBUXOSTAT 80 MG PO TABS: 1.5000 | ORAL_TABLET | Freq: Every day | ORAL | Status: DC

## 2022-02-02 NOTE — Progress Notes (Signed)
Lab results look okay his creatinine is slightly increased from 1.57 to 1.82 this suggests a slight decrease in kidney function.  Uric acid remains elevated above our goal at 7.9.  I recommend he increase the Uloric dose like we discussed to 120 mg he can use the same tablets but take 1.5 daily.  Because this is a dose increase I recommend we change our follow-up schedule to 3 months to recheck.

## 2022-02-02 NOTE — Addendum Note (Signed)
Addended by: Fuller Plan on: 02/02/2022 08:11 AM   Modules accepted: Orders

## 2022-02-03 ENCOUNTER — Other Ambulatory Visit: Payer: Self-pay | Admitting: *Deleted

## 2022-02-03 DIAGNOSIS — M1A00X0 Idiopathic chronic gout, unspecified site, without tophus (tophi): Secondary | ICD-10-CM

## 2022-02-03 MED ORDER — FEBUXOSTAT 80 MG PO TABS: 1.5000 | ORAL_TABLET | Freq: Every day | ORAL | 2 refills | Status: DC

## 2022-02-17 ENCOUNTER — Other Ambulatory Visit: Payer: Self-pay | Admitting: Neurology

## 2022-02-18 NOTE — Telephone Encounter (Signed)
[facility] Verified LR: Qty:  Last OV: Pending appointment:

## 2022-02-20 ENCOUNTER — Encounter: Payer: Self-pay | Admitting: Adult Health

## 2022-02-22 NOTE — Telephone Encounter (Addendum)
Last visit 10/19/21 M. Millikan NP Next visit 05/03/22 M. Millikan NP  Per [place] registry, last filled #180/90 on 11/18/2021, prescribed by Dr Terrace Arabia

## 2022-02-24 MED ORDER — PREGABALIN 150 MG PO CAPS: 150.0000 mg | ORAL_CAPSULE | Freq: Two times a day (BID) | ORAL | 1 refills | Status: DC

## 2022-02-24 NOTE — Addendum Note (Signed)
Addended by: Enedina Finner on: 02/24/2022 10:17 AM   Modules accepted: Orders

## 2022-02-24 NOTE — Addendum Note (Signed)
Addended by: Enedina Finner on: 02/24/2022 04:38 PM   Modules accepted: Orders

## 2022-03-12 DIAGNOSIS — G4730 Sleep apnea, unspecified: Secondary | ICD-10-CM | POA: Diagnosis not present

## 2022-03-12 DIAGNOSIS — Z7982 Long term (current) use of aspirin: Secondary | ICD-10-CM | POA: Diagnosis not present

## 2022-03-12 DIAGNOSIS — Z6839 Body mass index (BMI) 39.0-39.9, adult: Secondary | ICD-10-CM | POA: Diagnosis not present

## 2022-03-12 DIAGNOSIS — M109 Gout, unspecified: Secondary | ICD-10-CM | POA: Diagnosis not present

## 2022-03-12 DIAGNOSIS — Q203 Discordant ventriculoarterial connection: Secondary | ICD-10-CM | POA: Diagnosis not present

## 2022-03-12 DIAGNOSIS — M1990 Unspecified osteoarthritis, unspecified site: Secondary | ICD-10-CM | POA: Diagnosis not present

## 2022-03-12 DIAGNOSIS — I5022 Chronic systolic (congestive) heart failure: Secondary | ICD-10-CM | POA: Diagnosis not present

## 2022-03-12 DIAGNOSIS — E039 Hypothyroidism, unspecified: Secondary | ICD-10-CM | POA: Diagnosis not present

## 2022-03-12 DIAGNOSIS — Z9989 Dependence on other enabling machines and devices: Secondary | ICD-10-CM | POA: Diagnosis not present

## 2022-03-12 DIAGNOSIS — I4892 Unspecified atrial flutter: Secondary | ICD-10-CM | POA: Diagnosis not present

## 2022-03-12 DIAGNOSIS — Z79899 Other long term (current) drug therapy: Secondary | ICD-10-CM | POA: Diagnosis not present

## 2022-03-12 DIAGNOSIS — K429 Umbilical hernia without obstruction or gangrene: Secondary | ICD-10-CM | POA: Diagnosis not present

## 2022-03-12 DIAGNOSIS — I4719 Other supraventricular tachycardia: Secondary | ICD-10-CM | POA: Diagnosis not present

## 2022-03-12 DIAGNOSIS — Z79890 Hormone replacement therapy (postmenopausal): Secondary | ICD-10-CM | POA: Diagnosis not present

## 2022-03-12 DIAGNOSIS — Z7901 Long term (current) use of anticoagulants: Secondary | ICD-10-CM | POA: Diagnosis not present

## 2022-03-12 HISTORY — PX: HERNIA REPAIR: SHX51

## 2022-03-26 DIAGNOSIS — Z4889 Encounter for other specified surgical aftercare: Secondary | ICD-10-CM | POA: Diagnosis not present

## 2022-03-26 DIAGNOSIS — K429 Umbilical hernia without obstruction or gangrene: Secondary | ICD-10-CM | POA: Diagnosis not present

## 2022-04-30 ENCOUNTER — Encounter: Payer: Self-pay | Admitting: Adult Health

## 2022-05-03 ENCOUNTER — Telehealth: Payer: Self-pay | Admitting: Adult Health

## 2022-05-03 ENCOUNTER — Ambulatory Visit: Payer: BC Managed Care – PPO | Admitting: Adult Health

## 2022-05-03 NOTE — Telephone Encounter (Signed)
Pt cancelled appt due to scheduling conflict

## 2022-05-04 DIAGNOSIS — G4733 Obstructive sleep apnea (adult) (pediatric): Secondary | ICD-10-CM | POA: Diagnosis not present

## 2022-05-05 NOTE — Progress Notes (Unsigned)
Office Visit Note  Patient: Willie Daniel             Date of Birth: 1970-05-11           MRN: [id number]             PCP: Sharilyn Sites, MD Referring: Sharilyn Sites, MD Visit Date: 05/06/2022   Subjective:  No chief complaint on file.   History of Present Illness: Willie Daniel is a 52 y.o. male here for follow up ***   Previous HPI 02/01/22 Willie Daniel is a 52 y.o. male here for gout follow up on uloric 80 mg daily. Uric acid at last visit was 9.3. Interal BMP with serum creatinine 1.7 slightly worse than before. He has had a few flare ups, most noticeable in the left ankle and afterwards a new nodule on the back of his ankle. This is not particularly tender but does have some skin irritation with friction on his sock and shoe. Also slightly worse trouble tightly closing his left knuckles.   Previous HPI 07/14/21 Willie Daniel is a 52 y.o. male here for follow up for chronic, erosive, tophaceous, polyarticular gout on colchicine 0.6 mg PO daily and febuxostat 80 mg PO daily. Overall he is doing well he continues to experience intermittent flare ups of joint pain but better most of the time. Called in for repeat steroid treatment 4/11. He noticed increased stiffness and catching or locking in his right index finger usually first thing in the morning.   07/15/20 Willie Daniel is a 52 y.o. male here for evaluation of osteoarthritis, gouty arthritis, with chronic left knee pain.  He has had chronic joint pains and episodic gout flares in numerous areas going back more than 10 years with involvement in his feet knees elbows hands and possibly shoulders.  He also has some known osteoarthritis in joints and was recently seen by orthopedic surgery clinic with steroid injection of the right knee and hyaluronic acid supplementation injection of the left knee.  He has had a previous left knee arthroscopy in 2014.  His gout has been problematic for a long time he is seeing [facility]  rheumatology for this issue during the past approximately 1.5 years he was titrated to a high dose of allopurinol with as needed colchicine use and achieved a uric acid goal of less than 6.  However despite this he has had a more gout pain and inflammation in the past 6 months than ever before.  He was previously recommended against taking prophylactic colchicine daily with what sounds like some liver or renal function changes on labs and also having chronic diarrhea from the medicine.  For recent flares he received a steroid taper prescription from his PCP that was much more beneficial for the symptoms flare this was around March. He also thinks that trying the colchicine tablets was more effective than capsules for recent flares. He is concerned about lack of disease control despite good uric acid level and stopping all alcohol use and red meats and shellfish and very limited soft drink use and wonders if different treatments might be more beneficial.   No Rheumatology ROS completed.   PMFS History:  Patient Active Problem List   Diagnosis Date Noted   Neuropathy 08/27/2020   Bilateral primary osteoarthritis of knee 07/15/2020   Benign essential hypertension 06/19/2020   Chronic gouty arthritis 06/19/2020   Chronic kidney disease 06/19/2020   Hypothyroidism 06/19/2020   Idiopathic peripheral neuropathy 03/04/2020  Chronic systolic CHF (congestive heart failure) ([employer]) 01/29/2019   History of cardioversion 07/21/2017   Atrial flutter with rapid ventricular response ([place]) 07/21/2017   AVNRT (AV nodal re-entry tachycardia) 05/26/2017   Nocturnal hypoxemia 07/15/2016   OSA on CPAP 07/17/2015   Atrial flutter ([place]) 04/10/2015   L-loop transposition of great arteries 10/12/2011    Past Medical History:  Diagnosis Date   Chronic gouty arthritis    Hypertension    Neuropathy    OSA on CPAP 07/17/2015   Osteoarthritis     Family History  Problem Relation Age of Onset   Multiple myeloma  Mother    Healthy Brother    Healthy Brother    Healthy Daughter    Healthy Daughter    Healthy Son    Sleep apnea Neg Hx    Past Surgical History:  Procedure Laterality Date   [place]   age 51 and age 59 at [place] 04/14/2015   Procedure: Cardioversion;  Surgeon: Deboraha Sprang, MD;  Location: [place] [facility];  Service: Cardiovascular;  Laterality: N/A;   I & D EXTREMITY  2004   right leg infection   KNEE ARTHROSCOPY WITH MEDIAL MENISECTOMY Left 12/14/2012   Procedure: KNEE ARTHROSCOPY WITH PARTIAL MEDIAL AND LATERAL MENISECTOMY EXTENSIVE SYNOVECTOMY REMOVAL NODULE LEFT KNEE;  Surgeon: Ninetta Lights, MD;  Location: [facility];  Service: Orthopedics;  Laterality: Left;   TEE WITHOUT CARDIOVERSION N/A 04/14/2015   Procedure: TRANSESOPHAGEAL ECHOCARDIOGRAM (TEE);  Surgeon: Josue Hector, MD;  Location: [facility] ENDOSCOPY;  Service: Cardiovascular;  Laterality: N/A;   VASECTOMY  2010   general anesth   Social History   Social History Narrative   Lives at home with wife and kids   Caffeine use: Tea/soda occass (2-3 per week)   Immunization History  Administered Date(s) Administered   PFIZER(Purple Top)SARS-COV-2 Vaccination 06/04/2019     Objective: Vital Signs: There were no vitals taken for this visit.   Physical Exam   Musculoskeletal Exam: ***  CDAI Exam: CDAI Score: -- Patient Global: --; Provider Global: -- Swollen: --; Tender: -- Joint Exam 05/06/2022   No joint exam has been documented for this visit   There is currently no information documented on the homunculus. Go to the Rheumatology activity and complete the homunculus joint exam.  Investigation: No additional findings.  Imaging: No results found.  Recent Labs: Lab Results  Component Value Date   WBC 6.3 02/01/2022   HGB 13.4 02/01/2022   PLT 206 02/01/2022   NA 142 02/01/2022   K 4.5 02/01/2022   CL 102 02/01/2022   CO2 33 (H) 02/01/2022    GLUCOSE 95 02/01/2022   BUN 32 (H) 02/01/2022   CREATININE 1.82 (H) 02/01/2022   BILITOT 0.8 02/01/2022   AST 21 02/01/2022   ALT 20 02/01/2022   PROT 7.4 02/01/2022   CALCIUM 9.5 02/01/2022   GFRAA 76 08/21/2020    Speciality Comments: No specialty comments available.  Procedures:  No procedures performed Allergies: Patient has no known allergies.   Assessment / Plan:     Visit Diagnoses: No diagnosis found.  ***  Orders: No orders of the defined types were placed in this encounter.  No orders of the defined types were placed in this encounter.    Follow-Up Instructions: No follow-ups on file.   Collier Salina, MD  Note - This record has been created using [employer].  Chart creation errors have  been sought, but may not always  have been located. Such creation errors do not reflect on  the standard of medical care.

## 2022-05-06 ENCOUNTER — Ambulatory Visit: Payer: BC Managed Care – PPO | Attending: Internal Medicine | Admitting: Internal Medicine

## 2022-05-06 ENCOUNTER — Encounter: Payer: Self-pay | Admitting: Internal Medicine

## 2022-05-06 VITALS — BP 107/69 | HR 63 | Resp 12 | Ht 74.0 in | Wt 301.0 lb

## 2022-05-06 DIAGNOSIS — N189 Chronic kidney disease, unspecified: Secondary | ICD-10-CM

## 2022-05-06 DIAGNOSIS — M170 Bilateral primary osteoarthritis of knee: Secondary | ICD-10-CM | POA: Diagnosis not present

## 2022-05-06 DIAGNOSIS — M1A00X0 Idiopathic chronic gout, unspecified site, without tophus (tophi): Secondary | ICD-10-CM | POA: Diagnosis not present

## 2022-05-07 LAB — SEDIMENTATION RATE: Sed Rate: 2 mm/h (ref 0–20)

## 2022-05-07 LAB — COMPLETE METABOLIC PANEL WITH GFR
AG Ratio: 1.4 (calc) (ref 1.0–2.5)
ALT: 20 U/L (ref 9–46)
AST: 20 U/L (ref 10–35)
Albumin: 4.4 g/dL (ref 3.6–5.1)
Alkaline phosphatase (APISO): 47 U/L (ref 35–144)
BUN/Creatinine Ratio: 17 (calc) (ref 6–22)
BUN: 27 mg/dL — ABNORMAL HIGH (ref 7–25)
CO2: 27 mmol/L (ref 20–32)
Calcium: 9.9 mg/dL (ref 8.6–10.3)
Chloride: 103 mmol/L (ref 98–110)
Creat: 1.61 mg/dL — ABNORMAL HIGH (ref 0.70–1.30)
Globulin: 3.1 g/dL (calc) (ref 1.9–3.7)
Glucose, Bld: 99 mg/dL (ref 65–99)
Potassium: 4.4 mmol/L (ref 3.5–5.3)
Sodium: 140 mmol/L (ref 135–146)
Total Bilirubin: 1 mg/dL (ref 0.2–1.2)
Total Protein: 7.5 g/dL (ref 6.1–8.1)
eGFR: 51 mL/min/{1.73_m2} — ABNORMAL LOW (ref >=60)

## 2022-05-07 LAB — URIC ACID: Uric Acid, Serum: 7.8 mg/dL (ref 4.0–8.0)

## 2022-05-07 MED ORDER — FEBUXOSTAT 80 MG PO TABS: 1.5000 | ORAL_TABLET | Freq: Every day | ORAL | 1 refills | Status: DC

## 2022-05-07 NOTE — Addendum Note (Signed)
Addended by: Collier Salina on: 05/07/2022 07:48 AM   Modules accepted: Orders

## 2022-05-07 NOTE — Progress Notes (Signed)
Uric acid remains slightly above goal 7.8. Continue the uloric 120 mg daily (1.5 tablets). That is the highest dose I recommend and is fine if not having flares.. No problems with kidney or liver function.

## 2022-05-13 ENCOUNTER — Telehealth: Payer: Self-pay | Admitting: *Deleted

## 2022-05-13 NOTE — Telephone Encounter (Signed)
Submitted a Prior Authorization request to [facility] for  Uloric  via CoverMyMeds. Will update once we receive a response.  Key: [id number]

## 2022-05-18 NOTE — Telephone Encounter (Signed)
Received notification from [facility] regarding a prior authorization for  Uloric . Authorization has been APPROVED from 05/13/2022 to 05/13/2023. Approval letter sent to scan center.  Reference # [id number]

## 2022-05-27 DIAGNOSIS — Q205 Discordant atrioventricular connection: Secondary | ICD-10-CM | POA: Diagnosis not present

## 2022-05-27 DIAGNOSIS — Z5181 Encounter for therapeutic drug level monitoring: Secondary | ICD-10-CM | POA: Diagnosis not present

## 2022-05-27 DIAGNOSIS — Z79899 Other long term (current) drug therapy: Secondary | ICD-10-CM | POA: Diagnosis not present

## 2022-05-31 ENCOUNTER — Encounter: Payer: Self-pay | Admitting: Internal Medicine

## 2022-05-31 ENCOUNTER — Other Ambulatory Visit: Payer: Self-pay | Admitting: *Deleted

## 2022-05-31 DIAGNOSIS — M1A00X0 Idiopathic chronic gout, unspecified site, without tophus (tophi): Secondary | ICD-10-CM

## 2022-05-31 MED ORDER — PREDNISONE 10 MG PO TABS: 10.0000 mg | ORAL_TABLET | Freq: Every day | ORAL | 0 refills | Status: DC | PRN

## 2022-05-31 NOTE — Telephone Encounter (Signed)
Last Fill: 10/05/2021  Next Visit: 11/04/2022  Last Visit: 05/06/2022  Dx: chronic gouty arthritis   Current Dose per office note on 05/06/2022: not mentioned  Okay to refill prednisone?

## 2022-06-02 DIAGNOSIS — G4733 Obstructive sleep apnea (adult) (pediatric): Secondary | ICD-10-CM | POA: Diagnosis not present

## 2022-06-04 ENCOUNTER — Other Ambulatory Visit: Payer: Self-pay | Admitting: Internal Medicine

## 2022-06-04 DIAGNOSIS — M1A00X0 Idiopathic chronic gout, unspecified site, without tophus (tophi): Secondary | ICD-10-CM

## 2022-06-16 ENCOUNTER — Ambulatory Visit: Payer: BC Managed Care – PPO | Admitting: Family Medicine

## 2022-06-16 ENCOUNTER — Ambulatory Visit: Payer: BC Managed Care – PPO | Admitting: Adult Health

## 2022-06-28 DIAGNOSIS — F630 Pathological gambling: Secondary | ICD-10-CM | POA: Diagnosis not present

## 2022-06-28 DIAGNOSIS — F411 Generalized anxiety disorder: Secondary | ICD-10-CM | POA: Diagnosis not present

## 2022-06-30 ENCOUNTER — Other Ambulatory Visit: Payer: Self-pay | Admitting: Internal Medicine

## 2022-06-30 DIAGNOSIS — M1A00X0 Idiopathic chronic gout, unspecified site, without tophus (tophi): Secondary | ICD-10-CM

## 2022-07-02 DIAGNOSIS — F411 Generalized anxiety disorder: Secondary | ICD-10-CM | POA: Diagnosis not present

## 2022-07-02 DIAGNOSIS — F630 Pathological gambling: Secondary | ICD-10-CM | POA: Diagnosis not present

## 2022-07-03 DIAGNOSIS — G4733 Obstructive sleep apnea (adult) (pediatric): Secondary | ICD-10-CM | POA: Diagnosis not present

## 2022-07-06 DIAGNOSIS — Z630 Problems in relationship with spouse or partner: Secondary | ICD-10-CM | POA: Diagnosis not present

## 2022-07-06 DIAGNOSIS — F411 Generalized anxiety disorder: Secondary | ICD-10-CM | POA: Diagnosis not present

## 2022-07-13 DIAGNOSIS — F411 Generalized anxiety disorder: Secondary | ICD-10-CM | POA: Diagnosis not present

## 2022-07-13 DIAGNOSIS — Z630 Problems in relationship with spouse or partner: Secondary | ICD-10-CM | POA: Diagnosis not present

## 2022-07-15 DIAGNOSIS — Z48812 Encounter for surgical aftercare following surgery on the circulatory system: Secondary | ICD-10-CM | POA: Diagnosis not present

## 2022-07-15 DIAGNOSIS — Q240 Dextrocardia: Secondary | ICD-10-CM | POA: Diagnosis not present

## 2022-07-15 DIAGNOSIS — I5022 Chronic systolic (congestive) heart failure: Secondary | ICD-10-CM | POA: Diagnosis not present

## 2022-07-15 DIAGNOSIS — Q203 Discordant ventriculoarterial connection: Secondary | ICD-10-CM | POA: Diagnosis not present

## 2022-07-15 DIAGNOSIS — Z8774 Personal history of (corrected) congenital malformations of heart and circulatory system: Secondary | ICD-10-CM | POA: Diagnosis not present

## 2022-07-21 DIAGNOSIS — F411 Generalized anxiety disorder: Secondary | ICD-10-CM | POA: Diagnosis not present

## 2022-07-21 DIAGNOSIS — Z630 Problems in relationship with spouse or partner: Secondary | ICD-10-CM | POA: Diagnosis not present

## 2022-07-28 ENCOUNTER — Other Ambulatory Visit: Payer: Self-pay

## 2022-07-28 ENCOUNTER — Telehealth: Payer: Self-pay | Admitting: Internal Medicine

## 2022-07-28 DIAGNOSIS — F411 Generalized anxiety disorder: Secondary | ICD-10-CM | POA: Diagnosis not present

## 2022-07-28 DIAGNOSIS — M1A00X0 Idiopathic chronic gout, unspecified site, without tophus (tophi): Secondary | ICD-10-CM

## 2022-07-28 DIAGNOSIS — Z630 Problems in relationship with spouse or partner: Secondary | ICD-10-CM | POA: Diagnosis not present

## 2022-07-28 MED ORDER — FEBUXOSTAT 80 MG PO TABS: 1.5000 | ORAL_TABLET | Freq: Every day | ORAL | 0 refills | Status: DC

## 2022-07-28 NOTE — Telephone Encounter (Signed)
Patient left a voicemail requesting his prescriptions be transferred from [facility] to his NEW PHARMACY - [facility] at [name] [place] in [place]

## 2022-07-29 ENCOUNTER — Telehealth: Payer: Self-pay

## 2022-07-29 NOTE — Telephone Encounter (Signed)
Received notification from CoverMyMeds regarding a prior authorization for Febuxostat. Authorization has been APPROVED from 07/29/2022 to 07/29/2023.    Authorization # No number given Phone # [phone]

## 2022-08-03 ENCOUNTER — Ambulatory Visit: Payer: BC Managed Care – PPO | Admitting: Internal Medicine

## 2022-08-11 ENCOUNTER — Ambulatory Visit (INDEPENDENT_AMBULATORY_CARE_PROVIDER_SITE_OTHER): Payer: BC Managed Care – PPO | Admitting: Adult Health

## 2022-08-11 ENCOUNTER — Encounter: Payer: Self-pay | Admitting: Adult Health

## 2022-08-11 VITALS — BP 100/63 | HR 64 | Ht 73.0 in | Wt 309.8 lb

## 2022-08-11 DIAGNOSIS — G609 Hereditary and idiopathic neuropathy, unspecified: Secondary | ICD-10-CM

## 2022-08-11 DIAGNOSIS — G4733 Obstructive sleep apnea (adult) (pediatric): Secondary | ICD-10-CM

## 2022-08-11 MED ORDER — PREGABALIN 150 MG PO CAPS: ORAL_CAPSULE | ORAL | 1 refills | Status: DC

## 2022-08-11 NOTE — Progress Notes (Addendum)
PATIENT: DIDIER BRANDENBURG DOB: 31-Mar-1970  REASON FOR VISIT: follow up HISTORY FROM: patient PRIMARY NEUROLOGIST: Dr. Vickey Huger  Chief Complaint  Patient presents with   Follow-up    Patient in room #8 and alone. Patient states he well and stable, no new concerns. ESS 10, FSS 42     HISTORY OF PRESENT ILLNESS:  Today 08/11/22:  BABAK LUCUS is a 52 y.o. male with a history of Peripheral Neuropathy and OSA on CPAP. Returns today for follow-up.   OSA on CPAP: Review CPAP download it showed good compliance good treatment of his apnea.  PN: Patient remains on Lyrica 150 mg twice a day. Uses a cream that his wife bought. He feels that its more mental for him- trying to get used to the discomfort- burning feet or they feel cold.   He states it is worse in the evening hours when he sits down to rest or to go to bed.  Does not notice much discomfort throughout the day.  In the past he has tried gabapentin and Cymbalta.  Has never tried nortriptyline but does have cardiac history.  He reports that he tolerates Lyrica well.  Does not feel that it makes him too drowsy.    10/19/21: Mr. Canepa is a 52 year old male with a history of peripheral neuropathy and obstructive sleep apnea on CPAP.  He returns today for follow-up.  OSA on CPAP: patient did not bring his SD card today. Unable to get DL. States that he uses it nightly. No new issues.   PN: Currently taking Lyrica 150 mg BID. Reports that lyrica has not been helpful. Reports that wife found neuropathic cream online and that has been helpful. During the day the pain is not that bad. Reports that its worse when he tries to go to bed.  Reports that the numbness bothers him the most.  He states that he can tolerate the pins and needle sensations that he currently has.  No changes with walking balance. Denies falls.    Blood pressure slightly lower today.  He is not symptomatic.  Patient has not eaten or drank much water this morning.   Encouraged him to make sure he is hydrated if he begins to have symptoms he should let his PCP know.  REVIEW OF SYSTEMS: Out of a complete 14 system review of symptoms, the patient complains only of the following symptoms, and all other reviewed systems are negative.  See HPI  ALLERGIES: No Known Allergies  HOME MEDICATIONS: Outpatient Medications Prior to Visit  Medication Sig Dispense Refill   Acetaminophen Extra Strength 500 MG TABS Take 2 tablets by mouth every 6 (six) hours.     apixaban (ELIQUIS) 5 MG TABS tablet Take by mouth 2 (two) times daily.      aspirin EC 81 MG tablet Take by mouth.     b complex vitamins capsule Take 1 capsule by mouth daily.     colchicine 0.6 MG tablet Take 1 tablet (0.6 mg total) by mouth as needed. 30 tablet 2   dofetilide (TIKOSYN) 250 MCG capsule Take 250 mcg by mouth 2 (two) times daily.     Febuxostat 80 MG TABS Take 1.5 tablets (120 mg total) by mouth daily. 135 tablet 0   fenofibrate 160 MG tablet Take by mouth.     levothyroxine (SYNTHROID) 50 MCG tablet Take 50 mcg by mouth daily.     metoprolol succinate (TOPROL-XL) 25 MG 24 hr tablet Take 25 mg  by mouth daily. (Patient not taking: Reported on 02/01/2022)     olmesartan (BENICAR) 20 MG tablet Take 20 mg by mouth daily.     potassium chloride SA (K-DUR) 20 MEQ tablet Take by mouth as needed.     predniSONE (DELTASONE) 10 MG tablet Take 1 tablet (10 mg total) by mouth daily as needed. 30 tablet 0   pregabalin (LYRICA) 150 MG capsule Take 1 capsule (150 mg total) by mouth 2 (two) times daily. As needed 180 capsule 1   torsemide (DEMADEX) 20 MG tablet Take 20 mg by mouth daily.     traMADol (ULTRAM) 50 MG tablet tramadol 50 mg tablet  Take 1 tablet every day by oral route as needed. (Patient not taking: Reported on 05/06/2022)     VITAMIN D PO Take by mouth.     VITAMIN E PO Take by mouth.     No facility-administered medications prior to visit.    PAST MEDICAL HISTORY: Past Medical History:   Diagnosis Date   Chronic gouty arthritis    Hypertension    Neuropathy    OSA on CPAP 07/17/2015   Osteoarthritis     PAST SURGICAL HISTORY: Past Surgical History:  Procedure Laterality Date   CARDIAC SURGERY  1976   age 3 and age 30 at [facility]   ELECTROPHYSIOLOGIC STUDY N/A 04/14/2015   Procedure: Cardioversion;  Surgeon: [facility] Salvia, MD;  Location[facility]IVE [facility] LAB;  Service: Cardiovascular;  Laterality: N/A;   HERNIA REPAIR  03/12/2022   I & D EXTREMITY  2004   right leg infection   KNEE ARTHROSCOPY WITH MEDIAL MENISECTOMY Left 12/14/2012   Procedure: KNEE ARTHROSCOPY WITH PARTIAL MEDIAL AND LATERAL MENISECTOMY EXTENSIVE SYNOVECTOMY REMOVAL NODULE LEFT KNEE;  Surgeon: Loreta Ave, MD;  Location: [facility];  Service: Orthopedics;  Laterality: Left;   TEE WITHOUT CARDIOVERSION N/A 04/14/2015   Procedure: TRANSESOPHAGEAL ECHOCARDIOGRAM (TEE);  Surgeon: Wendall Stade, MD;  Location: [facility] ENDOSCOPY;  Service: Cardiovascular;  Laterality: N/A;   VASECTOMY  2010   general anesth    FAMILY HISTORY: Family History  Problem Relation Age of Onset   Multiple myeloma Mother    Healthy Brother    Healthy Brother    Healthy Daughter    Healthy Daughter    Healthy Son    Sleep apnea Neg Hx     SOCIAL HISTORY: Social History   Socioeconomic History   Marital status: Married    Spouse name: Kimberley "Kim"   Number of children: 2   Years of education: 14   Highest education level: Not on file  Occupational History   Occupation: [employer]   Tobacco Use   Smoking status: Never    Passive exposure: Past   Smokeless tobacco: Never  Vaping Use   Vaping Use: Never used  Substance and Sexual Activity   Alcohol use: Not Currently   Drug use: No   Sexual activity: Not on file  Other Topics Concern   Not on file  Social History Narrative   Lives at home with wife and kids   Caffeine use: Tea/soda occass (2-3 per week)   Social Determinants of  Health   Financial Resource Strain: Not on file  Food Insecurity: Not on file  Transportation Needs: Not on file  Physical Activity: Not on file  Stress: Not on file  Social Connections: Not on file  Intimate Partner Violence: Not on file      PHYSICAL EXAM  Vitals:  08/11/22 1132  BP: 100/63  Pulse: 64  Weight: (!) 309 lb 12.8 oz (140.5 kg)  Height: 6\' 1"  (1.854 m)    Body mass index is 40.87 kg/m.  Generalized: Well developed, in no acute distress     Neurological examination  Mentation: Alert oriented to time, place, history taking. Follows all commands speech and language fluent Cranial nerve II-XII: Facial symmetry noted Motor: The motor testing reveals 5 over 5 strength of all 4 extremities. Good symmetric motor tone is noted throughout.  Gait and station: Gait is normal slightly wide based.    DIAGNOSTIC DATA (LABS, IMAGING, TESTING) - I reviewed patient records, labs, notes, testing and imaging myself where available.  Lab Results  Component Value Date   WBC 6.3 02/01/2022   HGB 13.4 02/01/2022   HCT 40.1 02/01/2022   MCV 91.1 02/01/2022   PLT 206 02/01/2022      Component Value Date/Time   NA 140 05/06/2022 1119   K 4.4 05/06/2022 1119   CL 103 05/06/2022 1119   CO2 27 05/06/2022 1119   GLUCOSE 99 05/06/2022 1119   BUN 27 (H) 05/06/2022 1119   CREATININE 1.61 (H) 05/06/2022 1119   CALCIUM 9.9 05/06/2022 1119   PROT 7.5 05/06/2022 1119   PROT 6.8 05/27/2021 1332   AST 20 05/06/2022 1119   ALT 20 05/06/2022 1119   BILITOT 1.0 05/06/2022 1119   GFRNONAA 66 08/21/2020 1233   GFRAA 76 08/21/2020 1233   Lab Results  Component Value Date   CHOL 137 04/11/2015   HDL 26 (L) 04/11/2015   LDLCALC 72 04/11/2015   TRIG 194 (H) 04/11/2015   CHOLHDL 5.3 04/11/2015   Lab Results  Component Value Date   HGBA1C 5.8 (H) 05/27/2021   Lab Results  Component Value Date   VITAMINB12 692 05/27/2021   Lab Results  Component Value Date   TSH 2.520  05/27/2021      ASSESSMENT AND PLAN 52 y.o. year old male  has a past medical history of Chronic gouty arthritis, Hypertension, Neuropathy, OSA on CPAP (07/17/2015), and Osteoarthritis. here with:  OSA on CPAP  -Good compliance and treatment of his apnea -Encouraged the patient continue using the CPAP nightly greater than 4 hours each night   2.  Peripheral neuropathy  -Increase Lyrica 150 mg to 3 times a day.  Advised that he can take 1 tablet in the morning and another tablet mid afternoon and then another tablet before bedtime -Continue to monitor symptoms  Follow-up in 6 months or sooner if needed   Butch Penny, MSN, NP-C 08/11/2022, 11:24 AM [facility] [address] [place], [place] [address] [phone]

## 2022-08-11 NOTE — Progress Notes (Addendum)
Order for pressure change sent to [facility]. Brad w/ [facility] confirmed receipt of order.

## 2022-08-16 ENCOUNTER — Telehealth: Payer: Self-pay | Admitting: *Deleted

## 2022-08-16 NOTE — Telephone Encounter (Signed)
Late entry from 08/11/22: pap order sent to [facility] and receipt of order was confirmed by Brad on 08/12/22.

## 2022-09-03 ENCOUNTER — Encounter: Payer: Self-pay | Admitting: Internal Medicine

## 2022-09-03 DIAGNOSIS — M1A00X0 Idiopathic chronic gout, unspecified site, without tophus (tophi): Secondary | ICD-10-CM

## 2022-09-03 MED ORDER — COLCHICINE 0.6 MG PO TABS
0.6000 mg | ORAL_TABLET | ORAL | 0 refills | Status: DC | PRN
Start: 2022-09-03 — End: 2022-11-04

## 2022-09-03 MED ORDER — PREDNISONE 10 MG PO TABS: 10.0000 mg | ORAL_TABLET | Freq: Every day | ORAL | 0 refills | Status: DC | PRN

## 2022-09-03 NOTE — Telephone Encounter (Signed)
Patient returned call to the office. Patient advised he is due to update his CBC. Patient states he will be back in town at the end of July and will come to update it then.

## 2022-09-03 NOTE — Telephone Encounter (Signed)
Medications are pended. Please advise.

## 2022-09-03 NOTE — Telephone Encounter (Signed)
Rx sent now. 

## 2022-09-03 NOTE — Telephone Encounter (Signed)
Patient contacted the office and states he would like a refill of Colchicine and Prednisone sent to [facility] in [place] on [name].   Last Fill: Colchicine 07/14/2021 Prednisone 05/31/2022  Labs: 02/01/2022 CBC 05/06/2022 Uric acid remains slightly above goal 7.8. Continue the uloric 120 mg daily (1.5 tablets). That is the highest dose I recommend and is fine if not having flares.. No problems with kidney or liver function.   Next Visit: 11/04/2022  Last Visit: 05/06/2022  DX:  Chronic gouty arthritis   Current Dose per office note 05/06/2022: not mentioned  Attempted to contact the patient to advise he has a lab due and left a message to call the office back.   Okay to refill Colchicine and Prednisone?

## 2022-10-19 DIAGNOSIS — G4733 Obstructive sleep apnea (adult) (pediatric): Secondary | ICD-10-CM | POA: Diagnosis not present

## 2022-10-21 NOTE — Progress Notes (Signed)
Office Visit Note  Patient: Willie Daniel             Date of Birth: 10-07-1970           MRN: [id number]             PCP: Assunta Found, MD Referring: Assunta Found, MD Visit Date: 11/04/2022   Subjective:  Follow-up (Doing good, some flares)   History of Present Illness: Willie Daniel is a 52 y.o. male here for follow up for chronic gouty arthritis on Uloric 120 mg daily with colchicine and prednisone as needed.  Last substantial flareup was in May involving his left ankle when he called in but even this resolved within about 5 days after starting the colchicine and took prednisone a few days.  Otherwise is not needing any anti-inflammatory medicine on a daily basis and no new large joint effusions or nodules.  He is also working on his diet by cutting out more sugars and increasing his level of exercise.  This is showing benefit with last hemoglobin A1c down to 5.5%.  He had recent labs checked for his primary care office with no significant change in kidney function.  Previous HPI 05/06/2022 Willie Daniel is a 52 y.o. male here for follow up for chronic gouty arthritis on Uloric dose was increased to 120 mg daily after last visit in November.  He has not suffered any major flareups since then or had to take prednisone.  He has had a couple episodes of increased pain and stiffness but took colchicine for only 1 or 2 days with improvement.  Had umbilical hernia repair surgery without complications.  He is starting to get back in the gym to work on increasing his exercise with a goal of weight loss.   Previous HPI 02/01/22 Willie Daniel is a 52 y.o. male here for gout follow up on uloric 80 mg daily. Uric acid at last visit was 9.3. Interal BMP with serum creatinine 1.7 slightly worse than before. He has had a few flare ups, most noticeable in the left ankle and afterwards a new nodule on the back of his ankle. This is not particularly tender but does have some skin irritation with  friction on his sock and shoe. Also slightly worse trouble tightly closing his left knuckles.   Previous HPI 07/14/21 Willie Daniel is a 52 y.o. male here for follow up for chronic, erosive, tophaceous, polyarticular gout on colchicine 0.6 mg PO daily and febuxostat 80 mg PO daily. Overall he is doing well he continues to experience intermittent flare ups of joint pain but better most of the time. Called in for repeat steroid treatment 4/11. He noticed increased stiffness and catching or locking in his right index finger usually first thing in the morning.   07/15/20 Willie Daniel is a 52 y.o. male here for evaluation of osteoarthritis, gouty arthritis, with chronic left knee pain.  He has had chronic joint pains and episodic gout flares in numerous areas going back more than 10 years with involvement in his feet knees elbows hands and possibly shoulders.  He also has some known osteoarthritis in joints and was recently seen by orthopedic surgery clinic with steroid injection of the right knee and hyaluronic acid supplementation injection of the left knee.  He has had a previous left knee arthroscopy in 2014.  His gout has been problematic for a long time he is seeing [facility] rheumatology for this issue during the past approximately  1.5 years he was titrated to a high dose of allopurinol with as needed colchicine use and achieved a uric acid goal of less than 6.  However despite this he has had a more gout pain and inflammation in the past 6 months than ever before.  He was previously recommended against taking prophylactic colchicine daily with what sounds like some liver or renal function changes on labs and also having chronic diarrhea from the medicine.  For recent flares he received a steroid taper prescription from his PCP that was much more beneficial for the symptoms flare this was around March. He also thinks that trying the colchicine tablets was more effective than capsules for recent flares.  He is concerned about lack of disease control despite good uric acid level and stopping all alcohol use and red meats and shellfish and very limited soft drink use and wonders if different treatments might be more beneficial.   Review of Systems  Constitutional:  Positive for fatigue.  HENT:  Positive for mouth dryness. Negative for mouth sores.   Eyes:  Positive for dryness.  Respiratory:  Positive for shortness of breath.   Cardiovascular:  Negative for chest pain and palpitations.  Gastrointestinal:  Negative for blood in stool, constipation and diarrhea.  Endocrine: Negative for increased urination.  Genitourinary:  Negative for involuntary urination.  Musculoskeletal:  Positive for joint pain, gait problem, joint pain, joint swelling and morning stiffness. Negative for myalgias, muscle weakness, muscle tenderness and myalgias.  Skin:  Negative for color change, rash, hair loss and sensitivity to sunlight.  Allergic/Immunologic: Negative for susceptible to infections.  Neurological:  Positive for dizziness. Negative for headaches.  Hematological:  Negative for swollen glands.  Psychiatric/Behavioral:  Positive for depressed mood. Negative for sleep disturbance. The patient is nervous/anxious.     PMFS History:  Patient Active Problem List   Diagnosis Date Noted   Neuropathy 08/27/2020   Bilateral primary osteoarthritis of knee 07/15/2020   Benign essential hypertension 06/19/2020   Chronic gouty arthritis 06/19/2020   Chronic kidney disease 06/19/2020   Hypothyroidism 06/19/2020   Idiopathic peripheral neuropathy 03/04/2020   Chronic systolic CHF (congestive heart failure) (HCC) 01/29/2019   History of cardioversion 07/21/2017   Atrial flutter with rapid ventricular response (HCC) 07/21/2017   AVNRT (AV nodal re-entry tachycardia) 05/26/2017   Nocturnal hypoxemia 07/15/2016   OSA on CPAP 07/17/2015   Atrial flutter (HCC) 04/10/2015   L-loop transposition of great arteries  10/12/2011    Past Medical History:  Diagnosis Date   Chronic gouty arthritis    Hypertension    Neuropathy    OSA on CPAP 07/17/2015   Osteoarthritis     Family History  Problem Relation Age of Onset   Multiple myeloma Mother    Healthy Brother    Healthy Brother    Healthy Daughter    Healthy Daughter    Healthy Son    Sleep apnea Neg Hx    Past Surgical History:  Procedure Laterality Date   CARDIAC SURGERY  1976   age 36 and age 40 at [facility]   ELECTROPHYSIOLOGIC STUDY N/A 04/14/2015   Procedure: Cardioversion;  Surgeon: [facility] Salvia, MD;  Location: [facility] INVASIVE CV LAB;  Service: Cardiovascular;  Laterality: N/A;   HERNIA REPAIR  03/12/2022   I & D EXTREMITY  2004   right leg infection   KNEE ARTHROSCOPY WITH MEDIAL MENISECTOMY Left 12/14/2012   Procedure: KNEE ARTHROSCOPY WITH PARTIAL MEDIAL AND LATERAL MENISECTOMY EXTENSIVE SYNOVECTOMY REMOVAL NODULE LEFT  KNEE;  Surgeon: Loreta Ave, MD;  Location: [facility];  Service: Orthopedics;  Laterality: Left;   TEE WITHOUT CARDIOVERSION N/A 04/14/2015   Procedure: TRANSESOPHAGEAL ECHOCARDIOGRAM (TEE);  Surgeon: Wendall Stade, MD;  Location: [facility] ENDOSCOPY;  Service: Cardiovascular;  Laterality: N/A;   VASECTOMY  2010   general anesth   Social History   Social History Narrative   Lives at home with wife and kids   Caffeine use: Tea/soda occass (2-3 per week)   Immunization History  Administered Date(s) Administered   PFIZER(Purple Top)SARS-COV-2 Vaccination 06/04/2019     Objective: Vital Signs: BP (!) 96/58 (BP Location: Right Arm, Patient Position: Sitting, Cuff Size: Normal)   Pulse 64   Resp 15   Ht 6\' 1"  (1.854 m)   Wt (!) 302 lb (137 kg)   BMI 39.84 kg/m    Physical Exam Constitutional:      Appearance: He is obese.  Eyes:     Conjunctiva/sclera: Conjunctivae normal.  Cardiovascular:     Rate and Rhythm: Normal rate and regular rhythm.     Comments: Dextorcardia Pulmonary:     Effort:  Pulmonary effort is normal.     Breath sounds: Normal breath sounds.  Musculoskeletal:     Right lower leg: Edema present.     Left lower leg: Edema present.  Skin:    General: Skin is warm and dry.  Neurological:     Mental Status: He is alert.  Psychiatric:        Mood and Affect: Mood normal.      Musculoskeletal Exam:  Elbows full ROM no tenderness mild left olecranon bursa swelling or soft tissue thickening Wrists full ROM no tenderness or swelling Fingers full ROM slightly decreased flexion range of motion at left second third MCP joints with no palpable swelling, slight finger trigger, no flexor tendon nodule palpable Knees full ROM no tenderness or swelling, crepitus present Left achilles tendon nodule, nontender Restricted flexion extension of first MTP joint on both feet, cocked up toe deformities worse on left foot  Investigation: No additional findings.  Imaging: No results found.  Recent Labs: Lab Results  Component Value Date   WBC 6.3 02/01/2022   HGB 13.4 02/01/2022   PLT 206 02/01/2022   NA 140 05/06/2022   K 4.4 05/06/2022   CL 103 05/06/2022   CO2 27 05/06/2022   GLUCOSE 99 05/06/2022   BUN 27 (H) 05/06/2022   CREATININE 1.61 (H) 05/06/2022   BILITOT 1.0 05/06/2022   AST 20 05/06/2022   ALT 20 05/06/2022   PROT 7.5 05/06/2022   CALCIUM 9.9 05/06/2022   GFRAA 76 08/21/2020    Speciality Comments: No specialty comments available.  Procedures:  No procedures performed Allergies: Patient has no known allergies.   Assessment / Plan:     Visit Diagnoses: Chronic gouty arthritis - Plan: colchicine 0.6 MG tablet, Febuxostat 80 MG TABS  Gout appears reasonably well-controlled although last uric acid test remained above goal at 7.8 only had 1 definite flare.  Left elbow nodule has not decreased any further not sure how much of this represents tophi versus soft tissue from chronic olecranon bursitis.  Plan to continue febuxostat 120 mg daily.  Can  continue the colchicine 0.6 mg prednisone 10 mg for use just as needed.  Bilateral primary osteoarthritis of knee  Known tricompartmental osteoarthritis and chronic meniscal tear as demonstrated on previous MRI.  Currently not in exacerbation and not severely activity limiting.  He is  on the Lyrica at 150 mg 3 times daily and taking Tylenol as needed.  Chronic kidney disease, unspecified CKD stage  Recent labs reviewed with normal blood count and metabolic panel with EGFR of 51 which is stable.   Orders: No orders of the defined types were placed in this encounter.  Meds ordered this encounter  Medications   colchicine 0.6 MG tablet    Sig: Take 1 tablet (0.6 mg total) by mouth as needed.    Dispense:  30 tablet    Refill:  1   Febuxostat 80 MG TABS    Sig: Take 1.5 tablets (120 mg total) by mouth daily.    Dispense:  135 tablet    Refill:  0     Follow-Up Instructions: Return in about 6 months (around 05/06/2023) for Gout on uloric/PRN colchicine&GC f/u 6mos.   Fuller Plan, MD  Note - This record has been created using [employer].  Chart creation errors have been sought, but may not always  have been located. Such creation errors do not reflect on  the standard of medical care.

## 2022-10-22 ENCOUNTER — Other Ambulatory Visit: Payer: Self-pay | Admitting: Internal Medicine

## 2022-10-22 DIAGNOSIS — M1A00X0 Idiopathic chronic gout, unspecified site, without tophus (tophi): Secondary | ICD-10-CM

## 2022-10-22 NOTE — Telephone Encounter (Signed)
Last Fill: 07/28/2022  Labs: 05/06/2022 Uric acid remains slightly above goal 7.8. Continue the uloric 120 mg daily (1.5 tablets).   Next Visit: 11/04/2022   Last Visit: 05/06/2022   DX: chronic gouty arthritis   Current Dose per office note 05/06/2022: Uloric dose at 120 mg daily.   Patient to update labs at upcoming appointment.   Okay to refill Uloric?

## 2022-10-26 DIAGNOSIS — Z1331 Encounter for screening for depression: Secondary | ICD-10-CM | POA: Diagnosis not present

## 2022-10-26 DIAGNOSIS — I4892 Unspecified atrial flutter: Secondary | ICD-10-CM | POA: Diagnosis not present

## 2022-10-26 DIAGNOSIS — R7309 Other abnormal glucose: Secondary | ICD-10-CM | POA: Diagnosis not present

## 2022-10-26 DIAGNOSIS — N183 Chronic kidney disease, stage 3 unspecified: Secondary | ICD-10-CM | POA: Diagnosis not present

## 2022-10-26 DIAGNOSIS — Z6839 Body mass index (BMI) 39.0-39.9, adult: Secondary | ICD-10-CM | POA: Diagnosis not present

## 2022-10-26 DIAGNOSIS — Z0001 Encounter for general adult medical examination with abnormal findings: Secondary | ICD-10-CM | POA: Diagnosis not present

## 2022-10-26 DIAGNOSIS — Q240 Dextrocardia: Secondary | ICD-10-CM | POA: Diagnosis not present

## 2022-10-26 DIAGNOSIS — Q205 Discordant atrioventricular connection: Secondary | ICD-10-CM | POA: Diagnosis not present

## 2022-10-26 DIAGNOSIS — E6609 Other obesity due to excess calories: Secondary | ICD-10-CM | POA: Diagnosis not present

## 2022-10-26 DIAGNOSIS — E039 Hypothyroidism, unspecified: Secondary | ICD-10-CM | POA: Diagnosis not present

## 2022-10-26 DIAGNOSIS — I370 Nonrheumatic pulmonary valve stenosis: Secondary | ICD-10-CM | POA: Diagnosis not present

## 2022-10-26 DIAGNOSIS — M1A00X0 Idiopathic chronic gout, unspecified site, without tophus (tophi): Secondary | ICD-10-CM | POA: Diagnosis not present

## 2022-11-04 ENCOUNTER — Encounter: Payer: Self-pay | Admitting: Internal Medicine

## 2022-11-04 ENCOUNTER — Ambulatory Visit: Payer: BC Managed Care – PPO | Attending: Internal Medicine | Admitting: Internal Medicine

## 2022-11-04 VITALS — BP 96/58 | HR 64 | Resp 15 | Ht 73.0 in | Wt 302.0 lb

## 2022-11-04 DIAGNOSIS — Z5181 Encounter for therapeutic drug level monitoring: Secondary | ICD-10-CM

## 2022-11-04 DIAGNOSIS — N189 Chronic kidney disease, unspecified: Secondary | ICD-10-CM

## 2022-11-04 DIAGNOSIS — M170 Bilateral primary osteoarthritis of knee: Secondary | ICD-10-CM

## 2022-11-04 DIAGNOSIS — M1A00X0 Idiopathic chronic gout, unspecified site, without tophus (tophi): Secondary | ICD-10-CM | POA: Diagnosis not present

## 2022-11-04 DIAGNOSIS — Z7952 Long term (current) use of systemic steroids: Secondary | ICD-10-CM

## 2022-11-04 MED ORDER — COLCHICINE 0.6 MG PO TABS
0.6000 mg | ORAL_TABLET | ORAL | 1 refills | Status: DC | PRN
Start: 2022-11-04 — End: 2023-02-15

## 2022-11-04 MED ORDER — FEBUXOSTAT 80 MG PO TABS
1.5000 | ORAL_TABLET | Freq: Every day | ORAL | 0 refills | Status: DC
Start: 2023-01-21 — End: 2023-04-19

## 2022-11-04 NOTE — Patient Instructions (Signed)

## 2022-11-18 ENCOUNTER — Other Ambulatory Visit: Payer: Self-pay | Admitting: Internal Medicine

## 2022-11-18 DIAGNOSIS — M1A00X0 Idiopathic chronic gout, unspecified site, without tophus (tophi): Secondary | ICD-10-CM

## 2022-11-18 DIAGNOSIS — M1A9XX0 Chronic gout, unspecified, without tophus (tophi): Secondary | ICD-10-CM

## 2022-11-19 DIAGNOSIS — G4733 Obstructive sleep apnea (adult) (pediatric): Secondary | ICD-10-CM | POA: Diagnosis not present

## 2022-12-07 DIAGNOSIS — I4892 Unspecified atrial flutter: Secondary | ICD-10-CM | POA: Diagnosis not present

## 2022-12-07 DIAGNOSIS — Q240 Dextrocardia: Secondary | ICD-10-CM | POA: Diagnosis not present

## 2022-12-07 DIAGNOSIS — I5022 Chronic systolic (congestive) heart failure: Secondary | ICD-10-CM | POA: Diagnosis not present

## 2022-12-07 DIAGNOSIS — Z5181 Encounter for therapeutic drug level monitoring: Secondary | ICD-10-CM | POA: Diagnosis not present

## 2022-12-07 DIAGNOSIS — Q205 Discordant atrioventricular connection: Secondary | ICD-10-CM | POA: Diagnosis not present

## 2022-12-07 DIAGNOSIS — Q203 Discordant ventriculoarterial connection: Secondary | ICD-10-CM | POA: Diagnosis not present

## 2022-12-07 DIAGNOSIS — Z79899 Other long term (current) drug therapy: Secondary | ICD-10-CM | POA: Diagnosis not present

## 2022-12-19 DIAGNOSIS — G4733 Obstructive sleep apnea (adult) (pediatric): Secondary | ICD-10-CM | POA: Diagnosis not present

## 2023-01-24 ENCOUNTER — Encounter: Payer: Self-pay | Admitting: Adult Health

## 2023-01-24 MED ORDER — PREGABALIN 150 MG PO CAPS: ORAL_CAPSULE | ORAL | 0 refills | Status: DC

## 2023-01-24 NOTE — Telephone Encounter (Signed)
I called [facility]. They are currently filling his Lyrica. It will be there this afternoon. I went ahead and sent the next refill request to [facility] NP for the pharmacy to have on file.  Last appt: 08/11/22 Next appt: 08/16/23  Last fill pending today. Previous refill on file states 09/03/22 #[id number]

## 2023-02-15 ENCOUNTER — Encounter: Payer: Self-pay | Admitting: Internal Medicine

## 2023-02-15 DIAGNOSIS — M1A9XX0 Chronic gout, unspecified, without tophus (tophi): Secondary | ICD-10-CM

## 2023-02-15 DIAGNOSIS — M1A00X0 Idiopathic chronic gout, unspecified site, without tophus (tophi): Secondary | ICD-10-CM

## 2023-02-15 MED ORDER — COLCHICINE 0.6 MG PO TABS
0.6000 mg | ORAL_TABLET | ORAL | 1 refills | Status: DC | PRN
Start: 2023-02-15 — End: 2023-11-04

## 2023-02-15 MED ORDER — PREDNISONE 10 MG PO TABS: 10.0000 mg | ORAL_TABLET | Freq: Every day | ORAL | 0 refills | Status: DC | PRN

## 2023-02-15 NOTE — Telephone Encounter (Signed)
Last Fill: Colchicine 11/04/2022 Prednisone 09/03/2022  Labs: 10/26/2022 ([facility] Tab)  Glucose 100 Creatinine 1.59 eGFR 51  05/06/2022 Uric Acid 7.8  Next Visit: 05/06/2023  Last Visit: 11/04/2022  DX: Chronic gouty arthritis   Current Dose per office note 11/04/2022: colchicine 0.6 mg prednisone 10 mg for use just as needed.   Contacted the patient to advise uric acid lab is due. Patient states he can come into the office to have a uric acid drawn. Please review and sign lab order.   Okay to refill Colchicine and Prednisone?

## 2023-03-16 DIAGNOSIS — Q203 Discordant ventriculoarterial connection: Secondary | ICD-10-CM | POA: Diagnosis not present

## 2023-03-16 DIAGNOSIS — Q210 Ventricular septal defect: Secondary | ICD-10-CM | POA: Diagnosis not present

## 2023-03-16 DIAGNOSIS — Q221 Congenital pulmonary valve stenosis: Secondary | ICD-10-CM | POA: Diagnosis not present

## 2023-03-16 DIAGNOSIS — I5022 Chronic systolic (congestive) heart failure: Secondary | ICD-10-CM | POA: Diagnosis not present

## 2023-03-17 DIAGNOSIS — M7541 Impingement syndrome of right shoulder: Secondary | ICD-10-CM | POA: Diagnosis not present

## 2023-03-21 DIAGNOSIS — M7541 Impingement syndrome of right shoulder: Secondary | ICD-10-CM | POA: Diagnosis not present

## 2023-03-21 DIAGNOSIS — S46011D Strain of muscle(s) and tendon(s) of the rotator cuff of right shoulder, subsequent encounter: Secondary | ICD-10-CM | POA: Diagnosis not present

## 2023-04-13 ENCOUNTER — Other Ambulatory Visit: Payer: Self-pay | Admitting: *Deleted

## 2023-04-13 DIAGNOSIS — Z79899 Other long term (current) drug therapy: Secondary | ICD-10-CM

## 2023-04-13 DIAGNOSIS — M1A00X0 Idiopathic chronic gout, unspecified site, without tophus (tophi): Secondary | ICD-10-CM | POA: Diagnosis not present

## 2023-04-14 LAB — COMPLETE METABOLIC PANEL WITH GFR
AG Ratio: 1.4 (calc) (ref 1.0–2.5)
ALT: 29 U/L (ref 9–46)
AST: 27 U/L (ref 10–35)
Albumin: 4.4 g/dL (ref 3.6–5.1)
Alkaline phosphatase (APISO): 50 U/L (ref 35–144)
BUN/Creatinine Ratio: 23 (calc) — ABNORMAL HIGH (ref 6–22)
BUN: 37 mg/dL — ABNORMAL HIGH (ref 7–25)
CO2: 32 mmol/L (ref 20–32)
Calcium: 9.7 mg/dL (ref 8.6–10.3)
Chloride: 99 mmol/L (ref 98–110)
Creat: 1.6 mg/dL — ABNORMAL HIGH (ref 0.70–1.30)
Globulin: 3.1 g/dL (ref 1.9–3.7)
Glucose, Bld: 88 mg/dL (ref 65–99)
Potassium: 4.4 mmol/L (ref 3.5–5.3)
Sodium: 141 mmol/L (ref 135–146)
Total Bilirubin: 1 mg/dL (ref 0.2–1.2)
Total Protein: 7.5 g/dL (ref 6.1–8.1)
eGFR: 51 mL/min/{1.73_m2} — ABNORMAL LOW (ref >=60)

## 2023-04-14 LAB — CBC WITH DIFFERENTIAL/PLATELET
Absolute Lymphocytes: 2367 {cells}/uL (ref 850–3900)
Absolute Monocytes: 552 {cells}/uL (ref 200–950)
Basophils Absolute: 27 {cells}/uL (ref 0–200)
Basophils Relative: 0.3 %
Eosinophils Absolute: 98 {cells}/uL (ref 15–500)
Eosinophils Relative: 1.1 %
HCT: 44.7 % (ref 38.5–50.0)
Hemoglobin: 15 g/dL (ref 13.2–17.1)
MCH: 29.9 pg (ref 27.0–33.0)
MCHC: 33.6 g/dL (ref 32.0–36.0)
MCV: 89.2 fL (ref 80.0–100.0)
MPV: 11.2 fL (ref 7.5–12.5)
Monocytes Relative: 6.2 %
Neutro Abs: 5856 {cells}/uL (ref 1500–7800)
Neutrophils Relative %: 65.8 %
Platelets: 242 10*3/uL (ref 140–400)
RBC: 5.01 10*6/uL (ref 4.20–5.80)
RDW: 13 % (ref 11.0–15.0)
Total Lymphocyte: 26.6 %
WBC: 8.9 10*3/uL (ref 3.8–10.8)

## 2023-04-14 LAB — URIC ACID: Uric Acid, Serum: 6.5 mg/dL (ref 4.0–8.0)

## 2023-04-19 ENCOUNTER — Other Ambulatory Visit: Payer: Self-pay | Admitting: Internal Medicine

## 2023-04-19 DIAGNOSIS — M1A00X0 Idiopathic chronic gout, unspecified site, without tophus (tophi): Secondary | ICD-10-CM

## 2023-04-19 NOTE — Progress Notes (Signed)
Uric acid looks good improved to 6.5 down from 7.8 last year.  Kidney function is stable with estimated GFR of 51.  Blood count remains normal.

## 2023-04-19 NOTE — Telephone Encounter (Signed)
Last Fill: 01/21/2023  Labs: 04/13/2023 BUN 37, Creat. 1.60, GFR 51, BUN/Creat. Ratio 23  Next Visit: 05/06/2023  Last Visit: 11/04/2022  DX: Chronic gouty arthritis   Current Dose per office note 11/04/2022: continue febuxostat 120 mg daily   Okay to refill Uloric?

## 2023-04-25 NOTE — Progress Notes (Signed)
 Office Visit Note  Patient: Willie Daniel             Date of Birth: January 20, 1971           MRN: [id number]             PCP: Assunta Found, MD Referring: Assunta Found, MD Visit Date: 05/06/2023   Subjective:  Follow-up   History of Present Illness: Willie Daniel is a 53 y.o. male here for follow up for chronic gouty arthritis on Uloric 120 mg daily with colchicine and prednisone as needed.  Gout has been doing well he reports having 3 or maybe 4 flareups of inflammation e.g. which resolved with colchicine and taking prednisone usually 3 days or less.  He had 1 episode of pain and warmth to the touch in his left shoulder that he thinks may have been a gout flare because the pain was very similar.  Besides that his worst problem area is in the right shoulder.  He has consistent difficulty lifting heavily but mostly with overhead or behind the back movements.  Shoulder pain is also aggravated lying on his right side at night.  Pain sometimes radiates into the upper arm.  No significant numbness associated and no radiation to the hand, no increase in neck pain.  He saw orthopedics for this with intra-articular steroid injection and referral to physical therapy but he has not seen a great relief in symptoms so far.  He last saw physical therapy about 2 weeks ago.   Previous HPI 11/04/2022 Willie Daniel is a 53 y.o. male here for follow up for chronic gouty arthritis on Uloric 120 mg daily with colchicine and prednisone as needed.  Last substantial flareup was in May involving his left ankle when he called in but even this resolved within about 5 days after starting the colchicine and took prednisone a few days.  Otherwise is not needing any anti-inflammatory medicine on a daily basis and no new large joint effusions or nodules.  He is also working on his diet by cutting out more sugars and increasing his level of exercise.  This is showing benefit with last hemoglobin A1c down to 5.5%.  He had  recent labs checked for his primary care office with no significant change in kidney function.   Previous HPI 05/06/2022 Willie Daniel is a 53 y.o. male here for follow up for chronic gouty arthritis on Uloric dose was increased to 120 mg daily after last visit in November.  He has not suffered any major flareups since then or had to take prednisone.  He has had a couple episodes of increased pain and stiffness but took colchicine for only 1 or 2 days with improvement.  Had umbilical hernia repair surgery without complications.  He is starting to get back in the gym to work on increasing his exercise with a goal of weight loss.   Previous HPI 02/01/22 Willie Daniel is a 53 y.o. male here for gout follow up on uloric 80 mg daily. Uric acid at last visit was 9.3. Interal BMP with serum creatinine 1.7 slightly worse than before. He has had a few flare ups, most noticeable in the left ankle and afterwards a new nodule on the back of his ankle. This is not particularly tender but does have some skin irritation with friction on his sock and shoe. Also slightly worse trouble tightly closing his left knuckles.   Previous HPI 07/14/21 Willie Daniel is a 53  y.o. male here for follow up for chronic, erosive, tophaceous, polyarticular gout on colchicine 0.6 mg PO daily and febuxostat 80 mg PO daily. Overall he is doing well he continues to experience intermittent flare ups of joint pain but better most of the time. Called in for repeat steroid treatment 4/11. He noticed increased stiffness and catching or locking in his right index finger usually first thing in the morning.   07/15/20 Willie Daniel is a 53 y.o. male here for evaluation of osteoarthritis, gouty arthritis, with chronic left knee pain.  He has had chronic joint pains and episodic gout flares in numerous areas going back more than 10 years with involvement in his feet knees elbows hands and possibly shoulders.  He also has some known  osteoarthritis in joints and was recently seen by orthopedic surgery clinic with steroid injection of the right knee and hyaluronic acid supplementation injection of the left knee.  He has had a previous left knee arthroscopy in 2014.  His gout has been problematic for a long time he is seeing [facility] rheumatology for this issue during the past approximately 1.5 years he was titrated to a high dose of allopurinol with as needed colchicine use and achieved a uric acid goal of less than 6.  However despite this he has had a more gout pain and inflammation in the past 6 months than ever before.  He was previously recommended against taking prophylactic colchicine daily with what sounds like some liver or renal function changes on labs and also having chronic diarrhea from the medicine.  For recent flares he received a steroid taper prescription from his PCP that was much more beneficial for the symptoms flare this was around March. He also thinks that trying the colchicine tablets was more effective than capsules for recent flares. He is concerned about lack of disease control despite good uric acid level and stopping all alcohol use and red meats and shellfish and very limited soft drink use and wonders if different treatments might be more beneficial.   Review of Systems  Constitutional:  Negative for fatigue.  HENT:  Negative for mouth sores and mouth dryness.   Eyes:  Positive for dryness.  Respiratory:  Positive for shortness of breath.   Cardiovascular:  Negative for chest pain and palpitations.  Gastrointestinal:  Negative for blood in stool, constipation and diarrhea.  Endocrine: Negative for increased urination.  Genitourinary:  Negative for involuntary urination.  Musculoskeletal:  Positive for joint pain, gait problem, joint pain, myalgias, morning stiffness and myalgias. Negative for joint swelling, muscle weakness and muscle tenderness.  Skin:  Negative for color change, rash, hair loss and  sensitivity to sunlight.  Allergic/Immunologic: Negative for susceptible to infections.  Neurological:  Positive for dizziness. Negative for headaches.  Hematological:  Negative for swollen glands.  Psychiatric/Behavioral:  Negative for depressed mood and sleep disturbance. The patient is nervous/anxious.     PMFS History:  Patient Active Problem List   Diagnosis Date Noted   Impingement syndrome of right shoulder 05/06/2023   Neuropathy 08/27/2020   Bilateral primary osteoarthritis of knee 07/15/2020   Benign essential hypertension 06/19/2020   Chronic gouty arthritis 06/19/2020   Chronic kidney disease 06/19/2020   Hypothyroidism 06/19/2020   Idiopathic peripheral neuropathy 03/04/2020   Chronic systolic CHF (congestive heart failure) (HCC) 01/29/2019   History of cardioversion 07/21/2017   Atrial flutter with rapid ventricular response (HCC) 07/21/2017   AVNRT (AV nodal re-entry tachycardia) (HCC) 05/26/2017   Nocturnal hypoxemia 07/15/2016  OSA on CPAP 07/17/2015   Atrial flutter (HCC) 04/10/2015   L-loop transposition of great arteries 10/12/2011    Past Medical History:  Diagnosis Date   Chronic gouty arthritis    Hypertension    Neuropathy    OSA on CPAP 07/17/2015   Osteoarthritis     Family History  Problem Relation Age of Onset   Multiple myeloma Mother    Healthy Brother    Healthy Brother    Healthy Daughter    Healthy Daughter    Healthy Son    Sleep apnea Neg Hx    Past Surgical History:  Procedure Laterality Date   CARDIAC SURGERY  1976   age 65 and age 68 at [facility]   ELECTROPHYSIOLOGIC STUDY N/A 04/14/2015   Procedure: Cardioversion;  Surgeon: [facility] Salvia, MD;  Location: [facility] INVASIVE CV LAB;  Service: Cardiovascular;  Laterality: N/A;   HERNIA REPAIR  03/12/2022   I & D EXTREMITY  2004   right leg infection   KNEE ARTHROSCOPY WITH MEDIAL MENISECTOMY Left 12/14/2012   Procedure: KNEE ARTHROSCOPY WITH PARTIAL MEDIAL AND LATERAL MENISECTOMY EXTENSIVE  SYNOVECTOMY REMOVAL NODULE LEFT KNEE;  Surgeon: Loreta Ave, MD;  Location: [facility];  Service: Orthopedics;  Laterality: Left;   TEE WITHOUT CARDIOVERSION N/A 04/14/2015   Procedure: TRANSESOPHAGEAL ECHOCARDIOGRAM (TEE);  Surgeon: Wendall Stade, MD;  Location: [facility] ENDOSCOPY;  Service: Cardiovascular;  Laterality: N/A;   VASECTOMY  2010   general anesth   Social History   Social History Narrative   Lives at home with wife and kids   Caffeine use: Tea/soda occass (2-3 per week)   Immunization History  Administered Date(s) Administered   PFIZER(Purple Top)SARS-COV-2 Vaccination 06/04/2019     Objective: Vital Signs: BP 102/70 (BP Location: Left Arm, Patient Position: Sitting, Cuff Size: Large)   Pulse 60   Resp 14   Ht 6\' 1"  (1.854 m)   Wt (!) 301 lb (136.5 kg)   BMI 39.71 kg/m    Physical Exam Constitutional:      Appearance: He is obese.  Cardiovascular:     Rate and Rhythm: Normal rate and regular rhythm.  Pulmonary:     Effort: Pulmonary effort is normal.     Breath sounds: Normal breath sounds.  Skin:    General: Skin is warm and dry.     Comments: Trace bilateral pedal edema  Neurological:     Mental Status: He is alert.  Psychiatric:        Mood and Affect: Mood normal.      Musculoskeletal Exam:  Right shoulder pain is provoked with overhead abduction and external rotation, has tenderness to pressure on anterior lateral side of joint with no palpable swelling Elbows full ROM no tenderness or swelling Wrists full ROM no tenderness or swelling Fingers full ROM slightly decreased flexion range of motion at left 2nd-3rd MCP joints Knees full ROM no tenderness or swelling, crepitus present Restricted flexion extension of first MTP joint on both feet, no swelling no tenderness to pressure  Investigation: No additional findings.  Imaging: No results found.  Recent Labs: Lab Results  Component Value Date   WBC 8.9 04/13/2023   HGB 15.0  04/13/2023   PLT 242 04/13/2023   NA 141 04/13/2023   K 4.4 04/13/2023   CL 99 04/13/2023   CO2 32 04/13/2023   GLUCOSE 88 04/13/2023   BUN 37 (H) 04/13/2023   CREATININE 1.60 (H) 04/13/2023   BILITOT 1.0 04/13/2023  AST 27 04/13/2023   ALT 29 04/13/2023   PROT 7.5 04/13/2023   CALCIUM 9.7 04/13/2023   GFRAA 76 08/21/2020    Speciality Comments: No specialty comments available.  Procedures:  No procedures performed Allergies: Patient has no known allergies.   Assessment / Plan:     Visit Diagnoses: Chronic gouty arthritis - 04/13/2023 Uric Acid 6.5 Medication monitoring encounter - febuxostat 120 mg daily.  Can continue the colchicine 0.6 mg Long term (current) use of systemic steroids - prednisone 10 mg for use just as needed. Gouty arthritis appears moderately well-controlled not totally in remission.  Last uric acid level of 6.5 is improved compared to last year he is already titrated to a maximal dose of Uloric with 120 mg daily.  About 3-4 episodes that resolved within 3 days of as needed steroids is probably optimal control versus trying to add second medication considering his chronic kidney disease and numerous other medications. -Continue Uloric 120 mg daily -Continue colchicine 0.6 mg and prednisone 10 mg as needed for flares  Bilateral primary osteoarthritis of knee - Lyrica at 150 mg 3 times daily and taking Tylenol as needed. On long-term Lyrica for pain management with bilateral knee osteoarthritis and more so neuropathy.  Please Tylenol as needed which does help partially with overall pain.  Not significantly helpful for the reported shoulder pain.  Impingement syndrome of right shoulder Right shoulder pain appears most consistent with impingement syndrome.  Pain is provoked with associated maneuvers and activities.  He reports having a recent x-ray in orthopedics clinic which I do not have available for review but I do not think there is very advanced  osteoarthritis given the good passive range of motion.  I agree with the previous treatment plan including intra-articular steroid injection and physical therapy. -Provided printed range of motion and strengthening exercises for shoulder impingement -Recommend he contact again his existing physical therapist about these or other exercise modifications -If failing to improve with this treatment probably needs orthopedics follow-up consider shoulder MRI as it would be suspicious for a more chronic rotator cuff pathology   Orders: No orders of the defined types were placed in this encounter.  No orders of the defined types were placed in this encounter.    Follow-Up Instructions: No follow-ups on file.   Fuller Plan, MD  Note - This record has been created using [employer].  Chart creation errors have been sought, but may not always  have been located. Such creation errors do not reflect on  the standard of medical care.

## 2023-05-06 ENCOUNTER — Encounter: Payer: Self-pay | Admitting: Internal Medicine

## 2023-05-06 ENCOUNTER — Ambulatory Visit: Payer: BC Managed Care – PPO | Attending: Internal Medicine | Admitting: Internal Medicine

## 2023-05-06 VITALS — BP 102/70 | HR 60 | Resp 14 | Ht 73.0 in | Wt 301.0 lb

## 2023-05-06 DIAGNOSIS — Z5181 Encounter for therapeutic drug level monitoring: Secondary | ICD-10-CM | POA: Diagnosis not present

## 2023-05-06 DIAGNOSIS — M170 Bilateral primary osteoarthritis of knee: Secondary | ICD-10-CM | POA: Diagnosis not present

## 2023-05-06 DIAGNOSIS — Z7952 Long term (current) use of systemic steroids: Secondary | ICD-10-CM | POA: Diagnosis not present

## 2023-05-06 DIAGNOSIS — M1A00X0 Idiopathic chronic gout, unspecified site, without tophus (tophi): Secondary | ICD-10-CM | POA: Diagnosis not present

## 2023-05-06 DIAGNOSIS — N189 Chronic kidney disease, unspecified: Secondary | ICD-10-CM

## 2023-05-06 DIAGNOSIS — M7541 Impingement syndrome of right shoulder: Secondary | ICD-10-CM

## 2023-05-06 MED ORDER — FEBUXOSTAT 80 MG PO TABS: 1.5000 | ORAL_TABLET | Freq: Every day | ORAL | 1 refills | Status: DC

## 2023-05-12 DIAGNOSIS — M7541 Impingement syndrome of right shoulder: Secondary | ICD-10-CM | POA: Diagnosis not present

## 2023-05-20 ENCOUNTER — Other Ambulatory Visit: Payer: Self-pay | Admitting: Adult Health

## 2023-05-20 DIAGNOSIS — M25511 Pain in right shoulder: Secondary | ICD-10-CM | POA: Diagnosis not present

## 2023-05-23 NOTE — Telephone Encounter (Signed)
 Called pt and verify that he did Refill his Rx on 05/20/23. Pt states he didn't request the refill the pharmacy automatic sent the request. I inform him to request his Rx two weeks before he runs out. Pt verbalized understanding. Pt had no questions at this time but was encouraged to call back if questions arise.

## 2023-06-03 DIAGNOSIS — Q203 Discordant ventriculoarterial connection: Secondary | ICD-10-CM | POA: Diagnosis not present

## 2023-06-08 ENCOUNTER — Telehealth: Payer: Self-pay | Admitting: Adult Health

## 2023-06-08 NOTE — Telephone Encounter (Signed)
 Pt requested to schedule follow up with Dr. Vickey Huger.

## 2023-06-21 DIAGNOSIS — M545 Low back pain, unspecified: Secondary | ICD-10-CM | POA: Diagnosis not present

## 2023-06-27 DIAGNOSIS — M545 Low back pain, unspecified: Secondary | ICD-10-CM | POA: Diagnosis not present

## 2023-06-30 DIAGNOSIS — Q221 Congenital pulmonary valve stenosis: Secondary | ICD-10-CM | POA: Diagnosis not present

## 2023-06-30 DIAGNOSIS — Z1211 Encounter for screening for malignant neoplasm of colon: Secondary | ICD-10-CM | POA: Diagnosis not present

## 2023-06-30 DIAGNOSIS — Z01818 Encounter for other preprocedural examination: Secondary | ICD-10-CM | POA: Diagnosis not present

## 2023-06-30 DIAGNOSIS — Q210 Ventricular septal defect: Secondary | ICD-10-CM | POA: Diagnosis not present

## 2023-07-04 DIAGNOSIS — M545 Low back pain, unspecified: Secondary | ICD-10-CM | POA: Diagnosis not present

## 2023-07-06 ENCOUNTER — Telehealth: Payer: Self-pay

## 2023-07-06 NOTE — Telephone Encounter (Signed)
 Submitted a Prior Authorization request to [employer] for Uloric  via CoverMyMeds. Will update once we receive a response.

## 2023-07-08 DIAGNOSIS — G4733 Obstructive sleep apnea (adult) (pediatric): Secondary | ICD-10-CM | POA: Diagnosis not present

## 2023-07-11 DIAGNOSIS — G4730 Sleep apnea, unspecified: Secondary | ICD-10-CM | POA: Diagnosis not present

## 2023-07-11 DIAGNOSIS — Z7982 Long term (current) use of aspirin: Secondary | ICD-10-CM | POA: Diagnosis not present

## 2023-07-11 DIAGNOSIS — Z1211 Encounter for screening for malignant neoplasm of colon: Secondary | ICD-10-CM | POA: Diagnosis not present

## 2023-07-11 DIAGNOSIS — D122 Benign neoplasm of ascending colon: Secondary | ICD-10-CM | POA: Diagnosis not present

## 2023-07-11 DIAGNOSIS — Z7901 Long term (current) use of anticoagulants: Secondary | ICD-10-CM | POA: Diagnosis not present

## 2023-07-11 DIAGNOSIS — I110 Hypertensive heart disease with heart failure: Secondary | ICD-10-CM | POA: Diagnosis not present

## 2023-07-11 DIAGNOSIS — I5022 Chronic systolic (congestive) heart failure: Secondary | ICD-10-CM | POA: Diagnosis not present

## 2023-07-11 DIAGNOSIS — K5730 Diverticulosis of large intestine without perforation or abscess without bleeding: Secondary | ICD-10-CM | POA: Diagnosis not present

## 2023-07-11 DIAGNOSIS — I4891 Unspecified atrial fibrillation: Secondary | ICD-10-CM | POA: Diagnosis not present

## 2023-07-11 DIAGNOSIS — D123 Benign neoplasm of transverse colon: Secondary | ICD-10-CM | POA: Diagnosis not present

## 2023-07-12 NOTE — Telephone Encounter (Signed)
 Received notification from [facility] regarding a prior authorization for Febuxostat  80 mg tablet. Authorization has been APPROVED from 07/12/2023 to 07/11/2024.

## 2023-07-13 DIAGNOSIS — M545 Low back pain, unspecified: Secondary | ICD-10-CM | POA: Diagnosis not present

## 2023-07-15 DIAGNOSIS — M545 Low back pain, unspecified: Secondary | ICD-10-CM | POA: Diagnosis not present

## 2023-07-18 DIAGNOSIS — M75121 Complete rotator cuff tear or rupture of right shoulder, not specified as traumatic: Secondary | ICD-10-CM | POA: Diagnosis not present

## 2023-07-18 DIAGNOSIS — M25511 Pain in right shoulder: Secondary | ICD-10-CM | POA: Diagnosis not present

## 2023-07-18 DIAGNOSIS — I5022 Chronic systolic (congestive) heart failure: Secondary | ICD-10-CM | POA: Diagnosis not present

## 2023-07-18 DIAGNOSIS — M67921 Unspecified disorder of synovium and tendon, right upper arm: Secondary | ICD-10-CM | POA: Diagnosis not present

## 2023-07-18 DIAGNOSIS — M9903 Segmental and somatic dysfunction of lumbar region: Secondary | ICD-10-CM | POA: Diagnosis not present

## 2023-07-18 DIAGNOSIS — M19011 Primary osteoarthritis, right shoulder: Secondary | ICD-10-CM | POA: Diagnosis not present

## 2023-07-18 DIAGNOSIS — R918 Other nonspecific abnormal finding of lung field: Secondary | ICD-10-CM | POA: Diagnosis not present

## 2023-07-25 ENCOUNTER — Emergency Department (HOSPITAL_BASED_OUTPATIENT_CLINIC_OR_DEPARTMENT_OTHER): Admitting: Radiology

## 2023-07-25 ENCOUNTER — Encounter (HOSPITAL_BASED_OUTPATIENT_CLINIC_OR_DEPARTMENT_OTHER): Payer: Self-pay

## 2023-07-25 ENCOUNTER — Other Ambulatory Visit: Payer: Self-pay

## 2023-07-25 DIAGNOSIS — Z23 Encounter for immunization: Secondary | ICD-10-CM | POA: Diagnosis not present

## 2023-07-25 DIAGNOSIS — S60411A Abrasion of left index finger, initial encounter: Secondary | ICD-10-CM | POA: Diagnosis not present

## 2023-07-25 DIAGNOSIS — S6992XA Unspecified injury of left wrist, hand and finger(s), initial encounter: Secondary | ICD-10-CM | POA: Diagnosis not present

## 2023-07-25 DIAGNOSIS — Z7901 Long term (current) use of anticoagulants: Secondary | ICD-10-CM | POA: Insufficient documentation

## 2023-07-25 DIAGNOSIS — Z79899 Other long term (current) drug therapy: Secondary | ICD-10-CM | POA: Diagnosis not present

## 2023-07-25 DIAGNOSIS — S61452A Open bite of left hand, initial encounter: Secondary | ICD-10-CM | POA: Insufficient documentation

## 2023-07-25 DIAGNOSIS — W540XXA Bitten by dog, initial encounter: Secondary | ICD-10-CM | POA: Diagnosis not present

## 2023-07-25 DIAGNOSIS — Z7984 Long term (current) use of oral hypoglycemic drugs: Secondary | ICD-10-CM | POA: Diagnosis not present

## 2023-07-25 MED ORDER — OXYCODONE-ACETAMINOPHEN 5-325 MG PO TABS: 1.0000 | ORAL_TABLET | ORAL | Status: DC | PRN

## 2023-07-25 NOTE — ED Notes (Signed)
 Wound was cleaned and rinsed in triage with wound cleaner.

## 2023-07-25 NOTE — ED Triage Notes (Signed)
 Patient was bit by another dog. States his dog and another dog started scuffling. States he does not know anything about the dog. The property owner called Animal control for [facility].  Presents with wounds to the left hand on the thumb, index finger and wrist. Patient cleaned wounds with peroxide before leaving home.

## 2023-07-26 ENCOUNTER — Emergency Department (HOSPITAL_BASED_OUTPATIENT_CLINIC_OR_DEPARTMENT_OTHER)
Admission: EM | Admit: 2023-07-26 | Discharge: 2023-07-26 | Disposition: A | Discharge status: Home / Self Care | Attending: Emergency Medicine | Admitting: Emergency Medicine

## 2023-07-26 DIAGNOSIS — S61452A Open bite of left hand, initial encounter: Secondary | ICD-10-CM

## 2023-07-26 MED ORDER — TETANUS-DIPHTH-ACELL PERTUSSIS 5-2.5-18.5 LF-MCG/0.5 IM SUSY
0.5000 mL | PREFILLED_SYRINGE | Freq: Once | INTRAMUSCULAR | Status: AC
Start: 2023-07-26 — End: 2023-07-26
  Administered 2023-07-26: 0.5 mL via INTRAMUSCULAR
  Filled 2023-07-26: qty 0.5

## 2023-07-26 MED ORDER — AMOXICILLIN-POT CLAVULANATE 500-125 MG PO TABS: 1.0000 | ORAL_TABLET | Freq: Three times a day (TID) | ORAL | 0 refills | Status: AC

## 2023-07-26 MED ORDER — AMOXICILLIN-POT CLAVULANATE 875-125 MG PO TABS
1.0000 | ORAL_TABLET | Freq: Once | ORAL | Status: AC
  Administered 2023-07-26: 1 via ORAL
  Filled 2023-07-26: qty 1

## 2023-07-26 NOTE — ED Notes (Signed)
 Wounds to L hand cleaned, bacitracin applied

## 2023-07-26 NOTE — ED Notes (Signed)
 RN reviewed discharge instructions with pt. Pt verbalized understanding and had no further questions. VSS upon discharge.

## 2023-07-26 NOTE — Discharge Instructions (Signed)
 Begin taking Augmentin as prescribed.  Local wound care with bacitracin and dressing changes twice daily.  Quarantine the animal for the next 10 days.  If in 10 days the animal is well, no further action is required.  If the animal becomes sick and dies or escapes, return to the ER to begin the rabies series.

## 2023-07-26 NOTE — ED Provider Notes (Signed)
 [place] EMERGENCY DEPARTMENT AT [facility] Provider Note   CSN: [id number] Arrival date & time: 07/25/23  2200     History  Chief Complaint  Patient presents with   Animal Bite    Willie Daniel is a 53 y.o. male.  Patient is a 53 year old male presenting with a dog bite to his left hand.  He was walking his dog this evening when another dog in the neighborhood got into a fight with his dog.  He attempted to break it up, then this other dog bit him on the hand.  He has puncture wounds noted to the left thumb and index fingers.  Bleeding controlled with direct pressure.  Patient unknown of last tetanus shot.  Dog shots are unknown, but he does know who the owner is.       Home Medications Prior to Admission medications   Medication Sig Start Date End Date Taking? Authorizing Provider  Acetaminophen  Extra Strength 500 MG TABS Take 2 tablets by mouth every 6 (six) hours. 03/12/22   [provider]  ALPRAZolam (XANAX XR) 1 MG 24 hr tablet Take 1 mg by mouth daily. Patient not taking: Reported on 05/06/2023 10/27/22   [provider]  apixaban  (ELIQUIS ) 5 MG TABS tablet Take by mouth 2 (two) times daily.  05/29/17   [provider]  aspirin EC 81 MG tablet Take by mouth.    [provider]  b complex vitamins capsule Take 1 capsule by mouth daily.    [provider]  colchicine  0.6 MG tablet Take 1 tablet (0.6 mg total) by mouth as needed. 02/15/23   Matt Song, MD  dofetilide (TIKOSYN) 250 MCG capsule Take 250 mcg by mouth 2 (two) times daily.    [provider]  ENTRESTO 24-26 MG Take 1 tablet by mouth 2 (two) times daily.    [provider]  Febuxostat  80 MG TABS Take 1.5 tablets (120 mg total) by mouth daily. 05/06/23   Matt Song, MD  fenofibrate 160 MG tablet Take by mouth. 09/21/21   [provider]  JARDIANCE 10 MG TABS tablet Take 10 mg by mouth daily.    [provider]   levothyroxine (SYNTHROID) 50 MCG tablet Take 50 mcg by mouth daily. 05/21/20   [provider]  metoprolol  succinate (TOPROL -XL) 25 MG 24 hr tablet Take 25 mg by mouth daily. Patient not taking: Reported on 05/06/2023 05/15/21   [provider]  olmesartan (BENICAR) 20 MG tablet Take 20 mg by mouth daily. Patient not taking: Reported on 05/06/2023 10/23/20   [provider]  potassium chloride SA (K-DUR) 20 MEQ tablet Take by mouth as needed. 08/31/17 07/14/21  [provider]  predniSONE  (DELTASONE ) 10 MG tablet Take 1 tablet (10 mg total) by mouth daily as needed. 02/15/23   Matt Song, MD  pregabalin  (LYRICA ) 150 MG capsule Take 1 tablet in the morning, mid afternoon and before bedtime 01/24/23   Millikan, Megan, NP  torsemide (DEMADEX) 20 MG tablet Take 20 mg by mouth daily. 03/08/17   [provider]  traMADol (ULTRAM) 50 MG tablet     [provider]  VITAMIN D PO Take by mouth.    [provider]  VITAMIN E PO Take by mouth.    [provider]      Allergies    Patient has no known allergies.    Review of Systems   Review of Systems  All other systems  reviewed and are negative.   Physical Exam Updated Vital Signs BP 112/66 (BP Location: Left Arm)   Pulse 73   Temp (!) 97.2 F (36.2 C)   Resp 20   Ht 6\' 1"  (1.854 m)   Wt 135.2 kg   SpO2 96%   BMI 39.32 kg/m  Physical Exam Vitals and nursing note reviewed.  Constitutional:      Appearance: Normal appearance.  Pulmonary:     Effort: Pulmonary effort is normal.  Musculoskeletal:     Comments: The left hand has a puncture wound noted to the thenar eminence and more superficial abrasions noted to the left index finger.  He has good range of motion of all fingers with no evidence for tendon injury.  Skin:    General: Skin is warm and dry.  Neurological:     Mental Status: He is alert and oriented to person, place, and time.     ED Results /  Procedures / Treatments   Labs (all labs ordered are listed, but only abnormal results are displayed) Labs Reviewed - No data to display  EKG None  Radiology DG Hand Complete Left Result Date: 07/25/2023 CLINICAL DATA:  Animal bite. EXAM: LEFT HAND - COMPLETE 3+ VIEW COMPARISON:  Left hand x-ray 07/15/2020 the FINDINGS: There is no evidence of fracture or dislocation. There is no evidence of arthropathy or other focal bone abnormality. Soft tissues are unremarkable. IMPRESSION: Negative. Electronically Signed   By: Tyron Gallon M.D.   On: 07/25/2023 22:50    Procedures Procedures    Medications Ordered in ED Medications  oxyCODONE -acetaminophen  (PERCOCET/ROXICET) 5-325 MG per tablet 1 tablet (has no administration in time range)  Tdap (BOOSTRIX) injection 0.5 mL (has no administration in time range)  amoxicillin-clavulanate (AUGMENTIN) 875-125 MG per tablet 1 tablet (has no administration in time range)    ED Course/ Medical Decision Making/ A&P  Patient with dog bite to the left hand.  Wounds will be cleaned and patient treated with Augmentin.  Tetanus to be updated.  The dog's immunization status unknown.  Will advise patient to advise his neighbor of a 10-day quarantine.  To return as needed to begin rabies series if the animal escapes, gets sick, or dies.  Final Clinical Impression(s) / ED Diagnoses Final diagnoses:  None    Rx / DC Orders ED Discharge Orders     None         Orvilla Blander, MD 07/26/23 [phone]

## 2023-07-28 DIAGNOSIS — Z8774 Personal history of (corrected) congenital malformations of heart and circulatory system: Secondary | ICD-10-CM | POA: Diagnosis not present

## 2023-07-28 DIAGNOSIS — R600 Localized edema: Secondary | ICD-10-CM | POA: Diagnosis not present

## 2023-07-28 DIAGNOSIS — Z79899 Other long term (current) drug therapy: Secondary | ICD-10-CM | POA: Diagnosis not present

## 2023-07-28 DIAGNOSIS — Q203 Discordant ventriculoarterial connection: Secondary | ICD-10-CM | POA: Diagnosis not present

## 2023-07-28 DIAGNOSIS — M9903 Segmental and somatic dysfunction of lumbar region: Secondary | ICD-10-CM | POA: Diagnosis not present

## 2023-07-28 DIAGNOSIS — I4892 Unspecified atrial flutter: Secondary | ICD-10-CM | POA: Diagnosis not present

## 2023-07-28 DIAGNOSIS — Z48812 Encounter for surgical aftercare following surgery on the circulatory system: Secondary | ICD-10-CM | POA: Diagnosis not present

## 2023-07-28 DIAGNOSIS — Z7901 Long term (current) use of anticoagulants: Secondary | ICD-10-CM | POA: Diagnosis not present

## 2023-07-28 DIAGNOSIS — I5022 Chronic systolic (congestive) heart failure: Secondary | ICD-10-CM | POA: Diagnosis not present

## 2023-07-28 DIAGNOSIS — I471 Supraventricular tachycardia, unspecified: Secondary | ICD-10-CM | POA: Diagnosis not present

## 2023-08-05 DIAGNOSIS — M9903 Segmental and somatic dysfunction of lumbar region: Secondary | ICD-10-CM | POA: Diagnosis not present

## 2023-08-05 DIAGNOSIS — M545 Low back pain, unspecified: Secondary | ICD-10-CM | POA: Diagnosis not present

## 2023-08-08 DIAGNOSIS — G4733 Obstructive sleep apnea (adult) (pediatric): Secondary | ICD-10-CM | POA: Diagnosis not present

## 2023-08-09 DIAGNOSIS — M9903 Segmental and somatic dysfunction of lumbar region: Secondary | ICD-10-CM | POA: Diagnosis not present

## 2023-08-10 DIAGNOSIS — W540XXA Bitten by dog, initial encounter: Secondary | ICD-10-CM | POA: Diagnosis not present

## 2023-08-10 DIAGNOSIS — E6609 Other obesity due to excess calories: Secondary | ICD-10-CM | POA: Diagnosis not present

## 2023-08-10 DIAGNOSIS — Z6839 Body mass index (BMI) 39.0-39.9, adult: Secondary | ICD-10-CM | POA: Diagnosis not present

## 2023-08-10 DIAGNOSIS — M79642 Pain in left hand: Secondary | ICD-10-CM | POA: Diagnosis not present

## 2023-08-16 ENCOUNTER — Ambulatory Visit: Payer: BC Managed Care – PPO | Admitting: Adult Health

## 2023-08-23 DIAGNOSIS — M9903 Segmental and somatic dysfunction of lumbar region: Secondary | ICD-10-CM | POA: Diagnosis not present

## 2023-08-31 DIAGNOSIS — I872 Venous insufficiency (chronic) (peripheral): Secondary | ICD-10-CM | POA: Diagnosis not present

## 2023-08-31 DIAGNOSIS — L570 Actinic keratosis: Secondary | ICD-10-CM | POA: Diagnosis not present

## 2023-09-07 DIAGNOSIS — G4733 Obstructive sleep apnea (adult) (pediatric): Secondary | ICD-10-CM | POA: Diagnosis not present

## 2023-09-08 ENCOUNTER — Telehealth: Payer: Self-pay | Admitting: Neurology

## 2023-09-08 DIAGNOSIS — Z79899 Other long term (current) drug therapy: Secondary | ICD-10-CM | POA: Diagnosis not present

## 2023-09-08 DIAGNOSIS — I4891 Unspecified atrial fibrillation: Secondary | ICD-10-CM | POA: Diagnosis not present

## 2023-09-08 DIAGNOSIS — Q203 Discordant ventriculoarterial connection: Secondary | ICD-10-CM | POA: Diagnosis not present

## 2023-09-08 DIAGNOSIS — Z01818 Encounter for other preprocedural examination: Secondary | ICD-10-CM | POA: Diagnosis not present

## 2023-09-08 DIAGNOSIS — Z7901 Long term (current) use of anticoagulants: Secondary | ICD-10-CM | POA: Diagnosis not present

## 2023-09-08 DIAGNOSIS — R54 Age-related physical debility: Secondary | ICD-10-CM | POA: Diagnosis not present

## 2023-09-08 DIAGNOSIS — I5022 Chronic systolic (congestive) heart failure: Secondary | ICD-10-CM | POA: Diagnosis not present

## 2023-09-08 DIAGNOSIS — I272 Pulmonary hypertension, unspecified: Secondary | ICD-10-CM | POA: Diagnosis not present

## 2023-09-08 DIAGNOSIS — N1831 Chronic kidney disease, stage 3a: Secondary | ICD-10-CM | POA: Diagnosis not present

## 2023-09-08 DIAGNOSIS — Z8774 Personal history of (corrected) congenital malformations of heart and circulatory system: Secondary | ICD-10-CM | POA: Diagnosis not present

## 2023-09-08 DIAGNOSIS — G4733 Obstructive sleep apnea (adult) (pediatric): Secondary | ICD-10-CM | POA: Diagnosis not present

## 2023-09-08 DIAGNOSIS — Q240 Dextrocardia: Secondary | ICD-10-CM | POA: Diagnosis not present

## 2023-09-08 NOTE — Telephone Encounter (Signed)
 Called the patient to remind him to bring machine and power cord to the upcoming apt. LVM advising of this and will send a message through mychart

## 2023-09-12 ENCOUNTER — Ambulatory Visit: Admitting: Neurology

## 2023-09-12 ENCOUNTER — Encounter: Payer: Self-pay | Admitting: Neurology

## 2023-09-12 VITALS — BP 117/62 | HR 62 | Ht 73.0 in | Wt 309.0 lb

## 2023-09-12 DIAGNOSIS — R600 Localized edema: Secondary | ICD-10-CM | POA: Diagnosis not present

## 2023-09-12 DIAGNOSIS — Q203 Discordant ventriculoarterial connection: Secondary | ICD-10-CM | POA: Diagnosis not present

## 2023-09-12 DIAGNOSIS — I4719 Other supraventricular tachycardia: Secondary | ICD-10-CM

## 2023-09-12 DIAGNOSIS — G629 Polyneuropathy, unspecified: Secondary | ICD-10-CM | POA: Diagnosis not present

## 2023-09-12 DIAGNOSIS — G4733 Obstructive sleep apnea (adult) (pediatric): Secondary | ICD-10-CM

## 2023-09-12 MED ORDER — ONDANSETRON HCL 4 MG PO TABS: 4.0000 mg | ORAL_TABLET | Freq: Three times a day (TID) | ORAL | 0 refills | Status: AC | PRN

## 2023-09-12 MED ORDER — PREGABALIN 150 MG PO CAPS
ORAL_CAPSULE | ORAL | 1 refills | Status: AC
Start: 2023-09-12 — End: ?

## 2023-09-12 NOTE — Progress Notes (Signed)
 Provider:  Dedra Gores, MD  Primary Care Physician:  Marvine Rush, MD [address] [place] [address]     Referring Provider: Marvine Rush, Md [address] [place],  [place] [address]          Chief Complaint according to patient   Patient presents with:                HISTORY OF PRESENT ILLNESS:  Willie Daniel is a 53 y.o. male patient who is here for revisit 09/12/2023 for ascending neuropathy, now reaching his fingertips . He has been gaining weight. He reports painful sensation in both legs, worsening when not on his feet, especially at night.  He has significant leg edema.  He has a complicated  cardiac history with dextrocardia.   He is compliant with his CPAP use. His air leak is very high, he never has tried another mask than the one he is using now.   CHF , systolic, chronic.     Epworth 10/ 24 points, FSS at  44. GDS  4/ 15. Feeling nauseated.  We gave some water.   See below.   Chief concern according to patient :  my  neuropathy       Fam Hx : see below   Social HX; See below       Review of Systems: Out of a complete 14 system review, the patient complains of only the following symptoms, and all other reviewed systems are negative.:  Neuropathic pain, ascending. Back pain.   Chronic systolic CHF.   Epworth 10/ 24 points, FSS at  44. GDS  4/ 15. Feeling nauseated.  I brought him some water , but it seemed not to help much. BP was lowish. .    How likely are you to doze in the following situations: 0 = not likely, 1 = slight chance, 2 = moderate chance, 3 = high chance  Sitting and Reading? Watching Television? Sitting inactive in a public place (theater or meeting)? Lying down in the afternoon when circumstances permit? Sitting and talking to someone? Sitting quietly after lunch without alcohol? In a car, while stopped for a few minutes in traffic? As a passenger in a car for an hour without a  break?  Total =  see above        Social History   Socioeconomic History   Marital status: Married    Spouse name: Rian Cramp   Number of children: 2   Years of education: 14   Highest education level: Not on file  Occupational History   Occupation: [employer]   Tobacco Use   Smoking status: Never    Passive exposure: Past   Smokeless tobacco: Never  Vaping Use   Vaping status: Never Used  Substance and Sexual Activity   Alcohol use: Not Currently   Drug use: No   Sexual activity: Not on file  Other Topics Concern   Not on file  Social History Narrative   Lives at home with wife and kids   Caffeine use: Tea/soda occass (2-3 per week)   Social Drivers of Health   Financial Resource Strain: Patient Declined (07/14/2023)   Received from [facility]   Overall Financial Resource Strain (CARDIA)    Difficulty of Paying Living Expenses: Patient declined  Food Insecurity: Patient Declined (07/14/2023)   Received from [facility]   Hunger Vital Sign    Within the  past 12 months, you worried that your food would run out before you got the money to buy more.: Patient declined    Within the past 12 months, the food you bought just didn't last and you didn't have money to get more.: Patient declined  Transportation Needs: Patient Declined (07/14/2023)   Received from [facility] - Transportation    In the past 12 months, has lack of transportation kept you from medical appointments or from getting medications?: Patient declined    Lack of Transportation (Non-Medical): Patient declined  Physical Activity: Not on file  Stress: Not on file  Social Connections: Not on file    Family History  Problem Relation Age of Onset   Multiple myeloma Mother    Healthy Brother    Healthy Brother    Healthy Daughter    Healthy Daughter    Healthy Son    Sleep apnea Neg Hx     Past Medical History:  Diagnosis  Date   Chronic gouty arthritis    Hypertension    Neuropathy    OSA on CPAP 07/17/2015   Osteoarthritis     Past Surgical History:  Procedure Laterality Date   CARDIAC SURGERY  1976   age 46 and age 39 at [facility]   ELECTROPHYSIOLOGIC STUDY N/A 04/14/2015   Procedure: Cardioversion;  Surgeon: Elspeth JAYSON Sage, MD;  Location: [facility] INVASIVE CV LAB;  Service: Cardiovascular;  Laterality: N/A;   HERNIA REPAIR  03/12/2022   I & D EXTREMITY  2004   right leg infection   KNEE ARTHROSCOPY WITH MEDIAL MENISECTOMY Left 12/14/2012   Procedure: KNEE ARTHROSCOPY WITH PARTIAL MEDIAL AND LATERAL MENISECTOMY EXTENSIVE SYNOVECTOMY REMOVAL NODULE LEFT KNEE;  Surgeon: Toribio JULIANNA Chancy, MD;  Location: [facility];  Service: Orthopedics;  Laterality: Left;   TEE WITHOUT CARDIOVERSION N/A 04/14/2015   Procedure: TRANSESOPHAGEAL ECHOCARDIOGRAM (TEE);  Surgeon: Maude JAYSON Emmer, MD;  Location: [facility] ENDOSCOPY;  Service: Cardiovascular;  Laterality: N/A;   VASECTOMY  2010   general anesth     Current Outpatient Medications on File Prior to Visit  Medication Sig Dispense Refill   Acetaminophen  Extra Strength 500 MG TABS Take 2 tablets by mouth every 6 (six) hours.     amoxicillin -clavulanate (AUGMENTIN ) 500-125 MG tablet Take 1 tablet by mouth every 8 (eight) hours. 21 tablet 0   apixaban  (ELIQUIS ) 5 MG TABS tablet Take by mouth 2 (two) times daily.      b complex vitamins capsule Take 1 capsule by mouth daily.     celecoxib (CELEBREX) 200 MG capsule Take 200 mg by mouth.     colchicine  0.6 MG tablet Take 1 tablet (0.6 mg total) by mouth as needed. 30 tablet 1   dofetilide (TIKOSYN) 250 MCG capsule Take 250 mcg by mouth 2 (two) times daily.     ENTRESTO 24-26 MG Take 1 tablet by mouth 2 (two) times daily.     Febuxostat  80 MG TABS Take 1.5 tablets (120 mg total) by mouth daily. 135 tablet 1   fenofibrate 160 MG tablet Take by mouth.     JARDIANCE 10 MG TABS tablet Take 10 mg by mouth daily.      levothyroxine (SYNTHROID) 50 MCG tablet Take 50 mcg by mouth daily.     oxyCODONE  (OXY IR/ROXICODONE ) 5 MG immediate release tablet Take 5 mg by mouth 4 (four) times daily as needed.     potassium chloride SA (K-DUR) 20 MEQ tablet Take by mouth  as needed.     predniSONE  (DELTASONE ) 10 MG tablet Take 1 tablet (10 mg total) by mouth daily as needed. 30 tablet 0   pregabalin  (LYRICA ) 150 MG capsule Take 1 tablet in the morning, mid afternoon and before bedtime 270 capsule 0   torsemide (DEMADEX) 20 MG tablet Take 20 mg by mouth daily.     VITAMIN D PO Take by mouth.     VITAMIN E PO Take by mouth.     No current facility-administered medications on file prior to visit.    No Known Allergies   DIAGNOSTIC DATA (LABS, IMAGING, TESTING) - I reviewed patient records, labs, notes, testing and imaging myself where available.  Lab Results  Component Value Date   WBC 8.9 04/13/2023   HGB 15.0 04/13/2023   HCT 44.7 04/13/2023   MCV 89.2 04/13/2023   PLT 242 04/13/2023      Component Value Date/Time   NA 141 04/13/2023 1336   K 4.4 04/13/2023 1336   CL 99 04/13/2023 1336   CO2 32 04/13/2023 1336   GLUCOSE 88 04/13/2023 1336   BUN 37 (H) 04/13/2023 1336   CREATININE 1.60 (H) 04/13/2023 1336   CALCIUM 9.7 04/13/2023 1336   PROT 7.5 04/13/2023 1336   PROT 6.8 05/27/2021 1332   AST 27 04/13/2023 1336   ALT 29 04/13/2023 1336   BILITOT 1.0 04/13/2023 1336   GFRNONAA 66 08/21/2020 1233   GFRAA 76 08/21/2020 1233   Lab Results  Component Value Date   CHOL 137 04/11/2015   HDL 26 (L) 04/11/2015   LDLCALC 72 04/11/2015   TRIG 194 (H) 04/11/2015   CHOLHDL 5.3 04/11/2015   Lab Results  Component Value Date   HGBA1C 5.8 (H) 05/27/2021   Lab Results  Component Value Date   VITAMINB12 692 05/27/2021   Lab Results  Component Value Date   TSH 2.520 05/27/2021    PHYSICAL EXAM:  Vitals:   09/12/23 1447  BP: 117/62  Pulse: 62   No data found. Body mass index is 40.77 kg/m.    Wt Readings from Last 3 Encounters:  09/12/23 (!) 309 lb (140.2 kg)  07/25/23 298 lb (135.2 kg)  05/06/23 (!) 301 lb (136.5 kg)     Ht Readings from Last 3 Encounters:  09/12/23 6' 1 (1.854 m)  07/25/23 6' 1 (1.854 m)  05/06/23 6' 1 (1.854 m)      General: The patient is awake, alert and appears not in acute distress. The patient is groomed. Head: Normocephalic, atraumatic.  Cardiovascular:  Regular rate and cardiac rhythm by pulse, without distended neck veins. Respiratory: Skin:  ankle edema, leg edema, facial rash/ flush.  Trunk:  obese.     NEUROLOGIC EXAM: The patient is awake and alert, oriented to place and time.   Memory subjective described as intact.  Attention span & concentration ability appears normal.  Speech is fluent,  without  dysarthria, dysphonia or aphasia.  Mood and affect are appropriate.   Cranial nerves: no loss of smell or taste reported  Pupils are equal and briskly reactive to light. Extraocular movements in vertical and horizontal planes were intact and without nystagmus. No Diplopia.   Motor exam:  Symmetric bulk, tone and ROM.   Normal tone without cog wheeling, symmetric grip strength .   Sensory:  Fine touch, pinprick and vibration were tested  and  normal.  Proprioception tested in the upper extremities was normal.   Coordination: Rapid alternating movements in the fingers/hands were  of normal speed.  The Finger-to-nose maneuver was intact without evidence of ataxia, dysmetria or tremor.   Gait and station: Patient could rise unassisted from a seated position, walked without assistive device.   Toe and heel walk were deferred.  Deep tendon reflexes: in the  upper and lower extremities are symmetric and intact.  Babinski response was deferred.    ASSESSMENT AND PLAN :   53 y.o. year old male  here with:    1)  Cardiology at [facility] following for s dextrocardia, inversion, and  for systolic CHF.   2) Painful neuropathy and  edema  related pain in both lower extremities and now ascending neuropathy.  Refilled medication:  lyrica  150 mg  bid po.   3)  to reduce the  high air leak , I offered a memory foam  FFM . He can take the sample home.     RV in 12 months with NP or me  .      I would like to thank Marvine Rush, MD and Marvine Rush, Md [address] [place],  [place] [address] for allowing me to meet with and to take care of this pleasant patient.     The patient's condition requires frequent monitoring and adjustments in the treatment plan, reflecting the ongoing complexity of care.  This provider is the continuing focal point for all needed services for this condition.  After spending a total time of  30  minutes face to face and time for  history taking, physical and neurologic examination, review of laboratory studies,  personal review of imaging studies, reports and results of other testing and review of referral information / records as far as provided in visit,   Electronically signed by: Dedra Gores, MD 09/12/2023 3:18 PM  [facility] and [facility] Sleep Board certified by The [employer] Sleep Medicine and Diplomate of the [employer] of Sleep Medicine. Board certified In Neurology through the ABPN, Fellow of the [employer] of Neu[employer].

## 2023-10-12 DIAGNOSIS — D0359 Melanoma in situ of other part of trunk: Secondary | ICD-10-CM | POA: Diagnosis not present

## 2023-10-12 DIAGNOSIS — L814 Other melanin hyperpigmentation: Secondary | ICD-10-CM | POA: Diagnosis not present

## 2023-10-12 DIAGNOSIS — L570 Actinic keratosis: Secondary | ICD-10-CM | POA: Diagnosis not present

## 2023-10-12 DIAGNOSIS — D239 Other benign neoplasm of skin, unspecified: Secondary | ICD-10-CM | POA: Diagnosis not present

## 2023-10-12 DIAGNOSIS — T148XXA Other injury of unspecified body region, initial encounter: Secondary | ICD-10-CM | POA: Diagnosis not present

## 2023-10-12 DIAGNOSIS — I872 Venous insufficiency (chronic) (peripheral): Secondary | ICD-10-CM | POA: Diagnosis not present

## 2023-10-26 NOTE — Progress Notes (Signed)
 Office Visit Note  Patient: Willie Daniel             Date of Birth: 1971/02/12           MRN: [id number]             PCP: Marvine Rush, MD Referring: Marvine Rush, MD Visit Date: 11/04/2023   Subjective:  Follow-up    Discussed the use of AI scribe software for clinical note transcription with the patient, who gave verbal consent to proceed.  History of Present Illness   Willie Daniel is a 53 y.o. male here for follow up for chronic gouty arthritis on Uloric  120 mg daily with colchicine  and prednisone  as needed.    He is on Febuxostat  120 mg daily for chronic gout and experiences occasional flare-ups, primarily in his toes or his hands. He manages these with colchicine , taking one tablet daily for about three days when a flare-up is imminent that stops almost all episodes from developing. If worse, he takes prednisone , which effectively controls the symptoms but has not needed this in months. His last uric acid level was 6.5.  He has a history of a rotator cuff and labrum issue in his shoulder, confirmed by an MRI at [facility]. Surgery is currently delayed for rescheduling but anticipated later this year.  He sustained a dog bite on his thumb, which was deep but has healed with a scar. There were no subsequent complications.  He has a history of congestive heart failure and was advised to stop taking aspirin. There have been no recent changes to his heart failure medications.  He reports a blackened and swollen toe, which occurred about three months ago after an incident while helping with yard work. He suspects it might be related to his peripheral neuropathy, as he did not feel any injury at the time. The toe remains slightly discolored and more swollen but not painful, with limited mobility in the joint.      Previous HPI 05/06/2023 Willie Daniel is a 53 y.o. male here for follow up for chronic gouty arthritis on Uloric  120 mg daily with colchicine  and prednisone  as needed.   Gout has been doing well he reports having 3 or maybe 4 flareups of inflammation e.g. which resolved with colchicine  and taking prednisone  usually 3 days or less.  He had 1 episode of pain and warmth to the touch in his left shoulder that he thinks may have been a gout flare because the pain was very similar.  Besides that his worst problem area is in the right shoulder.  He has consistent difficulty lifting heavily but mostly with overhead or behind the back movements.  Shoulder pain is also aggravated lying on his right side at night.  Pain sometimes radiates into the upper arm.  No significant numbness associated and no radiation to the hand, no increase in neck pain.  He saw orthopedics for this with intra-articular steroid injection and referral to physical therapy but he has not seen a great relief in symptoms so far.  He last saw physical therapy about 2 weeks ago.     Previous HPI 11/04/2022 Willie Daniel is a 53 y.o. male here for follow up for chronic gouty arthritis on Uloric  120 mg daily with colchicine  and prednisone  as needed.  Last substantial flareup was in May involving his left ankle when he called in but even this resolved within about 5 days after starting the colchicine  and took prednisone  a few  days.  Otherwise is not needing any anti-inflammatory medicine on a daily basis and no new large joint effusions or nodules.  He is also working on his diet by cutting out more sugars and increasing his level of exercise.  This is showing benefit with last hemoglobin A1c down to 5.5%.  He had recent labs checked for his primary care office with no significant change in kidney function.   Previous HPI 05/06/2022 Willie Daniel is a 53 y.o. male here for follow up for chronic gouty arthritis on Uloric  dose was increased to 120 mg daily after last visit in November.  He has not suffered any major flareups since then or had to take prednisone .  He has had a couple episodes of increased pain and  stiffness but took colchicine  for only 1 or 2 days with improvement.  Had umbilical hernia repair surgery without complications.  He is starting to get back in the gym to work on increasing his exercise with a goal of weight loss.   Previous HPI 02/01/22 Willie Daniel is a 53 y.o. male here for gout follow up on uloric  80 mg daily. Uric acid at last visit was 9.3. Interal BMP with serum creatinine 1.7 slightly worse than before. He has had a few flare ups, most noticeable in the left ankle and afterwards a new nodule on the back of his ankle. This is not particularly tender but does have some skin irritation with friction on his sock and shoe. Also slightly worse trouble tightly closing his left knuckles.   Previous HPI 07/14/21 Willie Daniel is a 53 y.o. male here for follow up for chronic, erosive, tophaceous, polyarticular gout on colchicine  0.6 mg PO daily and febuxostat  80 mg PO daily. Overall he is doing well he continues to experience intermittent flare ups of joint pain but better most of the time. Called in for repeat steroid treatment 4/11. He noticed increased stiffness and catching or locking in his right index finger usually first thing in the morning.   07/15/20 Willie Daniel is a 53 y.o. male here for evaluation of osteoarthritis, gouty arthritis, with chronic left knee pain.  He has had chronic joint pains and episodic gout flares in numerous areas going back more than 10 years with involvement in his feet knees elbows hands and possibly shoulders.  He also has some known osteoarthritis in joints and was recently seen by orthopedic surgery clinic with steroid injection of the right knee and hyaluronic acid supplementation injection of the left knee.  He has had a previous left knee arthroscopy in 2014.  His gout has been problematic for a long time he is seeing [facility] rheumatology for this issue during the past approximately 1.5 years he was titrated to a high dose of allopurinol  with as needed colchicine  use and achieved a uric acid goal of less than 6.  However despite this he has had a more gout pain and inflammation in the past 6 months than ever before.  He was previously recommended against taking prophylactic colchicine  daily with what sounds like some liver or renal function changes on labs and also having chronic diarrhea from the medicine.  For recent flares he received a steroid taper prescription from his PCP that was much more beneficial for the symptoms flare this was around March. He also thinks that trying the colchicine  tablets was more effective than capsules for recent flares. He is concerned about lack of disease control despite good uric acid level  and stopping all alcohol use and red meats and shellfish and very limited soft drink use and wonders if different treatments might be more beneficial.  Review of Systems  Constitutional:  Positive for fatigue.  HENT:  Positive for mouth dryness. Negative for mouth sores.   Eyes:  Negative for dryness.  Respiratory:  Negative for shortness of breath.   Cardiovascular:  Negative for chest pain and palpitations.  Gastrointestinal:  Negative for blood in stool, constipation and diarrhea.  Endocrine: Negative for increased urination.  Genitourinary:  Negative for involuntary urination.  Musculoskeletal:  Positive for joint pain, gait problem, joint pain, joint swelling, myalgias, morning stiffness and myalgias. Negative for muscle weakness and muscle tenderness.  Skin:  Positive for sensitivity to sunlight. Negative for color change, rash and hair loss.  Allergic/Immunologic: Negative for susceptible to infections.  Neurological:  Positive for dizziness. Negative for headaches.  Hematological:  Negative for swollen glands.  Psychiatric/Behavioral:  Negative for depressed mood and sleep disturbance. The patient is nervous/anxious.     PMFS History:  Patient Active Problem List   Diagnosis Date Noted   Bilateral  leg edema 09/12/2023   Impingement syndrome of right shoulder 05/06/2023   Neuropathy 08/27/2020   Bilateral primary osteoarthritis of knee 07/15/2020   Benign essential hypertension 06/19/2020   Chronic gouty arthritis 06/19/2020   Chronic kidney disease 06/19/2020   Hypothyroidism 06/19/2020   Idiopathic peripheral neuropathy 03/04/2020   Chronic systolic CHF (congestive heart failure) (HCC) 01/29/2019   History of cardioversion 07/21/2017   Atrial flutter with rapid ventricular response (HCC) 07/21/2017   AVNRT (AV nodal re-entry tachycardia) (HCC) 05/26/2017   Nocturnal hypoxemia 07/15/2016   OSA on CPAP 07/17/2015   Atrial flutter (HCC) 04/10/2015   L-loop transposition of great arteries 10/12/2011    Past Medical History:  Diagnosis Date   Chronic gouty arthritis    Hypertension    Neuropathy    OSA on CPAP 07/17/2015   Osteoarthritis     Family History  Problem Relation Age of Onset   Multiple myeloma Mother    Healthy Brother    Healthy Brother    Healthy Daughter    Healthy Daughter    Healthy Son    Sleep apnea Neg Hx    Past Surgical History:  Procedure Laterality Date   CARDIAC SURGERY  1976   age 78 and age 59 at [facility]   ELECTROPHYSIOLOGIC STUDY N/A 04/14/2015   Procedure: Cardioversion;  Surgeon: Elspeth JAYSON Sage, MD;  Location: [facility]VASIVE CV LAB;  Service: Cardiovascular;  Laterality: N/A;   HERNIA REPAIR  03/12/2022   I & D EXTREMITY  2004   right leg infection   KNEE ARTHROSCOPY WITH MEDIAL MENISECTOMY Left 12/14/2012   Procedure: KNEE ARTHROSCOPY WITH PARTIAL MEDIAL AND LATERAL MENISECTOMY EXTENSIVE SYNOVECTOMY REMOVAL NODULE LEFT KNEE;  Surgeon: Toribio JULIANNA Chancy, MD;  Location: [facility];  Service: Orthopedics;  Laterality: Left;   TEE WITHOUT CARDIOVERSION N/A 04/14/2015   Procedure: TRANSESOPHAGEAL ECHOCARDIOGRAM (TEE);  Surgeon: Maude JAYSON Emmer, MD;  Location: [facility] END[facility]COPY;  Service: Cardiovascular;  Laterality: N/A;   VASECTOMY  2010    general anesth   Social History   Social History Narrative   Lives at home with wife and kids   Caffeine use: Tea/soda occass (2-3 per week)   Immunization History  Administered Date(s) Administered   PFIZER(Purple Top)SARS-COV-2 Vaccination 06/04/2019   Tdap 07/26/2023     Objective: Vital Signs: BP 95/62 (BP Location: Left Arm, Patient Position:  Sitting, Cuff Size: Normal)   Pulse 79   Resp 16   Ht 6' 1 (1.854 m)   Wt 276 lb 3.2 oz (125.3 kg)   BMI 36.44 kg/m    Physical Exam Constitutional:      Appearance: He is obese.  Eyes:     Conjunctiva/sclera: Conjunctivae normal.  Cardiovascular:     Rate and Rhythm: Normal rate and regular rhythm.  Pulmonary:     Effort: Pulmonary effort is normal.     Breath sounds: Normal breath sounds.  Musculoskeletal:     Comments: Trace bilateral pedal edema  Lymphadenopathy:     Cervical: No cervical adenopathy.  Skin:    General: Skin is warm and dry.  Neurological:     Mental Status: He is alert.  Psychiatric:        Mood and Affect: Mood normal.      Musculoskeletal Exam:  Right shoulder pain and limited ROM with overhead abduction and external rotation, has tenderness to pressure on anterior lateral side of joint with no palpable swelling Elbows full ROM no tenderness, left olecranon bursa swelling Wrists full ROM no tenderness or swelling Fingers full ROM slightly decreased flexion range of motion at left 2nd-3rd MCP joints Knees full ROM no tenderness or swelling, crepitus present Restricted flexion extension of first MTP joint on both feet, right 1st IP joint very limited movement, faint erythema in distal toe with no lesion no warmth   Investigation: No additional findings.  Imaging: No results found.  Recent Labs: Lab Results  Component Value Date   WBC 8.9 04/13/2023   HGB 15.0 04/13/2023   PLT 242 04/13/2023   NA 141 04/13/2023   K 4.4 04/13/2023   CL 99 04/13/2023   CO2 32 04/13/2023   GLUCOSE  88 04/13/2023   BUN 37 (H) 04/13/2023   CREATININE 1.60 (H) 04/13/2023   BILITOT 1.0 04/13/2023   AST 27 04/13/2023   ALT 29 04/13/2023   PROT 7.5 04/13/2023   CALCIUM 9.7 04/13/2023   GFRAA 76 08/21/2020    Speciality Comments: No specialty comments available.  Procedures:  No procedures performed Allergies: Patient has no known allergies.   Assessment / Plan:     Visit Diagnoses: Chronic gouty arthritis - 04/13/2023 Uric Acid 6.5 - Plan: Febuxostat  80 MG TABS, colchicine  0.6 MG tablet Chronic gout with intermittent flares, primarily affecting the hands. Uric acid level was 6.5 at the start of the year, indicating well-controlled gout. No major changes in medication are needed unless there are changes in diuretics for heart medications. - Continue Febuxostat  120 mg daily. - Use colchicine  and prednisone  as needed for flares. - Monitor for changes in diuretics that may affect uric acid levels.  Peripheral neuropathy Peripheral neuropathy with decreased sensation in the feet, increasing the risk of complications such as injuries and deformities.  Right foot and toe deformity with possible injury and neuropathy Right foot and toe deformity with possible injury. The second through fifth toes have a cocked-up anatomy, and there is bony enlargement on the first toe. The right toe turned black and swollen after a possible injury three months ago, with limited mobility in the tip of the toe joint. Differential includes joint destruction or injury. New shoes have caused blisters, indicating a need for a larger toe box. - Refer to podiatry at [facility] for evaluation and management. - Ensure podiatry referral includes peripheral neuropathy and potential injury. - Advise on appropriate footwear to prevent pressure spots.  Trigger finger,  left hand Trigger finger in the left hand, causing stiffness and popping, especially in the morning. No pain reported.        Orders: Orders Placed This  Encounter  Procedures   Ambulatory referral to Podiatry   Meds ordered this encounter  Medications   Febuxostat  80 MG TABS    Sig: Take 1.5 tablets (120 mg total) by mouth daily.    Dispense:  135 tablet    Refill:  1   colchicine  0.6 MG tablet    Sig: Take 1 tablet (0.6 mg total) by mouth as needed.    Dispense:  30 tablet    Refill:  1     Follow-Up Instructions: Return in about 6 months (around 05/05/2024) for Gout on uloric +col/GC PRN f/u 6mos.   Lonni LELON Ester, MD  Note - This record has been created using [employer].  Chart creation errors have been sought, but may not always  have been located. Such creation errors do not reflect on  the standard of medical care.

## 2023-11-04 ENCOUNTER — Encounter: Payer: Self-pay | Admitting: Internal Medicine

## 2023-11-04 ENCOUNTER — Ambulatory Visit: Payer: BC Managed Care – PPO | Attending: Internal Medicine | Admitting: Internal Medicine

## 2023-11-04 VITALS — BP 95/62 | HR 79 | Resp 16 | Ht 73.0 in | Wt 276.2 lb

## 2023-11-04 DIAGNOSIS — M170 Bilateral primary osteoarthritis of knee: Secondary | ICD-10-CM | POA: Diagnosis not present

## 2023-11-04 DIAGNOSIS — G609 Hereditary and idiopathic neuropathy, unspecified: Secondary | ICD-10-CM

## 2023-11-04 DIAGNOSIS — Z5181 Encounter for therapeutic drug level monitoring: Secondary | ICD-10-CM | POA: Diagnosis not present

## 2023-11-04 DIAGNOSIS — M19071 Primary osteoarthritis, right ankle and foot: Secondary | ICD-10-CM

## 2023-11-04 DIAGNOSIS — Z7952 Long term (current) use of systemic steroids: Secondary | ICD-10-CM | POA: Diagnosis not present

## 2023-11-04 DIAGNOSIS — M1A9XX0 Chronic gout, unspecified, without tophus (tophi): Secondary | ICD-10-CM | POA: Diagnosis not present

## 2023-11-04 DIAGNOSIS — M7541 Impingement syndrome of right shoulder: Secondary | ICD-10-CM

## 2023-11-04 MED ORDER — FEBUXOSTAT 80 MG PO TABS: 1.5000 | ORAL_TABLET | Freq: Every day | ORAL | 1 refills | Status: AC

## 2023-11-04 MED ORDER — COLCHICINE 0.6 MG PO TABS: 0.6000 mg | ORAL_TABLET | ORAL | 1 refills | Status: AC | PRN

## 2023-11-08 DIAGNOSIS — G4733 Obstructive sleep apnea (adult) (pediatric): Secondary | ICD-10-CM | POA: Diagnosis not present

## 2023-11-14 DIAGNOSIS — L0100 Impetigo, unspecified: Secondary | ICD-10-CM | POA: Diagnosis not present

## 2023-11-18 DIAGNOSIS — N183 Chronic kidney disease, stage 3 unspecified: Secondary | ICD-10-CM | POA: Diagnosis not present

## 2023-11-18 DIAGNOSIS — Q203 Discordant ventriculoarterial connection: Secondary | ICD-10-CM | POA: Diagnosis not present

## 2023-11-18 DIAGNOSIS — Z01818 Encounter for other preprocedural examination: Secondary | ICD-10-CM | POA: Diagnosis not present

## 2023-11-18 DIAGNOSIS — I5022 Chronic systolic (congestive) heart failure: Secondary | ICD-10-CM | POA: Diagnosis not present

## 2023-11-30 DIAGNOSIS — D0359 Melanoma in situ of other part of trunk: Secondary | ICD-10-CM | POA: Diagnosis not present

## 2023-12-02 DIAGNOSIS — M109 Gout, unspecified: Secondary | ICD-10-CM | POA: Diagnosis not present

## 2023-12-02 DIAGNOSIS — M25811 Other specified joint disorders, right shoulder: Secondary | ICD-10-CM | POA: Diagnosis not present

## 2023-12-02 DIAGNOSIS — M67813 Other specified disorders of tendon, right shoulder: Secondary | ICD-10-CM | POA: Diagnosis not present

## 2023-12-02 DIAGNOSIS — M7551 Bursitis of right shoulder: Secondary | ICD-10-CM | POA: Diagnosis not present

## 2023-12-02 DIAGNOSIS — M75121 Complete rotator cuff tear or rupture of right shoulder, not specified as traumatic: Secondary | ICD-10-CM | POA: Diagnosis not present

## 2023-12-02 DIAGNOSIS — G629 Polyneuropathy, unspecified: Secondary | ICD-10-CM | POA: Diagnosis not present

## 2023-12-02 DIAGNOSIS — M67921 Unspecified disorder of synovium and tendon, right upper arm: Secondary | ICD-10-CM | POA: Diagnosis not present

## 2023-12-02 DIAGNOSIS — S43431A Superior glenoid labrum lesion of right shoulder, initial encounter: Secondary | ICD-10-CM | POA: Diagnosis not present

## 2023-12-02 DIAGNOSIS — I130 Hypertensive heart and chronic kidney disease with heart failure and stage 1 through stage 4 chronic kidney disease, or unspecified chronic kidney disease: Secondary | ICD-10-CM | POA: Diagnosis not present

## 2023-12-02 DIAGNOSIS — Z6839 Body mass index (BMI) 39.0-39.9, adult: Secondary | ICD-10-CM | POA: Diagnosis not present

## 2023-12-02 DIAGNOSIS — G8918 Other acute postprocedural pain: Secondary | ICD-10-CM | POA: Diagnosis not present

## 2023-12-02 DIAGNOSIS — M19011 Primary osteoarthritis, right shoulder: Secondary | ICD-10-CM | POA: Diagnosis not present

## 2023-12-02 DIAGNOSIS — G4733 Obstructive sleep apnea (adult) (pediatric): Secondary | ICD-10-CM | POA: Diagnosis not present

## 2023-12-02 DIAGNOSIS — E039 Hypothyroidism, unspecified: Secondary | ICD-10-CM | POA: Diagnosis not present

## 2023-12-02 DIAGNOSIS — Z79890 Hormone replacement therapy (postmenopausal): Secondary | ICD-10-CM | POA: Diagnosis not present

## 2023-12-02 DIAGNOSIS — N183 Chronic kidney disease, stage 3 unspecified: Secondary | ICD-10-CM | POA: Diagnosis not present

## 2023-12-02 DIAGNOSIS — I5022 Chronic systolic (congestive) heart failure: Secondary | ICD-10-CM | POA: Diagnosis not present

## 2023-12-02 DIAGNOSIS — I482 Chronic atrial fibrillation, unspecified: Secondary | ICD-10-CM | POA: Diagnosis not present

## 2023-12-02 DIAGNOSIS — E66812 Obesity, class 2: Secondary | ICD-10-CM | POA: Diagnosis not present

## 2023-12-02 DIAGNOSIS — M7541 Impingement syndrome of right shoulder: Secondary | ICD-10-CM | POA: Diagnosis not present

## 2023-12-02 DIAGNOSIS — Z79899 Other long term (current) drug therapy: Secondary | ICD-10-CM | POA: Diagnosis not present

## 2023-12-05 DIAGNOSIS — R29898 Other symptoms and signs involving the musculoskeletal system: Secondary | ICD-10-CM | POA: Diagnosis not present

## 2023-12-05 DIAGNOSIS — M25611 Stiffness of right shoulder, not elsewhere classified: Secondary | ICD-10-CM | POA: Diagnosis not present

## 2023-12-05 DIAGNOSIS — Z4789 Encounter for other orthopedic aftercare: Secondary | ICD-10-CM | POA: Diagnosis not present

## 2023-12-05 DIAGNOSIS — M25511 Pain in right shoulder: Secondary | ICD-10-CM | POA: Diagnosis not present

## 2023-12-07 DIAGNOSIS — R29898 Other symptoms and signs involving the musculoskeletal system: Secondary | ICD-10-CM | POA: Diagnosis not present

## 2023-12-07 DIAGNOSIS — Z4789 Encounter for other orthopedic aftercare: Secondary | ICD-10-CM | POA: Diagnosis not present

## 2023-12-07 DIAGNOSIS — M25511 Pain in right shoulder: Secondary | ICD-10-CM | POA: Diagnosis not present

## 2023-12-07 DIAGNOSIS — M25611 Stiffness of right shoulder, not elsewhere classified: Secondary | ICD-10-CM | POA: Diagnosis not present

## 2023-12-09 ENCOUNTER — Other Ambulatory Visit: Payer: Self-pay | Admitting: Internal Medicine

## 2023-12-09 DIAGNOSIS — M1A9XX0 Chronic gout, unspecified, without tophus (tophi): Secondary | ICD-10-CM

## 2023-12-09 NOTE — Telephone Encounter (Signed)
 Last Fill: 02/15/2023  Next Visit: 11/09/2024  Last Visit: 11/04/2023  Dx: Chronic gouty arthritis   Current Dose per office note on 11/04/2023: Use colchicine  and prednisone  as needed for flares.   Okay to refill Prednisone ?

## 2023-12-16 DIAGNOSIS — M25511 Pain in right shoulder: Secondary | ICD-10-CM | POA: Diagnosis not present

## 2023-12-16 DIAGNOSIS — M25611 Stiffness of right shoulder, not elsewhere classified: Secondary | ICD-10-CM | POA: Diagnosis not present

## 2023-12-16 DIAGNOSIS — R29898 Other symptoms and signs involving the musculoskeletal system: Secondary | ICD-10-CM | POA: Diagnosis not present

## 2023-12-16 DIAGNOSIS — Z4789 Encounter for other orthopedic aftercare: Secondary | ICD-10-CM | POA: Diagnosis not present

## 2023-12-16 NOTE — Progress Notes (Signed)
 Department of Rehabilitation Services  Physical Therapy Treatment Adult Musculoskeletal  Patient: Willie Daniel (Preferred name: Willie Daniel, Willie Daniel 04/10/1970, MRN [id number]   Visit Date: 12/16/2023  Visit Number: 3  for current Episode of Care Clinic Location:  [facility] [address] [facility] [place] [address] Dept: [phone] Loc: [phone]  Encounter Diagnoses:    ICD-10-CM  1. Encounter for other orthopedic aftercare  Z47.89  2. Acute pain of right shoulder  M25.511  3. Decreased ROM of right shoulder  M25.611  4. Weakness of right shoulder  R29.898     SUBJECTIVE   Willie Daniel (Preferred name: Aro seen in PT today for Todays Treatment: Right shoulder.   Subjective Report:  The patient's symptoms have  improved.    Doing well, having very little pain, and sleeping without disruption.  Had a good visit with Cal earlier today, sutures removed.       Pain report:   Pain Assessment Pain Assessment %%: 0-10 Pain Score %%: 0-No pain Pain Type: Surgical pain Pain Loc: Shoulder Pain Orientation: Right Pain Descriptors: Tightness Pain Frequency: Intermittent     Falls:  Has the patient fallen since last visit: no  OBJECTIVE and OUTCOME MEASURES        PROMIS CAT - ADULT       07/14/2023 12/04/2023 12/16/2023  ADULT PROMIS CAT  PROMIS Physical Function T-Score 38* 32* 24*  PROMIS Pain Interference T-Score (range: 10 - 90) 63* 73* 60  PROMIS Depression T-Score 42  39    For PROMIS measures, higher scores equals more of the concept being measured.  PROMIS scores have a mean of 50 and standard deviation (SD) of 10. Scores 0.5 - 1.0 SD worse than the mean = mild symptoms/impairment Scores 1.0 - 2.0 SD worse than the mean = moderate symptoms/impairment Scores 2.0 SD or more worse than the mean = severe symptoms/impairment  Interpretation of PROMIS Scores WNL  Mild Symptoms/Impairment Moderate Symptoms/Impairment Severe Symptoms/Impairment  Physical Function (Adult and Pediatric) 45 or greater 40-44 30-39 29 or less  Adult Pain Interference 54 or less 55-59 60-69 70 or greater  Depression 54 or less 55-59 60-69 70 or greater  Sleep Disturbance 54 or less 55-59 60-69 70 or greater  Pediatric or Parent Proxy Pain Interference 50 or less 50-54 55-64 65 or greater  Cognitive Function  45 or greater 40-44  30-39 29 or less    Objective measures taken today (if any):             TREATMENT       Today's treatment included:  Right Shoulder Treatments: PROM, Joint mobilizations, Post-op care Grade I Oscillations Flexion PROM ER PROM Reviewed current HEP: AAROM flexion with well arm, AAROM ER with cane, elbow/wrist/hand exercises   EDUCATION   Patient/Family education: The patient received education regarding Home Exercises by verbal communication, appeared to show willingness to learn and verbalizes understanding    ASSESSMENT     Pain is well controlled.   He demonstrates HEP correctly He requires some cues to avoid guarding.      PLAN   The following plan for future treatment was agreed upon by the patient and/or family.    Plan: Continue at a frequency of 2x/week  Current POC (if applicable) through:   Last Progress note/Goal Update: Yes (12/05/2023 12:04 AM)   Plan for next Visit: Consider decrease to 1x/week if doing well next week (  Pt commutes 1 hour to PT)  Billing Information: Visit Date: 12/16/2023 Date of Onset: 12/02/23 Visit Number: 3 Session Start:: 1530 Session Stop:: 1600 Total Time: 30 minutes        Therapeutic Procedure CPT 97110 : 10 minutes Manual Therapy CPT 97140: 20 minutes                    Note: Per the evaluating physical therapist's plan of care, if patient does not return for follow up visit(s) related to this episode of care, this note will serve as their discharge note from physical  therapy.  If patient returns to clinic with variance in plan of care, then it may be attributable to one or more of the following factors: preferred clinician availability, appointment time request availability, therapy pool appointment availability, major holiday with clinic closure, care partner availability, patient transportation, conflicting medical appointment, inclement weather, patient illness, and/or scheduling error.  ___________________________________________________                                 Clinical Notes on MyChart: Progress notes documented by your healthcare team will now be available on the MyChart portal.  We believe that patients should be a part of the healthcare team.  We encourage you to review notes after visits and in preparation for upcoming appointments.  This provides the opportunity to review recommendations as well as to prepare questions for your healthcare team to address during your next visit.  If you identify discrepancies in the documentation or have specific questions related to the notes, please bring them to your next scheduled visit to discuss with your Physical Therapist (PT).  With increased transparency, our hope is that we create more trust, better communication, more shared decision-making, and increased satisfaction. Please be aware that these notes will not be discussed over the phone or through My Chart messages.  They will be discussed only at your next office visit with your provider.  *Some images could not be shown.

## 2023-12-18 ENCOUNTER — Other Ambulatory Visit: Payer: Self-pay

## 2023-12-18 ENCOUNTER — Emergency Department (HOSPITAL_COMMUNITY)
Admission: EM | Admit: 2023-12-18 | Discharge: 2023-12-18 | Disposition: A | Discharge status: Home / Self Care | Attending: Emergency Medicine | Admitting: Emergency Medicine

## 2023-12-18 ENCOUNTER — Encounter (HOSPITAL_COMMUNITY): Payer: Self-pay | Admitting: Emergency Medicine

## 2023-12-18 ENCOUNTER — Emergency Department (HOSPITAL_COMMUNITY)

## 2023-12-18 DIAGNOSIS — I110 Hypertensive heart disease with heart failure: Secondary | ICD-10-CM | POA: Insufficient documentation

## 2023-12-18 DIAGNOSIS — Z7901 Long term (current) use of anticoagulants: Secondary | ICD-10-CM | POA: Diagnosis not present

## 2023-12-18 DIAGNOSIS — I509 Heart failure, unspecified: Secondary | ICD-10-CM | POA: Diagnosis not present

## 2023-12-18 DIAGNOSIS — I4892 Unspecified atrial flutter: Secondary | ICD-10-CM | POA: Insufficient documentation

## 2023-12-18 DIAGNOSIS — R002 Palpitations: Secondary | ICD-10-CM | POA: Diagnosis present

## 2023-12-18 HISTORY — DX: Heart failure, unspecified: I50.9

## 2023-12-18 LAB — CBC WITH DIFFERENTIAL/PLATELET
Abs Immature Granulocytes: 0.02 K/uL (ref 0.00–0.07)
Basophils Absolute: 0.1 K/uL (ref 0.0–0.1)
Basophils Relative: 1 %
Eosinophils Absolute: 0.2 K/uL (ref 0.0–0.5)
Eosinophils Relative: 3 %
HCT: 48.3 % (ref 39.0–52.0)
Hemoglobin: 15.6 g/dL (ref 13.0–17.0)
Immature Granulocytes: 0 %
Lymphocytes Relative: 37 %
Lymphs Abs: 3.1 K/uL (ref 0.7–4.0)
MCH: 30 pg (ref 26.0–34.0)
MCHC: 32.3 g/dL (ref 30.0–36.0)
MCV: 92.9 fL (ref 80.0–100.0)
Monocytes Absolute: 0.5 K/uL (ref 0.1–1.0)
Monocytes Relative: 6 %
Neutro Abs: 4.4 K/uL (ref 1.7–7.7)
Neutrophils Relative %: 53 %
Platelets: 264 K/uL (ref 150–400)
RBC: 5.2 MIL/uL (ref 4.22–5.81)
RDW: 13.5 % (ref 11.5–15.5)
WBC: 8.2 K/uL (ref 4.0–10.5)
nRBC: 0 % (ref 0.0–0.2)

## 2023-12-18 LAB — I-STAT CHEM 8, ED
BUN: 29 mg/dL — ABNORMAL HIGH (ref 6–20)
Calcium, Ion: 1.1 mmol/L — ABNORMAL LOW (ref 1.15–1.40)
Chloride: 104 mmol/L (ref 98–111)
Creatinine, Ser: 1.5 mg/dL — ABNORMAL HIGH (ref 0.61–1.24)
Glucose, Bld: 123 mg/dL — ABNORMAL HIGH (ref 70–99)
HCT: 47 % (ref 39.0–52.0)
Hemoglobin: 16 g/dL (ref 13.0–17.0)
Potassium: 3.3 mmol/L — ABNORMAL LOW (ref 3.5–5.1)
Sodium: 142 mmol/L (ref 135–145)
TCO2: 28 mmol/L (ref 22–32)

## 2023-12-18 LAB — COMPREHENSIVE METABOLIC PANEL WITH GFR
ALT: 22 U/L (ref 0–44)
AST: 27 U/L (ref 15–41)
Albumin: 4.2 g/dL (ref 3.5–5.0)
Alkaline Phosphatase: 64 U/L (ref 38–126)
Anion gap: 12 (ref 5–15)
BUN: 24 mg/dL — ABNORMAL HIGH (ref 6–20)
CO2: 28 mmol/L (ref 22–32)
Calcium: 9.4 mg/dL (ref 8.9–10.3)
Chloride: 100 mmol/L (ref 98–111)
Creatinine, Ser: 1.35 mg/dL — ABNORMAL HIGH (ref 0.61–1.24)
GFR, Estimated: >60 mL/min (ref >60)
Glucose, Bld: 128 mg/dL — ABNORMAL HIGH (ref 70–99)
Potassium: 3.4 mmol/L — ABNORMAL LOW (ref 3.5–5.1)
Sodium: 140 mmol/L (ref 135–145)
Total Bilirubin: 1 mg/dL (ref 0.0–1.2)
Total Protein: 7.7 g/dL (ref 6.5–8.1)

## 2023-12-18 LAB — TROPONIN I (HIGH SENSITIVITY)
Troponin I (High Sensitivity): 13 ng/L (ref <18)
Troponin I (High Sensitivity): 35 ng/L — ABNORMAL HIGH (ref <18)

## 2023-12-18 LAB — MAGNESIUM: Magnesium: 2.8 mg/dL — ABNORMAL HIGH (ref 1.7–2.4)

## 2023-12-18 MED ORDER — METOPROLOL TARTRATE 5 MG/5ML IV SOLN
5.0000 mg | Freq: Once | INTRAVENOUS | Status: AC
  Administered 2023-12-18: 5 mg via INTRAVENOUS
  Filled 2023-12-18: qty 5

## 2023-12-18 MED ORDER — SODIUM CHLORIDE 0.9 % IV BOLUS
500.0000 mL | Freq: Once | INTRAVENOUS | Status: AC
  Administered 2023-12-18: 500 mL via INTRAVENOUS

## 2023-12-18 MED ORDER — ETOMIDATE 2 MG/ML IV SOLN: 10.0000 mg | Freq: Once | INTRAVENOUS | Status: DC

## 2023-12-18 NOTE — ED Provider Notes (Signed)
 Emergency Department Provider Note   I have reviewed the triage vital signs and the nursing notes.   HISTORY  Chief Complaint Tachycardia   HPI Willie Daniel is a 53 y.o. male with past medical history of atrial flutter on anticoagulation and congestive heart failure presents to the emergency department with acute onset heart palpitations and fatigue.  No chest discomfort.  He has been compliant with all medications including his anticoagulation. No syncope.    Past Medical History:  Diagnosis Date   CHF (congestive heart failure) (HCC)    Chronic gouty arthritis    Hypertension    Neuropathy    OSA on CPAP 07/17/2015   Osteoarthritis     Review of Systems  Constitutional: No fever/chills Cardiovascular: Denies chest pain. Positive palpitations.  Respiratory: Mild shortness of breath. Gastrointestinal: No abdominal pain.  No nausea, no vomiting.   Musculoskeletal: Negative for back pain. Skin: Negative for rash. Neurological: Negative for headaches.   ____________________________________________   PHYSICAL EXAM:  VITAL SIGNS: ED Triage Vitals  Encounter Vitals Group     BP 12/18/23 0154 126/87     Pulse Rate 12/18/23 0154 (!) 167     Resp 12/18/23 0154 19     Temp 12/18/23 0154 97.8 F (36.6 C)     Temp src --      SpO2 12/18/23 0154 92 %   Constitutional: Alert and oriented. Well appearing and in no acute distress. Eyes: Conjunctivae are normal.  Head: Atraumatic. Nose: No congestion/rhinnorhea. Mouth/Throat: Mucous membranes are moist.   Neck: No stridor.  Cardiovascular: Tachycardia. Good peripheral circulation. Grossly normal heart sounds.   Respiratory: Normal respiratory effort.  No retractions. Lungs CTAB. Gastrointestinal: Soft and nontender. No distention.  Musculoskeletal:  No gross deformities of extremities. Neurologic:  Normal speech and language. Skin:  Skin is warm, dry and intact. No rash  noted.   ____________________________________________   LABS (all labs ordered are listed, but only abnormal results are displayed)  Labs Reviewed  COMPREHENSIVE METABOLIC PANEL WITH GFR - Abnormal; Notable for the following components:      Result Value   Potassium 3.4 (*)    Glucose, Bld 128 (*)    BUN 24 (*)    Creatinine, Ser 1.35 (*)    All other components within normal limits  MAGNESIUM - Abnormal; Notable for the following components:   Magnesium 2.8 (*)    All other components within normal limits  I-STAT CHEM 8, ED - Abnormal; Notable for the following components:   Potassium 3.3 (*)    BUN 29 (*)    Creatinine, Ser 1.50 (*)    Glucose, Bld 123 (*)    Calcium, Ion 1.10 (*)    All other components within normal limits  TROPONIN I (HIGH SENSITIVITY) - Abnormal; Notable for the following components:   Troponin I (High Sensitivity) 35 (*)    All other components within normal limits  CBC WITH DIFFERENTIAL/PLATELET  TROPONIN I (HIGH SENSITIVITY)   ____________________________________________  EKG   EKG Interpretation Date/Time:  Sunday December 18 2023 03:18:10 EDT Ventricular Rate:  69 PR Interval:  157 QRS Duration:  116 QT Interval:  403 QTC Calculation: 432 R Axis:   79  Text Interpretation: Sinus rhythm Ventricular premature complex Nonspecific intraventricular conduction delay Probable lateral infarct, age indeterminate Confirmed by Darra Chew [phone]) on 12/18/2023 3:20:41 AM        ____________________________________________  RADIOLOGY  DG Chest Portable 1 View Result Date: 12/18/2023 CLINICAL DATA:  Cardiac  palpitations EXAM: PORTABLE CHEST 1 VIEW COMPARISON:  10/25/2017 FINDINGS: The heart size and mediastinal contours are within normal limits. Both lungs are clear. The visualized skeletal structures are unremarkable. IMPRESSION: No active disease. Electronically Signed   By: Oneil Devonshire M.D.   On: 12/18/2023 02:33     ____________________________________________   PROCEDURES  Procedure(s) performed:   Procedures  None  ____________________________________________   INITIAL IMPRESSION / ASSESSMENT AND PLAN / ED COURSE  Pertinent labs & imaging results that were available during my care of the patient were reviewed by me and considered in my medical decision making (see chart for details).   This patient is Presenting for Evaluation of palpitations, which does require a range of treatment options, and is a complaint that involves a high risk of morbidity and mortality.  The Differential Diagnoses includes but is not exclusive to acute coronary syndrome, aortic dissection, pulmonary embolism, cardiac tamponade, community-acquired pneumonia, pericarditis, musculoskeletal chest wall pain, etc.   Critical Interventions-    Medications  metoprolol  tartrate (LOPRESSOR ) injection 5 mg (5 mg Intravenous Given 12/18/23 0218)  sodium chloride  0.9 % bolus 500 mL (0 mLs Intravenous Stopped 12/18/23 0334)  metoprolol  tartrate (LOPRESSOR ) injection 5 mg (5 mg Intravenous Given 12/18/23 0256)    Reassessment after intervention:  patient spontaneous converted to NSR.   I decided to review pertinent External Data, and in summary patient with moderate systolic CHF from notes at [facility].   Clinical Laboratory Tests Ordered, included Troponin minimally elevated < 40. Likely demand ischemia from tachycardia. No active CP or ST changes on EKG. CBC without anemia. No AKI.   Radiologic Tests Ordered, included CXR. I independently interpreted the images and agree with radiology interpretation.   Cardiac Monitor Tracing which shows A flutter with RVR.   Social Determinants of Health Risk patient is a non-smoker.    Medical Decision Making: Summary:  The patient presents emergency department with heart palpitations.  He is awake and alert.  He has known history of a flutter and is anticoagulated.  No  hypotension.  Plan for metoprolol , IV fluids, screening blood work and reassess.  Reevaluation with update and discussion with patient at 03:20 AM. He spontaneous converted to NSR. Symptoms improved.   Considered admission but patient converted to NSR. No symptoms currently. Stable for discharge.   Patient's presentation is most consistent with acute presentation with potential threat to life or bodily function.   Disposition: discharge  ____________________________________________  FINAL CLINICAL IMPRESSION(S) / ED DIAGNOSES  Final diagnoses:  Atrial flutter with rapid ventricular response (HCC)    Note:  This document was prepared using Dragon voice recognition software and may include unintentional dictation errors.  Fonda Law, MD, [facility]    Eloise Mula, Fonda MATSU, MD 12/19/23 MIKI

## 2023-12-18 NOTE — Discharge Instructions (Signed)
 Please call your Cardiology doctors on Monday. Return with any new or suddenly worsening symptoms.

## 2023-12-18 NOTE — ED Triage Notes (Signed)
 Pt states My heart rate has been about 170 for an hour

## 2023-12-20 DIAGNOSIS — M25611 Stiffness of right shoulder, not elsewhere classified: Secondary | ICD-10-CM | POA: Diagnosis not present

## 2023-12-20 DIAGNOSIS — Z4789 Encounter for other orthopedic aftercare: Secondary | ICD-10-CM | POA: Diagnosis not present

## 2023-12-20 DIAGNOSIS — R29898 Other symptoms and signs involving the musculoskeletal system: Secondary | ICD-10-CM | POA: Diagnosis not present

## 2023-12-20 DIAGNOSIS — M25511 Pain in right shoulder: Secondary | ICD-10-CM | POA: Diagnosis not present

## 2023-12-23 DIAGNOSIS — M25611 Stiffness of right shoulder, not elsewhere classified: Secondary | ICD-10-CM | POA: Diagnosis not present

## 2023-12-23 DIAGNOSIS — M25511 Pain in right shoulder: Secondary | ICD-10-CM | POA: Diagnosis not present

## 2023-12-23 DIAGNOSIS — R29898 Other symptoms and signs involving the musculoskeletal system: Secondary | ICD-10-CM | POA: Diagnosis not present

## 2023-12-23 DIAGNOSIS — Z4789 Encounter for other orthopedic aftercare: Secondary | ICD-10-CM | POA: Diagnosis not present

## 2023-12-30 DIAGNOSIS — R29898 Other symptoms and signs involving the musculoskeletal system: Secondary | ICD-10-CM | POA: Diagnosis not present

## 2023-12-30 DIAGNOSIS — M25611 Stiffness of right shoulder, not elsewhere classified: Secondary | ICD-10-CM | POA: Diagnosis not present

## 2023-12-30 DIAGNOSIS — M25511 Pain in right shoulder: Secondary | ICD-10-CM | POA: Diagnosis not present

## 2023-12-30 DIAGNOSIS — Z4789 Encounter for other orthopedic aftercare: Secondary | ICD-10-CM | POA: Diagnosis not present

## 2024-01-04 DIAGNOSIS — Z4789 Encounter for other orthopedic aftercare: Secondary | ICD-10-CM | POA: Diagnosis not present

## 2024-01-04 DIAGNOSIS — M25511 Pain in right shoulder: Secondary | ICD-10-CM | POA: Diagnosis not present

## 2024-01-04 DIAGNOSIS — M25611 Stiffness of right shoulder, not elsewhere classified: Secondary | ICD-10-CM | POA: Diagnosis not present

## 2024-01-04 DIAGNOSIS — R29898 Other symptoms and signs involving the musculoskeletal system: Secondary | ICD-10-CM | POA: Diagnosis not present

## 2024-01-11 DIAGNOSIS — Z4789 Encounter for other orthopedic aftercare: Secondary | ICD-10-CM | POA: Diagnosis not present

## 2024-01-11 DIAGNOSIS — M25511 Pain in right shoulder: Secondary | ICD-10-CM | POA: Diagnosis not present

## 2024-01-11 DIAGNOSIS — M25611 Stiffness of right shoulder, not elsewhere classified: Secondary | ICD-10-CM | POA: Diagnosis not present

## 2024-01-11 DIAGNOSIS — R29898 Other symptoms and signs involving the musculoskeletal system: Secondary | ICD-10-CM | POA: Diagnosis not present

## 2024-01-17 DIAGNOSIS — M25511 Pain in right shoulder: Secondary | ICD-10-CM | POA: Diagnosis not present

## 2024-01-17 DIAGNOSIS — R29898 Other symptoms and signs involving the musculoskeletal system: Secondary | ICD-10-CM | POA: Diagnosis not present

## 2024-01-17 DIAGNOSIS — Z4789 Encounter for other orthopedic aftercare: Secondary | ICD-10-CM | POA: Diagnosis not present

## 2024-01-17 DIAGNOSIS — M25611 Stiffness of right shoulder, not elsewhere classified: Secondary | ICD-10-CM | POA: Diagnosis not present

## 2024-01-25 DIAGNOSIS — M25511 Pain in right shoulder: Secondary | ICD-10-CM | POA: Diagnosis not present

## 2024-01-25 DIAGNOSIS — Z4789 Encounter for other orthopedic aftercare: Secondary | ICD-10-CM | POA: Diagnosis not present

## 2024-01-25 DIAGNOSIS — M25611 Stiffness of right shoulder, not elsewhere classified: Secondary | ICD-10-CM | POA: Diagnosis not present

## 2024-01-25 DIAGNOSIS — R29898 Other symptoms and signs involving the musculoskeletal system: Secondary | ICD-10-CM | POA: Diagnosis not present

## 2024-02-09 DIAGNOSIS — Z4789 Encounter for other orthopedic aftercare: Secondary | ICD-10-CM | POA: Diagnosis not present

## 2024-02-09 DIAGNOSIS — M25511 Pain in right shoulder: Secondary | ICD-10-CM | POA: Diagnosis not present

## 2024-02-09 DIAGNOSIS — R29898 Other symptoms and signs involving the musculoskeletal system: Secondary | ICD-10-CM | POA: Diagnosis not present

## 2024-02-09 DIAGNOSIS — M25611 Stiffness of right shoulder, not elsewhere classified: Secondary | ICD-10-CM | POA: Diagnosis not present

## 2024-02-16 DIAGNOSIS — M25611 Stiffness of right shoulder, not elsewhere classified: Secondary | ICD-10-CM | POA: Diagnosis not present

## 2024-02-16 DIAGNOSIS — M25511 Pain in right shoulder: Secondary | ICD-10-CM | POA: Diagnosis not present

## 2024-02-16 DIAGNOSIS — Z4789 Encounter for other orthopedic aftercare: Secondary | ICD-10-CM | POA: Diagnosis not present

## 2024-02-16 DIAGNOSIS — R29898 Other symptoms and signs involving the musculoskeletal system: Secondary | ICD-10-CM | POA: Diagnosis not present

## 2024-02-20 DIAGNOSIS — M25511 Pain in right shoulder: Secondary | ICD-10-CM | POA: Diagnosis not present

## 2024-02-20 DIAGNOSIS — R29898 Other symptoms and signs involving the musculoskeletal system: Secondary | ICD-10-CM | POA: Diagnosis not present

## 2024-02-20 DIAGNOSIS — Z4789 Encounter for other orthopedic aftercare: Secondary | ICD-10-CM | POA: Diagnosis not present

## 2024-02-20 DIAGNOSIS — M25611 Stiffness of right shoulder, not elsewhere classified: Secondary | ICD-10-CM | POA: Diagnosis not present

## 2024-02-28 DIAGNOSIS — G603 Idiopathic progressive neuropathy: Secondary | ICD-10-CM | POA: Diagnosis not present

## 2024-09-17 ENCOUNTER — Ambulatory Visit: Admitting: Neurology

## 2024-11-09 ENCOUNTER — Ambulatory Visit: Admitting: Internal Medicine
